# Patient Record
Sex: Female | Born: 1975 | Race: White | Hispanic: No | Marital: Married | Smoking: Former smoker
Health system: Southern US, Community
[De-identification: ages and names within clinical notes are randomized; demographics above are authoritative.]

## PROBLEM LIST (undated history)

## (undated) DIAGNOSIS — E559 Vitamin D deficiency, unspecified: Secondary | ICD-10-CM

## (undated) DIAGNOSIS — M1990 Unspecified osteoarthritis, unspecified site: Secondary | ICD-10-CM

## (undated) DIAGNOSIS — E785 Hyperlipidemia, unspecified: Secondary | ICD-10-CM

## (undated) DIAGNOSIS — G4730 Sleep apnea, unspecified: Secondary | ICD-10-CM

## (undated) DIAGNOSIS — R112 Nausea with vomiting, unspecified: Secondary | ICD-10-CM

## (undated) DIAGNOSIS — Z91018 Allergy to other foods: Secondary | ICD-10-CM

## (undated) DIAGNOSIS — M25569 Pain in unspecified knee: Secondary | ICD-10-CM

## (undated) DIAGNOSIS — T4145XA Adverse effect of unspecified anesthetic, initial encounter: Secondary | ICD-10-CM

## (undated) DIAGNOSIS — F419 Anxiety disorder, unspecified: Secondary | ICD-10-CM

## (undated) DIAGNOSIS — E119 Type 2 diabetes mellitus without complications: Secondary | ICD-10-CM

## (undated) DIAGNOSIS — N3281 Overactive bladder: Secondary | ICD-10-CM

## (undated) DIAGNOSIS — Z9889 Other specified postprocedural states: Secondary | ICD-10-CM

## (undated) DIAGNOSIS — I10 Essential (primary) hypertension: Secondary | ICD-10-CM

## (undated) DIAGNOSIS — F32A Depression, unspecified: Secondary | ICD-10-CM

## (undated) DIAGNOSIS — T8859XA Other complications of anesthesia, initial encounter: Secondary | ICD-10-CM

## (undated) DIAGNOSIS — F909 Attention-deficit hyperactivity disorder, unspecified type: Secondary | ICD-10-CM

## (undated) DIAGNOSIS — M2550 Pain in unspecified joint: Secondary | ICD-10-CM

## (undated) DIAGNOSIS — M549 Dorsalgia, unspecified: Secondary | ICD-10-CM

## (undated) DIAGNOSIS — F329 Major depressive disorder, single episode, unspecified: Secondary | ICD-10-CM

## (undated) DIAGNOSIS — K219 Gastro-esophageal reflux disease without esophagitis: Secondary | ICD-10-CM

## (undated) DIAGNOSIS — R002 Palpitations: Secondary | ICD-10-CM

## (undated) HISTORY — DX: Dorsalgia, unspecified: M54.9

## (undated) HISTORY — DX: Hyperlipidemia, unspecified: E78.5

## (undated) HISTORY — DX: Essential (primary) hypertension: I10

## (undated) HISTORY — DX: Type 2 diabetes mellitus without complications: E11.9

## (undated) HISTORY — DX: Pain in unspecified joint: M25.50

## (undated) HISTORY — PX: COLONOSCOPY: SHX174

## (undated) HISTORY — DX: Palpitations: R00.2

## (undated) HISTORY — DX: Pain in unspecified knee: M25.569

## (undated) HISTORY — PX: CHOLECYSTECTOMY: SHX55

## (undated) HISTORY — DX: Unspecified osteoarthritis, unspecified site: M19.90

## (undated) HISTORY — DX: Gastro-esophageal reflux disease without esophagitis: K21.9

## (undated) HISTORY — DX: Attention-deficit hyperactivity disorder, unspecified type: F90.9

## (undated) HISTORY — DX: Allergy to other foods: Z91.018

## (undated) HISTORY — PX: LAPAROSCOPY: SHX197

## (undated) HISTORY — DX: Sleep apnea, unspecified: G47.30

## (undated) HISTORY — DX: Vitamin D deficiency, unspecified: E55.9

## (undated) HISTORY — PX: OTHER SURGICAL HISTORY: SHX169

---

## 1997-12-31 ENCOUNTER — Other Ambulatory Visit: Admission: RE | Admit: 1997-12-31 | Discharge: 1997-12-31 | Payer: Self-pay | Admitting: Internal Medicine

## 1998-12-14 ENCOUNTER — Other Ambulatory Visit: Admission: RE | Admit: 1998-12-14 | Discharge: 1998-12-14 | Payer: Self-pay | Admitting: Internal Medicine

## 1999-04-29 ENCOUNTER — Other Ambulatory Visit: Admission: RE | Admit: 1999-04-29 | Discharge: 1999-04-29 | Payer: Self-pay | Admitting: Obstetrics & Gynecology

## 1999-04-29 ENCOUNTER — Encounter (INDEPENDENT_AMBULATORY_CARE_PROVIDER_SITE_OTHER): Payer: Self-pay

## 1999-06-17 ENCOUNTER — Ambulatory Visit (HOSPITAL_COMMUNITY): Admission: RE | Admit: 1999-06-17 | Discharge: 1999-06-17 | Payer: Self-pay | Admitting: Obstetrics & Gynecology

## 1999-06-17 ENCOUNTER — Encounter (INDEPENDENT_AMBULATORY_CARE_PROVIDER_SITE_OTHER): Payer: Self-pay

## 1999-12-16 ENCOUNTER — Other Ambulatory Visit: Admission: RE | Admit: 1999-12-16 | Discharge: 1999-12-16 | Payer: Self-pay | Admitting: Obstetrics & Gynecology

## 2000-12-20 ENCOUNTER — Other Ambulatory Visit: Admission: RE | Admit: 2000-12-20 | Discharge: 2000-12-20 | Payer: Self-pay | Admitting: Obstetrics & Gynecology

## 2003-02-26 ENCOUNTER — Encounter: Payer: Self-pay | Admitting: Obstetrics & Gynecology

## 2003-02-26 ENCOUNTER — Ambulatory Visit (HOSPITAL_COMMUNITY): Admission: RE | Admit: 2003-02-26 | Discharge: 2003-02-26 | Payer: Self-pay | Admitting: Obstetrics & Gynecology

## 2003-04-14 ENCOUNTER — Other Ambulatory Visit: Admission: RE | Admit: 2003-04-14 | Discharge: 2003-04-14 | Payer: Self-pay | Admitting: Obstetrics and Gynecology

## 2004-12-29 ENCOUNTER — Ambulatory Visit: Payer: Self-pay | Admitting: Family Medicine

## 2005-03-31 ENCOUNTER — Ambulatory Visit: Payer: Self-pay | Admitting: Family Medicine

## 2005-05-03 ENCOUNTER — Ambulatory Visit: Payer: Self-pay | Admitting: Family Medicine

## 2005-07-07 ENCOUNTER — Ambulatory Visit: Payer: Self-pay | Admitting: Family Medicine

## 2005-10-11 ENCOUNTER — Ambulatory Visit: Payer: Self-pay | Admitting: Family Medicine

## 2005-10-17 ENCOUNTER — Ambulatory Visit: Payer: Self-pay | Admitting: Family Medicine

## 2006-02-14 ENCOUNTER — Ambulatory Visit: Payer: Self-pay | Admitting: Family Medicine

## 2006-03-22 ENCOUNTER — Ambulatory Visit: Payer: Self-pay | Admitting: Family Medicine

## 2006-07-19 ENCOUNTER — Ambulatory Visit (HOSPITAL_COMMUNITY): Admission: RE | Admit: 2006-07-19 | Discharge: 2006-07-19 | Payer: Self-pay | Admitting: Family Medicine

## 2006-08-23 ENCOUNTER — Ambulatory Visit: Payer: Self-pay | Admitting: Family Medicine

## 2006-11-29 ENCOUNTER — Ambulatory Visit: Payer: Self-pay | Admitting: Family Medicine

## 2007-01-31 ENCOUNTER — Ambulatory Visit (HOSPITAL_COMMUNITY): Admission: RE | Admit: 2007-01-31 | Discharge: 2007-01-31 | Payer: Self-pay | Admitting: Family Medicine

## 2007-08-08 ENCOUNTER — Ambulatory Visit (HOSPITAL_COMMUNITY): Admission: RE | Admit: 2007-08-08 | Discharge: 2007-08-08 | Payer: Self-pay | Admitting: Family Medicine

## 2007-10-03 ENCOUNTER — Ambulatory Visit (HOSPITAL_COMMUNITY): Admission: RE | Admit: 2007-10-03 | Discharge: 2007-10-03 | Payer: Self-pay | Admitting: *Deleted

## 2008-01-23 ENCOUNTER — Ambulatory Visit (HOSPITAL_COMMUNITY): Admission: RE | Admit: 2008-01-23 | Discharge: 2008-01-23 | Payer: Self-pay | Admitting: Podiatry

## 2009-01-08 ENCOUNTER — Encounter: Admission: RE | Admit: 2009-01-08 | Discharge: 2009-01-08 | Payer: Self-pay | Admitting: Orthopaedic Surgery

## 2009-01-27 ENCOUNTER — Encounter: Admission: RE | Admit: 2009-01-27 | Discharge: 2009-04-27 | Payer: Self-pay | Admitting: Orthopaedic Surgery

## 2009-02-14 ENCOUNTER — Emergency Department (HOSPITAL_BASED_OUTPATIENT_CLINIC_OR_DEPARTMENT_OTHER): Admission: EM | Admit: 2009-02-14 | Discharge: 2009-02-14 | Payer: Self-pay | Admitting: Emergency Medicine

## 2009-02-16 ENCOUNTER — Emergency Department (HOSPITAL_BASED_OUTPATIENT_CLINIC_OR_DEPARTMENT_OTHER): Admission: EM | Admit: 2009-02-16 | Discharge: 2009-02-16 | Payer: Self-pay | Admitting: Emergency Medicine

## 2010-08-22 ENCOUNTER — Encounter: Payer: Self-pay | Admitting: Family Medicine

## 2010-11-07 LAB — GLUCOSE, CAPILLARY: Glucose-Capillary: 133 mg/dL — ABNORMAL HIGH (ref 70–99)

## 2010-12-14 NOTE — Op Note (Signed)
NAMEMarland Rogers  CHARIKA, MIKELSON                ACCOUNT NO.:  [id number]   MEDICAL RECORD NO.:  [id number]          PATIENT TYPE:  AMB   LOCATION:  SDC                           FACILITY:  [facility]   PHYSICIAN:  Vaughn B. Earlene Plater, M.D.  DATE OF BIRTH:  1975/12/03   DATE OF PROCEDURE:  10/03/2007  DATE OF DISCHARGE:                               OPERATIVE REPORT   PREOPERATIVE DIAGNOSIS:  Pelvic pain, dysmenorrhea.   POSTOPERATIVE DIAGNOSIS:  Pelvic pain, dysmenorrhea.   PROCEDURE:  Diagnostic laparoscopy.   SURGEON:  Marina Gravel, M.D.   ANESTHESIA:  General.   FINDINGS:  Normal-appearing pelvis and upper abdomen except one  questionable old implant in the posterior cul-de-sac.   SPECIMENS:  None.   BLOOD LOSS:  Minimal.   COMPLICATIONS:  None.   INDICATIONS:  Patient with above history requesting surgical diagnosis  and potential treatment if endometriosis is found.  The patient advised  of the risks of surgery include infection, bleeding, damage to  surrounding organs.   PROCEDURE:  The patient is the operating room and general anesthesia  obtained.  She is prepped and draped in standard fashion.  Foley placed  in the bladder.  Hulka tenaculum attached to the anterior lip of the  cervix.   10 mm incision placed in umbilicus and carried sharply to the fascia.  The fascia was divided sharply and elevated.  Posterior sheath and  peritoneum were elevated and entered sharply.  Pursestring suture of 0-0  Vicryl placed around the fascial defect.  Hasson cannula inserted and  secured.  Pneumoperitoneum obtained with CO2 gas.  Trendelenburg  position obtained.  5 mm trocar placed in the left lower quadrant under  direct laparoscopic visualization.  Bowel was mobilized superiorly.  Abdomen and pelvis inspected with above findings noted.  The tubes  appeared normal as did the fimbria.  The ovaries were normal.  Anterior  and posterior cul-de-sac were normal other than one questionable old  scarred implant of endometriosis in the posterior cul-de-sac.  No  evidence of active disease appendix appeared normal.  Upper abdomen  appeared normal.  Given no active lesions, no other pathology  identified, the procedure was terminated.  The inferior port was removed  and site was hemostatic.  Scope was removed.  Gas released.  Hasson  cannula removed.  Umbilical incision elevated with [employer] retractors.  Pursestring suture snugged down.  This obliterated the fascial defect  and no intra-abdominal contents herniated through prior to closure.  Skin was closed with 4-0 Vicryl and Dermabond.   The Hulka tenaculum removed and site was hemostatic.   The patient tolerated procedure well.  No complications.  She was taken  to recovery room, awake, alert in stable condition.      Gerri Spore B. Earlene Plater, M.D.  Electronically Signed     WBD/MEDQ  D:  10/03/2007  T:  10/03/2007  Job:  [id number]

## 2011-04-25 LAB — BASIC METABOLIC PANEL
BUN: 10
Chloride: 105
GFR calc non Af Amer: >60
Potassium: 3.7
Sodium: 139

## 2011-04-25 LAB — CBC
HCT: 39.8
Hemoglobin: 13.7
RBC: 4.56

## 2011-06-10 ENCOUNTER — Ambulatory Visit: Payer: BC Managed Care – PPO | Attending: Family Medicine | Admitting: Sleep Medicine

## 2011-06-10 DIAGNOSIS — G4733 Obstructive sleep apnea (adult) (pediatric): Secondary | ICD-10-CM | POA: Insufficient documentation

## 2011-06-10 DIAGNOSIS — Z6841 Body Mass Index (BMI) 40.0 and over, adult: Secondary | ICD-10-CM | POA: Insufficient documentation

## 2011-06-10 DIAGNOSIS — G4730 Sleep apnea, unspecified: Secondary | ICD-10-CM

## 2011-06-16 NOTE — Procedures (Signed)
Carla Rogers, Carla Rogers                ACCOUNT NO.:  [id number]  MEDICAL RECORD NO.:  [id number]          PATIENT TYPE:  OUT  LOCATION:  SLEEP LAB                     FACILITY:  [facility]  PHYSICIAN:  Kassidy Frankson A. Gerilyn Pilgrim, M.D. DATE OF BIRTH:  [birth year]  DATE OF STUDY:  06/10/2011                           NOCTURNAL POLYSOMNOGRAM  REFERRING PHYSICIAN:  Delaney Meigs, M.D.  INDICATION:  A 35 year old who presents with hypersomnia, fatigue, and snoring.  The study is being done to evaluate for obstructive sleep apnea syndrome.   EPWORTH SLEEPINESS SCORE:  11.  BMI:  49.  ARCHITECTURAL SUMMARY:  This is a split night recording with the initial portion being a diagnostic study and the second portion a titration recording.  The total recording time is 413 minutes.  Sleep efficiency 74%.  Sleep latency 16 minutes.  REM latency 235 minutes.  MEDICATIONS:  Metformin, lisinopril, Niaspan, Lexapro, multivitamin, calcium, vitamin C, vitamin B12, fish oil.  SLEEP ARCHITECTURE:  RESPIRATORY DATA:  Baseline oxygen saturation is 93, lowest saturation 80 during non-REM sleep.  Diagnostic AHI is 81.  The patient was placed on positive pressure and titrated between pressures of 5 and 9.  Optimal pressure is 9 with resolution of obstructive events and good tolerance.  LIMB MOVEMENT SUMMARY:  PLM index 0.  ELECTROCARDIOGRAM SUMMARY:  Average heart rate is 79 with no significant dysrhythmias observed.  IMPRESSION:  Severe obstructive sleep apnea syndrome which responds well to a CPAP of 9.  Thanks for this referral.    Cass Vandermeulen A. Gerilyn Pilgrim, M.D.    KAD/MEDQ  D:  06/16/2011 08:33:40  T:  06/16/2011 09:30:18  Job:  [id number]

## 2011-08-31 ENCOUNTER — Other Ambulatory Visit: Payer: Self-pay | Admitting: Orthopaedic Surgery

## 2011-08-31 DIAGNOSIS — M25552 Pain in left hip: Secondary | ICD-10-CM

## 2011-09-05 ENCOUNTER — Other Ambulatory Visit: Payer: Self-pay | Admitting: Orthopaedic Surgery

## 2011-09-05 DIAGNOSIS — M25552 Pain in left hip: Secondary | ICD-10-CM

## 2011-09-06 ENCOUNTER — Other Ambulatory Visit: Payer: BC Managed Care – PPO

## 2011-09-12 ENCOUNTER — Other Ambulatory Visit: Payer: Self-pay | Admitting: Orthopaedic Surgery

## 2011-09-12 ENCOUNTER — Ambulatory Visit
Admission: RE | Admit: 2011-09-12 | Discharge: 2011-09-12 | Disposition: A | Discharge status: Home / Self Care | Payer: BC Managed Care – PPO | Source: Ambulatory Visit | Attending: Orthopaedic Surgery | Admitting: Orthopaedic Surgery

## 2011-09-12 DIAGNOSIS — M25552 Pain in left hip: Secondary | ICD-10-CM

## 2013-12-25 ENCOUNTER — Other Ambulatory Visit (HOSPITAL_COMMUNITY): Payer: Self-pay | Admitting: Obstetrics & Gynecology

## 2013-12-25 DIAGNOSIS — Z1231 Encounter for screening mammogram for malignant neoplasm of breast: Secondary | ICD-10-CM

## 2014-01-06 ENCOUNTER — Ambulatory Visit (HOSPITAL_COMMUNITY)
Admission: RE | Admit: 2014-01-06 | Discharge: 2014-01-06 | Disposition: A | Discharge status: Home / Self Care | Payer: BC Managed Care – PPO | Source: Ambulatory Visit | Attending: Obstetrics & Gynecology | Admitting: Obstetrics & Gynecology

## 2014-01-06 DIAGNOSIS — Z1231 Encounter for screening mammogram for malignant neoplasm of breast: Secondary | ICD-10-CM | POA: Insufficient documentation

## 2015-01-29 ENCOUNTER — Other Ambulatory Visit: Payer: Self-pay | Admitting: Surgery

## 2015-02-10 ENCOUNTER — Ambulatory Visit: Payer: Self-pay | Admitting: Dietician

## 2015-02-17 ENCOUNTER — Encounter: Payer: BLUE CROSS/BLUE SHIELD | Attending: Surgery | Admitting: Dietician

## 2015-02-17 DIAGNOSIS — Z01818 Encounter for other preprocedural examination: Secondary | ICD-10-CM | POA: Diagnosis not present

## 2015-02-17 DIAGNOSIS — Z713 Dietary counseling and surveillance: Secondary | ICD-10-CM | POA: Insufficient documentation

## 2015-02-17 DIAGNOSIS — Z6841 Body Mass Index (BMI) 40.0 and over, adult: Secondary | ICD-10-CM | POA: Insufficient documentation

## 2015-02-17 NOTE — Progress Notes (Signed)
  Pre-Op Assessment Visit:  Pre-Operative Sleeve Gastrectomy Surgery  Medical Nutrition Therapy:  Appt start time: 1125   End time:  1225.  Patient was seen on 02/17/2015 for Pre-Operative Nutrition Assessment. Assessment and [facility] on 02/18/2015.   Preferred Learning Style:   No preference indicated   Learning Readiness:  Ready  Handouts given during visit include:  Pre-Op Goals Bariatric Surgery Protein Shakes   During the appointment today the following Pre-Op Goals were reviewed with the patient: Maintain or lose weight as instructed by your surgeon Make healthy food choices Begin to limit portion sizes Limited concentrated sugars and fried foods Keep fat/sugar in the single digits per serving on   food labels Practice CHEWING your food  (aim for 30 chews per bite or until applesauce consistency) Practice not drinking 15 minutes before, during, and 30 minutes after each meal/snack Avoid all carbonated beverages  Avoid/limit caffeinated beverages  Avoid all sugar-sweetened beverages Consume 3 meals per day; eat every 3-5 hours Make a list of non-food related activities Aim for 64-100 ounces of FLUID daily  Aim for at least 60-80 grams of PROTEIN daily Look for a liquid protein source that contain ?15 g protein and ?5 g carbohydrate  (ex: shakes, drinks, shots)  Patient-Centered Goals: -Overall health -More energy -More confidence -Shop for regular-sized clothes -Less back pain -Bending over comfortably -Healthy for kids -Running  Demonstrated degree of understanding via:  Teach Back  Teaching Method Utilized:  Visual Auditory Hands on  Barriers to learning/adherence to lifestyle change: none  Patient to call the [facility] to enroll in Pre-Op and Post-Op Nutrition Education when surgery date is scheduled.

## 2015-02-18 ENCOUNTER — Encounter: Payer: Self-pay | Admitting: Dietician

## 2015-02-23 ENCOUNTER — Ambulatory Visit (HOSPITAL_COMMUNITY)
Admission: RE | Admit: 2015-02-23 | Discharge: 2015-02-23 | Disposition: A | Discharge status: Home / Self Care | Payer: BLUE CROSS/BLUE SHIELD | Source: Ambulatory Visit | Attending: Surgery | Admitting: Surgery

## 2015-02-23 DIAGNOSIS — Z6841 Body Mass Index (BMI) 40.0 and over, adult: Secondary | ICD-10-CM | POA: Diagnosis not present

## 2015-02-23 DIAGNOSIS — K219 Gastro-esophageal reflux disease without esophagitis: Secondary | ICD-10-CM | POA: Diagnosis not present

## 2015-02-23 DIAGNOSIS — E119 Type 2 diabetes mellitus without complications: Secondary | ICD-10-CM | POA: Insufficient documentation

## 2015-02-23 DIAGNOSIS — I10 Essential (primary) hypertension: Secondary | ICD-10-CM | POA: Diagnosis not present

## 2015-02-23 DIAGNOSIS — G4730 Sleep apnea, unspecified: Secondary | ICD-10-CM | POA: Insufficient documentation

## 2015-03-02 ENCOUNTER — Encounter: Payer: BLUE CROSS/BLUE SHIELD | Attending: Surgery

## 2015-03-02 ENCOUNTER — Ambulatory Visit: Payer: Self-pay | Admitting: Dietician

## 2015-03-02 DIAGNOSIS — Z01818 Encounter for other preprocedural examination: Secondary | ICD-10-CM | POA: Diagnosis present

## 2015-03-02 DIAGNOSIS — Z713 Dietary counseling and surveillance: Secondary | ICD-10-CM | POA: Diagnosis not present

## 2015-03-02 DIAGNOSIS — Z6841 Body Mass Index (BMI) 40.0 and over, adult: Secondary | ICD-10-CM | POA: Diagnosis not present

## 2015-03-03 NOTE — Progress Notes (Signed)
  Pre-Operative Nutrition Class:  Appt start time: 830   End time:  930.  Patient was seen on 03/02/2015 for Pre-Operative Bariatric Surgery Education at the [facility].   Surgery date:  Surgery type: Sleeve gastrectomy Start weight at [facility]: 252.5 on 02/18/15 Weight today: 253.5 lbs  TANITA  BODY COMP RESULTS  03/02/15   BMI (kg/m^2) 47.9   Fat Mass (lbs) 129.5   Fat Free Mass (lbs) 124   Total Body Water (lbs) 91   Samples given per MNT protocol. Patient educated on appropriate usage: Premier protein shake (chocolate - qty 1) Lot #: [id number] Exp: 10/2015  Unjury protein powder (chicken soup - qty 1) Lot #: [id number] Exp: 01/2016  Celebrate Calcium citrate chew (caramel - qty 1) Lot #: [id number] Exp: 09/2016  The following the learning objectives were met by the patient during this course:  Identify Pre-Op Dietary Goals and will begin 2 weeks pre-operatively  Identify appropriate sources of fluids and proteins   State protein recommendations and appropriate sources pre and post-operatively  Identify Post-Operative Dietary Goals and will follow for 2 weeks post-operatively  Identify appropriate multivitamin and calcium sources  Describe the need for physical activity post-operatively and will follow MD recommendations  State when to call healthcare provider regarding medication questions or post-operative complications  Handouts given during class include:  Pre-Op Bariatric Surgery Diet Handout  Protein Shake Handout  Post-Op Bariatric Surgery Nutrition Handout  BELT Program Information Flyer  Support Group Information Flyer  [facility] Bariatric Supplements Price List  Follow-Up Plan: Patient will follow-up at [facility] 2 weeks post operatively for diet advancement per MD.

## 2015-03-16 NOTE — Patient Instructions (Signed)
MAILANI DEGROOTE  03/16/2015   Your procedure is scheduled on: 03/24/15    Report to [facility] Main  Entrance take [place]  elevators to 3rd floor to  Short Stay Center at   1130 AM.  Call this number if you have problems the morning of surgery [phone]   Remember: ONLY 1 PERSON MAY GO WITH YOU TO SHORT STAY TO GET  READY MORNING OF YOUR SURGERY.  Do not eat food or drink liquids :After Midnight.     Take these medicines the morning of surgery with A SIP OF WATER:   Zyban, Lexapro, Lorazepam ( Ativan), Prilosec, Vesicare                               You may not have any metal on your body including hair pins and              piercings  Do not wear jewelry, make-up, lotions, powders or perfumes, deodorant             Do not wear nail polish.  Do not shave  48 hours prior to surgery.                 Do not bring valuables to the hospital. [facility] IS NOT             RESPONSIBLE   FOR VALUABLES.  Contacts, dentures or bridgework may not be worn into surgery.  Leave suitcase in the car. After surgery it may be brought to your room.       Special Instructions: coughing and deep breathing exercises, leg exercises               Please read over the following fact sheets you were given: _____________________________________________________________________             [facility] - Preparing for Surgery Before surgery, you can play an important role.  Because skin is not sterile, your skin needs to be as free of germs as possible.  You can reduce the number of germs on your skin by washing with CHG (chlorahexidine gluconate) soap before surgery.  CHG is an antiseptic cleaner which kills germs and bonds with the skin to continue killing germs even after washing. Please DO NOT use if you have an allergy to CHG or antibacterial soaps.  If your skin becomes reddened/irritated stop using the CHG and inform your nurse when you arrive at Short Stay. Do not shave  (including legs and underarms) for at least 48 hours prior to the first CHG shower.  You may shave your face/neck. Please follow these instructions carefully:  1.  Shower with CHG Soap the night before surgery and the  morning of Surgery.  2.  If you choose to wash your hair, wash your hair first as usual with your  normal  shampoo.  3.  After you shampoo, rinse your hair and body thoroughly to remove the  shampoo.                           4.  Use CHG as you would any other liquid soap.  You can apply chg directly  to the skin and wash  Gently with a scrungie or clean washcloth.  5.  Apply the CHG Soap to your body ONLY FROM THE NECK DOWN.   Do not use on face/ open                           Wound or open sores. Avoid contact with eyes, ears mouth and genitals (private parts).                       Wash face,  Genitals (private parts) with your normal soap.             6.  Wash thoroughly, paying special attention to the area where your surgery  will be performed.  7.  Thoroughly rinse your body with warm water from the neck down.  8.  DO NOT shower/wash with your normal soap after using and rinsing off  the CHG Soap.                9.  Pat yourself dry with a clean towel.            10.  Wear clean pajamas.            11.  Place clean sheets on your bed the night of your first shower and do not  sleep with pets. Day of Surgery : Do not apply any lotions/deodorants the morning of surgery.  Please wear clean clothes to the hospital/surgery center.  FAILURE TO FOLLOW THESE INSTRUCTIONS MAY RESULT IN THE CANCELLATION OF YOUR SURGERY PATIENT SIGNATURE_________________________________  NURSE SIGNATURE__________________________________  ________________________________________________________________________    CLEAR LIQUID DIET   Foods Allowed                                                                     Foods Excluded  Coffee and tea, regular and decaf                              liquids that you cannot  Plain Jell-O in any flavor                                             see through such as: Fruit ices (not with fruit pulp)                                     milk, soups, orange juice  Iced Popsicles                                    All solid food Carbonated beverages, regular and diet                                    Cranberry, grape and apple juices Sports drinks like Gatorade Lightly seasoned clear broth or consume(fat free) Sugar, honey syrup  Sample Menu Breakfast                                Lunch                                     Supper Cranberry juice                    Beef broth                            Chicken broth Jell-O                                     Grape juice                           Apple juice Coffee or tea                        Jell-O                                      Popsicle                                                Coffee or tea                        Coffee or tea  _____________________________________________________________________

## 2015-03-16 NOTE — Progress Notes (Signed)
Surgery on 03/24/15.  proep on 03/18/15.  Need orders in EPIC.  Thank You.

## 2015-03-17 ENCOUNTER — Other Ambulatory Visit (HOSPITAL_COMMUNITY): Payer: BLUE CROSS/BLUE SHIELD

## 2015-03-17 ENCOUNTER — Other Ambulatory Visit: Payer: Self-pay | Admitting: Surgery

## 2015-03-18 ENCOUNTER — Encounter (HOSPITAL_COMMUNITY): Payer: Self-pay

## 2015-03-18 ENCOUNTER — Encounter (HOSPITAL_COMMUNITY)
Admission: RE | Admit: 2015-03-18 | Discharge: 2015-03-18 | Disposition: A | Discharge status: Home / Self Care | Payer: BLUE CROSS/BLUE SHIELD | Source: Ambulatory Visit | Attending: Surgery | Admitting: Surgery

## 2015-03-18 DIAGNOSIS — Z01818 Encounter for other preprocedural examination: Secondary | ICD-10-CM | POA: Insufficient documentation

## 2015-03-18 HISTORY — DX: Other complications of anesthesia, initial encounter: T88.59XA

## 2015-03-18 HISTORY — DX: Overactive bladder: N32.81

## 2015-03-18 HISTORY — DX: Other specified postprocedural states: R11.2

## 2015-03-18 HISTORY — DX: Anxiety disorder, unspecified: F41.9

## 2015-03-18 HISTORY — DX: Nausea with vomiting, unspecified: Z98.890

## 2015-03-18 HISTORY — DX: Depression, unspecified: F32.A

## 2015-03-18 HISTORY — DX: Major depressive disorder, single episode, unspecified: F32.9

## 2015-03-18 HISTORY — DX: Adverse effect of unspecified anesthetic, initial encounter: T41.45XA

## 2015-03-18 LAB — CBC WITH DIFFERENTIAL/PLATELET
Basophils Absolute: 0 10*3/uL (ref 0.0–0.1)
Basophils Relative: 0 % (ref 0–1)
Eosinophils Absolute: 0.2 10*3/uL (ref 0.0–0.7)
Eosinophils Relative: 2 % (ref 0–5)
HEMATOCRIT: 39 % (ref 36.0–46.0)
Hemoglobin: 12.5 g/dL (ref 12.0–15.0)
LYMPHS ABS: 2.1 10*3/uL (ref 0.7–4.0)
LYMPHS PCT: 30 % (ref 12–46)
MCH: 27.7 pg (ref 26.0–34.0)
MCHC: 32.1 g/dL (ref 30.0–36.0)
MCV: 86.5 fL (ref 78.0–100.0)
MONO ABS: 0.6 10*3/uL (ref 0.1–1.0)
MONOS PCT: 8 % (ref 3–12)
NEUTROS ABS: 4.2 10*3/uL (ref 1.7–7.7)
Neutrophils Relative %: 60 % (ref 43–77)
Platelets: 210 10*3/uL (ref 150–400)
RBC: 4.51 MIL/uL (ref 3.87–5.11)
RDW: 14.6 % (ref 11.5–15.5)
WBC: 7.1 10*3/uL (ref 4.0–10.5)

## 2015-03-18 LAB — COMPREHENSIVE METABOLIC PANEL
ALT: 18 U/L (ref 14–54)
ANION GAP: 7 (ref 5–15)
AST: 19 U/L (ref 15–41)
Albumin: 4 g/dL (ref 3.5–5.0)
Alkaline Phosphatase: 46 U/L (ref 38–126)
BILIRUBIN TOTAL: 0.5 mg/dL (ref 0.3–1.2)
BUN: 15 mg/dL (ref 6–20)
CALCIUM: 9 mg/dL (ref 8.9–10.3)
CO2: 26 mmol/L (ref 22–32)
Chloride: 103 mmol/L (ref 101–111)
Creatinine, Ser: 0.61 mg/dL (ref 0.44–1.00)
GFR calc Af Amer: >60 mL/min (ref >60)
Glucose, Bld: 134 mg/dL — ABNORMAL HIGH (ref 65–99)
POTASSIUM: 3.6 mmol/L (ref 3.5–5.1)
Sodium: 136 mmol/L (ref 135–145)
TOTAL PROTEIN: 7 g/dL (ref 6.5–8.1)

## 2015-03-18 LAB — HCG, SERUM, QUALITATIVE: PREG SERUM: NEGATIVE

## 2015-03-18 NOTE — Progress Notes (Signed)
EKG- 02/23/15 in [facility]

## 2015-03-23 NOTE — H&P (Signed)
Carla Rogers 03/17/2015 12:16 PM Location: [facility] Patient #: [id number] DOB: 01-Jun-1976 Married / Language: English / Race: White Female  History of Present Illness   Patient words: preop bari.   The patient is a 39 year old female who presents for a bariatric surgery evaluation.   Her PCP is Dr. Jearld Lesch.  She is here by herself.   They are putting on new flooring at home and her husband is at home. She has started her diet. She has only lost 3 more pounds, but she lost about 50 pounds to this point with all that she has gone through. She understands the surgery and plans.  For psych she saw Cheryle Horsfall 11/11/2014 [facility] - 02/23/2015 - Negative  History of weight problems: She is under some stress - because she was set up have surgery through [facility], but they are not [place] - so she is set up over her. She has lost about 40 pounds - 297 to 253 in getting ready for surgery. She was looking to have surgery by [facility] and I have records from Dr. Kirtland Bouchard. [facility]. She has finished her workup for them. She has tried multiple diets. She has found that she is a carboholic. She used [place] and high protein diet to lose from 297 to 253. She listened to our infromation session on line. She has a sister in law who has a lap band and has lost 50 pounds. She has several family members who have had gastric bypass.  Per the 1991 NIH Consensus Statement, the patient is a candidate for bariatric surgery. The patient attended our initial information session and reviewed the types of bariatric surgery.  The patient is interested in the sleeve gastrectomy. I discussed with the patient the indications and risks of bariatric surgery. The potential risks of surgery include, but are not limited to, bleeding, infection, leak from the bowel, DVT and PE, open surgery, long term nutrition consequences, and death. The patient understands the importance of  compliance and long term follow-up with our group after surgery.  Past Medical History: 1. OSA On CPAP - set up in [place]. But the physician who set her up has left, so she is not sure whom will follow her. 2. DM x 12 years On oral hypoglycemics Hgb A1C - 6.1 - 09/30/2014 3. HTN 4. GERD Omeprazole 5. Recent colonoscopy April 2016 - 2 polyps, but next colonoscopy in 10 years. 6. Psych - [facility] 7. Nutritionist - [facility] 8. I had incorrectly said that she had had a cholecystecotmy - she has not  Social History: Married (her husband has seizures - has had "lazer" ablation and is on multiple meds) She has 2 children: 55 yo son and 25 yo daughter (both are adopted) She works has foster parent. 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 She is taking on line courses through [employer] for [employer], [address]. She hopes to use this for her foster care   Addendum Note(Cebastian Neis H. Ezzard Standing MD; 03/17/2015 12:58 PM) Discussed Zofran and Lexapro interaction with the patient. She had Zofran before, but cannot remember if she was on Lexapro at the time. She is on Lexapro and Wellbutrin for depression and anxiety. DN 03/17/2015    Allergies (Ammie Eversole, LPN; 0/45/4098 11:91 PM) Sulf-10 *OPHTHALMIC AGENTS* Codeine Phosphate *ANALGESICS - OPIOID* Macrobid *Urinary Anti-infectives  Medication History (Ammie Eversole, LPN; 4/78/2956 21:30 PM) VESIcare (   Tablet, Oral) Active. Omeprazole (  Capsule DR, Oral) Active. MetFORMIN HCl ER (  Tablet ER 24HR, Oral) Active. Crestor (  Tablet, Oral) Active. BuPROPion HCl ER (XL) (  Tablet ER 24HR, Oral) Active. Lisinopril (  Tablet, Oral) Active. Escitalopram Oxalate (  Tablet, Oral) Active. Montelukast Sodium (  Tablet, Oral) Active. LORazepam (  Tablet, Oral) Active. Mirena (20MCG/24HR IUD,  Intrauterine) Active. Medications Reconciled  Review of Systems Onalee Hua H. Ezzard Standing MD; 03/17/2015 12:23 PM) General Not Present- Appetite Loss, Chills, Fatigue, Fever, Night Sweats, Weight Gain and Weight Loss. Skin Not Present- Change in Wart/Mole, Dryness, Hives, Jaundice, New Lesions, Non-Healing Wounds, Rash and Ulcer. HEENT Present- Seasonal Allergies and Wears glasses/contact lenses. Not Present- Earache, Hearing Loss, Hoarseness, Nose Bleed, Oral Ulcers, Ringing in the Ears, Sinus Pain, Sore Throat, Visual Disturbances and Yellow Eyes. Respiratory Present- Snoring. Not Present- Bloody sputum, Chronic Cough, Difficulty Breathing and Wheezing. Breast Not Present- Breast Mass, Breast Pain, Nipple Discharge and Skin Changes. Cardiovascular Not Present- Chest Pain, Difficulty Breathing Lying Down, Leg Cramps, Palpitations, Rapid Heart Rate, Shortness of Breath and Swelling of Extremities. Gastrointestinal Not Present- Abdominal Pain, Bloating, Bloody Stool, Change in Bowel Habits, Chronic diarrhea, Constipation, Difficulty Swallowing, Excessive gas, Gets full quickly at meals, Hemorrhoids, Indigestion, Nausea, Rectal Pain and Vomiting. Female Genitourinary Present- Frequency and Urgency. Not Present- Nocturia, Painful Urination and Pelvic Pain. Musculoskeletal Present- Back Pain and Muscle Pain. Not Present- Joint Pain, Joint Stiffness, Muscle Weakness and Swelling of Extremities. Neurological Not Present- Decreased Memory, Fainting, Headaches, Numbness, Seizures, Tingling, Tremor, Trouble walking and Weakness. Psychiatric Present- Anxiety and Depression. Not Present- Bipolar, Change in Sleep Pattern, Fearful and Frequent crying. Endocrine Present- Heat Intolerance. Not Present- Cold Intolerance, Excessive Hunger, Hair Changes, Hot flashes and New Diabetes. Hematology Not Present- Easy Bruising, Excessive bleeding, Gland problems, HIV and Persistent Infections.   Vitals (Ammie Eversole LPN;  9/60/4540 12:19 PM) 03/17/2015 12:19 PM Weight: 250.8 lb Height: 61in Body Surface Area: 2.21 m Body Mass Index: 47.39 kg/m Temp.: 32F(Oral)  Pulse: 82 (Regular)  BP: 132/72 (Sitting, Left Arm, Standard)  Physical Exam  General: Obese WF alert and generally healthy appearing. Short black hair. HEENT: Normal. Pupils equal.  Neck: Supple. No mass. No thyroid mass.  Lymph Nodes: No supraclavicular or cervical nodes.  Lungs: Clear to auscultation and symmetric breath sounds. Heart: RRR. No murmur or rub.  Abdomen: Soft. No mass. No tenderness. No hernia. Normal bowel sounds. She is about 1/2 pear and 1/2 apple. She has laparoscopic incision from prior fertility work up.  Extremities: Good strength and ROM in upper and lower extremities.  Neurologic: Grossly intact to motor and sensory function. Psychiatric: Has normal mood and affect. Behavior is normal.  Assessment & Plan  1.  MORBID OBESITY WITH BMI OF 45.0-49.9, ADULT (278.01  E66.01)  Impression: For Lap sleeve - 03/24/2015   Post op meds given   OxyCODONE HCl /5ML, 5-10 Milliliter every six hours, as needed, 200 Milliliter, 03/17/2015, No Refill.   Zofran ODT , 1 (one) Tablet Disperse every eight hours, as needed, #20, 03/17/2015, No Refill.  2. OSA  On CPAP - set up in [place]. But the physician who set her up has left, so she is not sure whom will follow her. 3. DM x 12 years  On oral hypoglycemics  Hgb A1C - 6.1 - 09/30/2014 4. HTN 5. GERD Omeprazole  Ovidio Kin, MD, [facility] Pager: [phone] Office phone:  [phone]

## 2015-03-24 ENCOUNTER — Inpatient Hospital Stay (HOSPITAL_COMMUNITY): Payer: BLUE CROSS/BLUE SHIELD | Admitting: Certified Registered Nurse Anesthetist

## 2015-03-24 ENCOUNTER — Encounter (HOSPITAL_COMMUNITY)
Admission: AD | Disposition: A | Discharge status: Home / Self Care | Payer: Self-pay | Source: Ambulatory Visit | Attending: Surgery

## 2015-03-24 ENCOUNTER — Inpatient Hospital Stay (HOSPITAL_COMMUNITY)
Admission: AD | Admit: 2015-03-24 | Discharge: 2015-03-26 | DRG: 621 | Disposition: A | Discharge status: Home / Self Care | Payer: BLUE CROSS/BLUE SHIELD | Source: Ambulatory Visit | Attending: Surgery | Admitting: Surgery

## 2015-03-24 ENCOUNTER — Encounter (HOSPITAL_COMMUNITY): Payer: Self-pay | Admitting: Certified Registered Nurse Anesthetist

## 2015-03-24 DIAGNOSIS — Z01812 Encounter for preprocedural laboratory examination: Secondary | ICD-10-CM | POA: Diagnosis not present

## 2015-03-24 DIAGNOSIS — G4733 Obstructive sleep apnea (adult) (pediatric): Secondary | ICD-10-CM | POA: Diagnosis present

## 2015-03-24 DIAGNOSIS — F329 Major depressive disorder, single episode, unspecified: Secondary | ICD-10-CM | POA: Diagnosis present

## 2015-03-24 DIAGNOSIS — E119 Type 2 diabetes mellitus without complications: Secondary | ICD-10-CM | POA: Diagnosis present

## 2015-03-24 DIAGNOSIS — Z6841 Body Mass Index (BMI) 40.0 and over, adult: Secondary | ICD-10-CM

## 2015-03-24 DIAGNOSIS — I10 Essential (primary) hypertension: Secondary | ICD-10-CM | POA: Diagnosis present

## 2015-03-24 DIAGNOSIS — Z87891 Personal history of nicotine dependence: Secondary | ICD-10-CM | POA: Diagnosis not present

## 2015-03-24 DIAGNOSIS — Z79899 Other long term (current) drug therapy: Secondary | ICD-10-CM

## 2015-03-24 DIAGNOSIS — F419 Anxiety disorder, unspecified: Secondary | ICD-10-CM | POA: Diagnosis present

## 2015-03-24 DIAGNOSIS — K219 Gastro-esophageal reflux disease without esophagitis: Secondary | ICD-10-CM | POA: Diagnosis present

## 2015-03-24 DIAGNOSIS — Z9884 Bariatric surgery status: Secondary | ICD-10-CM

## 2015-03-24 HISTORY — PX: LAPAROSCOPIC GASTRIC SLEEVE RESECTION: SHX5895

## 2015-03-24 HISTORY — PX: UPPER GI ENDOSCOPY: SHX6162

## 2015-03-24 LAB — GLUCOSE, CAPILLARY
GLUCOSE-CAPILLARY: 127 mg/dL — AB (ref 65–99)
GLUCOSE-CAPILLARY: 145 mg/dL — AB (ref 65–99)
Glucose-Capillary: 103 mg/dL — ABNORMAL HIGH (ref 65–99)
Glucose-Capillary: 144 mg/dL — ABNORMAL HIGH (ref 65–99)

## 2015-03-24 LAB — PREGNANCY, URINE: Preg Test, Ur: NEGATIVE

## 2015-03-24 LAB — HEMOGLOBIN AND HEMATOCRIT, BLOOD
HCT: 38.4 % (ref 36.0–46.0)
Hemoglobin: 12.5 g/dL (ref 12.0–15.0)

## 2015-03-24 SURGERY — GASTRECTOMY, SLEEVE, LAPAROSCOPIC
Anesthesia: General

## 2015-03-24 MED ORDER — CEFOTETAN DISODIUM-DEXTROSE 2-2.08 GM-% IV SOLR
2.0000 g | INTRAVENOUS | Status: AC
  Administered 2015-03-24: 2 g via INTRAVENOUS

## 2015-03-24 MED ORDER — ONDANSETRON HCL 4 MG/2ML IJ SOLN
INTRAMUSCULAR | Status: AC
  Filled 2015-03-24: qty 2

## 2015-03-24 MED ORDER — LIDOCAINE HCL (CARDIAC) 20 MG/ML IV SOLN
INTRAVENOUS | Status: DC | PRN
  Administered 2015-03-24: 100 mg via INTRAVENOUS

## 2015-03-24 MED ORDER — PROMETHAZINE HCL 25 MG/ML IJ SOLN: 6.2500 mg | INTRAMUSCULAR | Status: DC | PRN

## 2015-03-24 MED ORDER — FENTANYL CITRATE (PF) 250 MCG/5ML IJ SOLN
INTRAMUSCULAR | Status: AC
  Filled 2015-03-24: qty 25

## 2015-03-24 MED ORDER — ROCURONIUM BROMIDE 100 MG/10ML IV SOLN
INTRAVENOUS | Status: DC | PRN
  Administered 2015-03-24: 10 mg via INTRAVENOUS
  Administered 2015-03-24: 50 mg via INTRAVENOUS
  Administered 2015-03-24: 10 mg via INTRAVENOUS

## 2015-03-24 MED ORDER — DEXAMETHASONE SODIUM PHOSPHATE 10 MG/ML IJ SOLN
INTRAMUSCULAR | Status: DC | PRN
  Administered 2015-03-24: 8 mg via INTRAVENOUS

## 2015-03-24 MED ORDER — HYDROMORPHONE HCL 2 MG/ML IJ SOLN
INTRAMUSCULAR | Status: AC
  Filled 2015-03-24: qty 1

## 2015-03-24 MED ORDER — SUGAMMADEX SODIUM 500 MG/5ML IV SOLN
INTRAVENOUS | Status: DC | PRN
  Administered 2015-03-24: 250 mg via INTRAVENOUS

## 2015-03-24 MED ORDER — UNJURY CHOCOLATE CLASSIC POWDER
2.0000 [oz_av] | Freq: Four times a day (QID) | ORAL | Status: DC
  Administered 2015-03-26: 2 [oz_av] via ORAL

## 2015-03-24 MED ORDER — TISSEEL VH 10 ML EX KIT
PACK | CUTANEOUS | Status: DC | PRN
  Administered 2015-03-24: 2
  Administered 2015-03-24: 1

## 2015-03-24 MED ORDER — LACTATED RINGERS IV SOLN
INTRAVENOUS | Status: DC
  Administered 2015-03-24: 16:00:00 via INTRAVENOUS
  Administered 2015-03-24: 1000 mL via INTRAVENOUS
  Administered 2015-03-24 (×2): via INTRAVENOUS

## 2015-03-24 MED ORDER — 0.9 % SODIUM CHLORIDE (POUR BTL) OPTIME
TOPICAL | Status: DC | PRN
  Administered 2015-03-24: 1000 mL

## 2015-03-24 MED ORDER — ACETAMINOPHEN 160 MG/5ML PO SOLN: 650.0000 mg | ORAL | Status: DC | PRN

## 2015-03-24 MED ORDER — CETYLPYRIDINIUM CHLORIDE 0.05 % MT LIQD
7.0000 mL | Freq: Two times a day (BID) | OROMUCOSAL | Status: DC
  Administered 2015-03-25 – 2015-03-26 (×2): 7 mL via OROMUCOSAL

## 2015-03-24 MED ORDER — PROPOFOL 10 MG/ML IV BOLUS
INTRAVENOUS | Status: DC | PRN
  Administered 2015-03-24: 200 mg via INTRAVENOUS
  Administered 2015-03-24: 60 mg via INTRAVENOUS

## 2015-03-24 MED ORDER — CHLORHEXIDINE GLUCONATE 4 % EX LIQD: 60.0000 mL | Freq: Once | CUTANEOUS | Status: DC

## 2015-03-24 MED ORDER — LABETALOL HCL 5 MG/ML IV SOLN
INTRAVENOUS | Status: DC | PRN
  Administered 2015-03-24 (×3): 2.5 mg via INTRAVENOUS

## 2015-03-24 MED ORDER — UNJURY CHICKEN SOUP POWDER: 2.0000 [oz_av] | Freq: Four times a day (QID) | ORAL | Status: DC

## 2015-03-24 MED ORDER — POTASSIUM CHLORIDE IN NACL 20-0.45 MEQ/L-% IV SOLN
INTRAVENOUS | Status: DC
  Administered 2015-03-24 – 2015-03-25 (×3): via INTRAVENOUS
  Administered 2015-03-25: 150 mL/h via INTRAVENOUS
  Administered 2015-03-25: 09:00:00 via INTRAVENOUS
  Administered 2015-03-26: 150 mL/h via INTRAVENOUS
  Filled 2015-03-24 (×7): qty 1000

## 2015-03-24 MED ORDER — CHLORHEXIDINE GLUCONATE 0.12 % MT SOLN
15.0000 mL | Freq: Two times a day (BID) | OROMUCOSAL | Status: DC
  Administered 2015-03-24 – 2015-03-26 (×4): 15 mL via OROMUCOSAL
  Filled 2015-03-24 (×5): qty 15

## 2015-03-24 MED ORDER — INSULIN ASPART 100 UNIT/ML ~~LOC~~ SOLN
0.0000 [IU] | SUBCUTANEOUS | Status: DC
  Administered 2015-03-24 (×2): 3 [IU] via SUBCUTANEOUS

## 2015-03-24 MED ORDER — GLYCOPYRROLATE 0.2 MG/ML IJ SOLN
INTRAMUSCULAR | Status: AC
  Filled 2015-03-24: qty 2

## 2015-03-24 MED ORDER — HEPARIN SODIUM (PORCINE) 5000 UNIT/ML IJ SOLN
5000.0000 [IU] | INTRAMUSCULAR | Status: AC
  Administered 2015-03-24: 5000 [IU] via SUBCUTANEOUS
  Filled 2015-03-24: qty 1

## 2015-03-24 MED ORDER — UNJURY VANILLA POWDER: 2.0000 [oz_av] | Freq: Four times a day (QID) | ORAL | Status: DC

## 2015-03-24 MED ORDER — ROCURONIUM BROMIDE 100 MG/10ML IV SOLN
INTRAVENOUS | Status: AC
  Filled 2015-03-24: qty 1

## 2015-03-24 MED ORDER — OXYCODONE HCL 5 MG/5ML PO SOLN
5.0000 mg | ORAL | Status: DC | PRN
  Administered 2015-03-25 (×2): 10 mg via ORAL
  Administered 2015-03-25: 5 mg via ORAL
  Administered 2015-03-26 (×2): 10 mg via ORAL
  Filled 2015-03-24: qty 10
  Filled 2015-03-24: qty 5
  Filled 2015-03-24: qty 10
  Filled 2015-03-24: qty 50
  Filled 2015-03-24: qty 10

## 2015-03-24 MED ORDER — BUPIVACAINE HCL (PF) 0.25 % IJ SOLN
INTRAMUSCULAR | Status: AC
  Filled 2015-03-24: qty 30

## 2015-03-24 MED ORDER — LACTATED RINGERS IR SOLN
Status: DC | PRN
  Administered 2015-03-24: 3000 mL

## 2015-03-24 MED ORDER — FENTANYL CITRATE (PF) 100 MCG/2ML IJ SOLN
25.0000 ug | INTRAMUSCULAR | Status: DC | PRN
  Administered 2015-03-24: 25 ug via INTRAVENOUS

## 2015-03-24 MED ORDER — TISSEEL VH 10 ML EX KIT
PACK | CUTANEOUS | Status: AC
  Filled 2015-03-24: qty 2

## 2015-03-24 MED ORDER — MIDAZOLAM HCL 2 MG/2ML IJ SOLN
INTRAMUSCULAR | Status: AC
  Filled 2015-03-24: qty 4

## 2015-03-24 MED ORDER — NEOSTIGMINE METHYLSULFATE 10 MG/10ML IV SOLN
INTRAVENOUS | Status: AC
  Filled 2015-03-24: qty 1

## 2015-03-24 MED ORDER — CHLORHEXIDINE GLUCONATE 4 % EX LIQD
60.0000 mL | Freq: Once | CUTANEOUS | Status: DC
Start: 2015-03-25 — End: 2015-03-24

## 2015-03-24 MED ORDER — HYDROMORPHONE HCL 1 MG/ML IJ SOLN
INTRAMUSCULAR | Status: DC | PRN
  Administered 2015-03-24: 1 mg via INTRAVENOUS

## 2015-03-24 MED ORDER — ARTIFICIAL TEARS OP OINT
TOPICAL_OINTMENT | OPHTHALMIC | Status: AC
Start: 2015-03-24 — End: 2015-03-24
  Filled 2015-03-24: qty 3.5

## 2015-03-24 MED ORDER — FENTANYL CITRATE (PF) 100 MCG/2ML IJ SOLN
INTRAMUSCULAR | Status: AC
  Filled 2015-03-24: qty 2

## 2015-03-24 MED ORDER — SUCCINYLCHOLINE CHLORIDE 20 MG/ML IJ SOLN
INTRAMUSCULAR | Status: DC | PRN
  Administered 2015-03-24: 100 mg via INTRAVENOUS

## 2015-03-24 MED ORDER — FENTANYL CITRATE (PF) 100 MCG/2ML IJ SOLN
INTRAMUSCULAR | Status: DC | PRN
  Administered 2015-03-24 (×5): 50 ug via INTRAVENOUS
  Administered 2015-03-24: 150 ug via INTRAVENOUS
  Administered 2015-03-24: 50 ug via INTRAVENOUS

## 2015-03-24 MED ORDER — LACTATED RINGERS IV SOLN: INTRAVENOUS | Status: DC

## 2015-03-24 MED ORDER — HEPARIN SODIUM (PORCINE) 5000 UNIT/ML IJ SOLN
5000.0000 [IU] | Freq: Three times a day (TID) | INTRAMUSCULAR | Status: DC
Start: 2015-03-24 — End: 2015-03-26
  Administered 2015-03-24 – 2015-03-26 (×5): 5000 [IU] via SUBCUTANEOUS
  Filled 2015-03-24 (×10): qty 1

## 2015-03-24 MED ORDER — MIDAZOLAM HCL 5 MG/5ML IJ SOLN
INTRAMUSCULAR | Status: DC | PRN
  Administered 2015-03-24: 2 mg via INTRAVENOUS

## 2015-03-24 MED ORDER — CEFOTETAN DISODIUM-DEXTROSE 2-2.08 GM-% IV SOLR
INTRAVENOUS | Status: AC
  Filled 2015-03-24: qty 50

## 2015-03-24 MED ORDER — PROPOFOL 10 MG/ML IV BOLUS
INTRAVENOUS | Status: AC
  Filled 2015-03-24: qty 20

## 2015-03-24 MED ORDER — ONDANSETRON HCL 4 MG/2ML IJ SOLN: 4.0000 mg | INTRAMUSCULAR | Status: DC | PRN

## 2015-03-24 MED ORDER — BUPIVACAINE HCL 0.25 % IJ SOLN
INTRAMUSCULAR | Status: DC | PRN
Start: 2015-03-24 — End: 2015-03-24
  Administered 2015-03-24 (×2): 30 mL

## 2015-03-24 MED ORDER — ACETAMINOPHEN 160 MG/5ML PO SOLN: 325.0000 mg | ORAL | Status: DC | PRN

## 2015-03-24 MED ORDER — ONDANSETRON HCL 4 MG/2ML IJ SOLN
INTRAMUSCULAR | Status: DC | PRN
  Administered 2015-03-24: 4 mg via INTRAVENOUS

## 2015-03-24 MED ORDER — MORPHINE SULFATE (PF) 2 MG/ML IV SOLN
2.0000 mg | INTRAVENOUS | Status: DC | PRN
  Administered 2015-03-24 – 2015-03-25 (×7): 4 mg via INTRAVENOUS
  Filled 2015-03-24 (×7): qty 2

## 2015-03-24 MED ORDER — DEXAMETHASONE SODIUM PHOSPHATE 10 MG/ML IJ SOLN
INTRAMUSCULAR | Status: AC
  Filled 2015-03-24: qty 1

## 2015-03-24 MED ORDER — MEPERIDINE HCL 50 MG/ML IJ SOLN: 6.2500 mg | INTRAMUSCULAR | Status: DC | PRN

## 2015-03-24 SURGICAL SUPPLY — 64 items
APL SRG 32X5 SNPLK LF DISP (MISCELLANEOUS) ×2
APPLICATOR COTTON TIP 6IN STRL (MISCELLANEOUS) IMPLANT
APPLIER CLIP ROT 10 11.4 M/L (STAPLE)
APPLIER CLIP ROT 13.4 12 LRG (CLIP) ×4
APR CLP LRG 13.4X12 ROT 20 MLT (CLIP) ×2
APR CLP MED LRG 11.4X10 (STAPLE)
BLADE SURG 15 STRL LF DISP TIS (BLADE) ×2 IMPLANT
BLADE SURG 15 STRL SS (BLADE) ×4
CABLE HIGH FREQUENCY MONO STRZ (ELECTRODE) ×2 IMPLANT
CHLORAPREP W/TINT 26ML (MISCELLANEOUS) ×6 IMPLANT
CLIP APPLIE ROT 10 11.4 M/L (STAPLE) IMPLANT
CLIP APPLIE ROT 13.4 12 LRG (CLIP) IMPLANT
COVER SURGICAL LIGHT HANDLE (MISCELLANEOUS) ×2 IMPLANT
DEVICE SUTURE ENDOST 10MM (ENDOMECHANICALS) IMPLANT
DEVICE TROCAR PUNCTURE CLOSURE (ENDOMECHANICALS) IMPLANT
DISSECTOR BLUNT TIP ENDO 5MM (MISCELLANEOUS) ×4 IMPLANT
DRAPE CAMERA CLOSED 9X96 (DRAPES) ×4 IMPLANT
DRAPE UTILITY XL STRL (DRAPES) ×8 IMPLANT
ELECT REM PT RETURN 9FT ADLT (ELECTROSURGICAL) ×4
ELECTRODE REM PT RTRN 9FT ADLT (ELECTROSURGICAL) ×2 IMPLANT
GAUZE SPONGE 4X4 12PLY STRL (GAUZE/BANDAGES/DRESSINGS) IMPLANT
GLOVE SURG SIGNA 7.5 PF LTX (GLOVE) ×4 IMPLANT
GOWN STRL REUS W/TWL XL LVL3 (GOWN DISPOSABLE) ×16 IMPLANT
HOVERMATT SINGLE USE (MISCELLANEOUS) ×4 IMPLANT
KIT BASIN OR (CUSTOM PROCEDURE TRAY) ×4 IMPLANT
LIQUID BAND (GAUZE/BANDAGES/DRESSINGS) ×4 IMPLANT
NDL SPNL 22GX3.5 QUINCKE BK (NEEDLE) ×2 IMPLANT
NEEDLE SPNL 22GX3.5 QUINCKE BK (NEEDLE) ×4 IMPLANT
PACK UNIVERSAL I (CUSTOM PROCEDURE TRAY) ×4 IMPLANT
PEN SKIN MARKING BROAD (MISCELLANEOUS) ×4 IMPLANT
RELOAD STAPLE 60 3.6 BLU REG (STAPLE) IMPLANT
RELOAD STAPLE 60 3.8 GOLD REG (STAPLE) IMPLANT
RELOAD STAPLER BLUE 60MM (STAPLE) ×6 IMPLANT
RELOAD STAPLER GOLD 60MM (STAPLE) ×2 IMPLANT
RELOAD STAPLER GREEN 60MM (STAPLE) ×4 IMPLANT
SCISSORS LAP 5X35 DISP (ENDOMECHANICALS) ×2 IMPLANT
SEALANT SURGICAL APPL DUAL CAN (MISCELLANEOUS) ×4 IMPLANT
SET IRRIG TUBING LAPAROSCOPIC (IRRIGATION / IRRIGATOR) ×4 IMPLANT
SHEARS CURVED HARMONIC AC 45CM (MISCELLANEOUS) ×4 IMPLANT
SLEEVE ADV FIXATION 5X100MM (TROCAR) ×4 IMPLANT
SLEEVE GASTRECTOMY 36FR VISIGI (MISCELLANEOUS) ×4 IMPLANT
SOLUTION ANTI FOG 6CC (MISCELLANEOUS) ×4 IMPLANT
SPONGE LAP 18X18 X RAY DECT (DISPOSABLE) ×4 IMPLANT
STAPLER ECHELON LONG 60 440 (INSTRUMENTS) ×2 IMPLANT
STAPLER RELOAD BLUE 60MM (STAPLE) ×12
STAPLER RELOAD GOLD 60MM (STAPLE) ×4
STAPLER RELOAD GREEN 60MM (STAPLE) ×8
SUT ETHILON 2 0 PS N (SUTURE) IMPLANT
SUT MNCRL AB 4-0 PS2 18 (SUTURE) ×4 IMPLANT
SYR 10ML ECCENTRIC (SYRINGE) ×4 IMPLANT
SYR 20CC LL (SYRINGE) ×4 IMPLANT
TOWEL OR 17X26 10 PK STRL BLUE (TOWEL DISPOSABLE) ×4 IMPLANT
TOWEL OR NON WOVEN STRL DISP B (DISPOSABLE) ×4 IMPLANT
TRAY FOLEY W/METER SILVER 14FR (SET/KITS/TRAYS/PACK) ×2 IMPLANT
TRAY FOLEY W/METER SILVER 16FR (SET/KITS/TRAYS/PACK) ×1 IMPLANT
TROCAR ADV FIXATION 12X100MM (TROCAR) ×4 IMPLANT
TROCAR ADV FIXATION 5X100MM (TROCAR) ×4 IMPLANT
TROCAR BLADELESS 15MM (ENDOMECHANICALS) ×4 IMPLANT
TROCAR BLADELESS OPT 5 100 (ENDOMECHANICALS) ×4 IMPLANT
TROCAR XCEL 12X100 BLDLESS (ENDOMECHANICALS) IMPLANT
TUBING CONNECTING 10 (TUBING) ×3 IMPLANT
TUBING CONNECTING 10' (TUBING) ×1
TUBING ENDO SMARTCAP PENTAX (MISCELLANEOUS) ×4 IMPLANT
TUBING FILTER THERMOFLATOR (ELECTROSURGICAL) ×4 IMPLANT

## 2015-03-24 NOTE — Op Note (Signed)
PATIENT:   Carla Rogers DOB:   27-Dec-1975 MRN:   [id number]  DATE OF PROCEDURE: 03/24/2015                   FACILITY:  [facility]  OPERATIVE REPORT  PREOPERATIVE DIAGNOSIS:  Morbid obesity.  POSTOPERATIVE DIAGNOSIS:  Morbid obesity (weight 250, BMI of 47.4).  PROCEDURE:  Laparoscopic Sleeve gastrectomy (intraoperative upper endoscopy by Dr. Andrey Campanile)  [photos in chart]  SURGEON:  Sandria Bales. Ezzard Standing, MD  FIRST ASSISTANTFredonia Highland, MD  ANESTHESIA:  General endotracheal.  Anesthesiologist: Phillips Grout, MD; Cecile Hearing, MD CRNA: Illene Silver, CRNA; Theodosia Quay, CRNA  General  ESTIMATED BLOOD LOSS:  Minimal.  LOCAL ANESTHESIA:  30 cc of 1/4% Marcaine  COMPLICATIONS:  None.  INDICATION FOR SURGERY:  Carla Rogers is a 39 y.o. white  female who sees Josue Hector, MD as her primary care doctor.  She has completed our preoperative bariatric program and now comes for a laparoscopic Sleeve gastrectomy.  The indications, potential complications of surgery were explained to the patient.  Potential complications of the surgery include, but are not limited to, bleeding, infection, DVT, open surgery, and long-term nutritional consequences.  OPERATIVE NOTE:  The patient taken to room #1 at [facility] where Carla Rogers underwent a general endotracheal anesthetic, supervised by Anesthesiologist: Phillips Grout, MD; Cecile Hearing, MD CRNA: Illene Silver, CRNA; Theodosia Quay, CRNA.  The patient was given 2 g of cefoxitin at the beginning of the procedure.  A time-out was held and surgical checklist run.  I accessed her abdominal cavity through the left upper quadrant with a 12 mm Optiview. I did an abdominal exploration.   Her omentum and bowel were unremarkable. The right and left lobes of the liver unremarkable. Gallbladder was normal. Her stomach was unremarkable.   I placed a total of 6 trocars. I placed a 5 mm left lateral trocar, a 5 mm left paramedian trocar (for the scope),  a 12 mm right paramedian torcar, a 5 mm right subcostal trocar that I converted to a 15 mm to extract the stomach and 5 mm subxiphoid trocar for the liver retractor.  I started out taking down the greater curvature attachments of the stomach. I measured approximately 6 cm proximal from the pylorus and mobilized the greater curvature of the stomach with the Harmonic Scalpel. I took this dissection cranially around the greater curvature of her stomach to the angle of His and the left crus.   After I had mobilized the greater curvature of the stomach, I then passed the 36 French ViSiGi bougie which was used to suck the stomach up against the lesser curvature and placed into the antrum. During the staple firing,  I tried to give the ViSiGi a cuff at least about 1 cm. I tried to avoid narrowing the incisura. I used a total of 7 staple firings.  From antrum to the angle of His I used 2 green, 1 gold and 3 blue Eschelon 60 mm Ethicon staplers. I did not use staple line reinforcement.   At each firing of the EndoGIA stapler, I inspected the stomach, anterior wall of the stomach, and underneath to make sure there was no compromise or impingement on to the ViSiGi bougie.   The staple line seemed linear without any corkscrewing of the stomach. Hemostasis was good. I did not use any reinforcement. She did have at least 5 areas of bleeding which I used clips to clip on the  new greater curvature of the stomach.  The staple line was a oozier than normal.  I extracted the stomach before placing the Tissell  Because I thought we had a good staple line, I then had the ViSiGi was converted to insufflate the pouch. A new stomach pouch was placed under water. There was no bubbling or leak noted.   At this point, Dr. Andrey Campanile broke scrub and passed an upper endoscope down through the esophagus into the stomach pouch. The stomach was tubular. There was no narrowing of the stomach pouch or angulation. We were easily able to pass  the endoscope into the antrum and again put air pressure on the staple line. I irrigated the upper abdomen with saline. There was no bubbling or evidence of air leak. The mucosa looked viable. Dr. Andrey Campanile decompressed the stomach with the endoscopy.   I converted to right subcostal trocar to a 15 trocar and extracted the stomach remnant through this intact and sent this to Pathology. I then placed 20 cc of Tisseel along the new greater curvature staple line and covered the entire staple line with the Tisseel.  I aspirated out the saline that I had irrigated because I thought the staple line looked viable and complete. There was no evidence of leak. I did not leave a drain in place.   Then, I closed the trocar sites. The 15 mm trocar site did not need closure. The other port sites seemed smaller not requiring sutures. I closed the skin at each site with a 4-0 Monocryl, painted each wound with LiquiBand.   The patient was transported to recovery room in good condition. Sponge and needle count were correct at the end of the case.    Ovidio Kin, MD, [facility] Pager: [phone] Office phone:  [phone]

## 2015-03-24 NOTE — Transfer of Care (Signed)
Immediate Anesthesia Transfer of Care Note  Patient: Carla Rogers  Procedure(s) Performed: Procedure(s): LAPAROSCOPIC GASTRIC SLEEVE RESECTION UPPER ENDOSCOPY (N/A) UPPER GI ENDOSCOPY  Patient Location: PACU  Anesthesia Type:General  Level of Consciousness: awake, alert , oriented and patient cooperative  Airway & Oxygen Therapy: Patient Spontanous Breathing and Patient connected to face mask oxygen  Post-op Assessment: Report given to RN, Post -op Vital signs reviewed and stable and Patient moving all extremities X 4  Post vital signs: stable  Last Vitals:  Filed Vitals:   03/24/15 1611  BP: 166/91  Pulse: 102  Temp:   Resp: 22    Complications: No apparent anesthesia complications

## 2015-03-24 NOTE — Anesthesia Preprocedure Evaluation (Signed)
Anesthesia Evaluation  Patient identified by MRN, date of birth, ID band Patient awake    Reviewed: Allergy & Precautions, NPO status , Patient's Chart, lab work & pertinent test results  History of Anesthesia Complications (+) PONV and PROLONGED EMERGENCE  Airway Mallampati: II  TM Distance: >3 FB Neck ROM: Full    Dental no notable dental hx.    Pulmonary sleep apnea and Continuous Positive Airway Pressure Ventilation , former smoker,  breath sounds clear to auscultation  Pulmonary exam normal       Cardiovascular hypertension, Pt. on medications Normal cardiovascular examRhythm:Regular Rate:Normal     Neuro/Psych negative neurological ROS  negative psych ROS   GI/Hepatic negative GI ROS, Neg liver ROS,   Endo/Other  diabetes, Type 2, Oral Hypoglycemic AgentsMorbid obesity  Renal/GU negative Renal ROS  negative genitourinary   Musculoskeletal negative musculoskeletal ROS (+)   Abdominal   Peds negative pediatric ROS (+)  Hematology negative hematology ROS (+)   Anesthesia Other Findings   Reproductive/Obstetrics negative OB ROS                             Anesthesia Physical Anesthesia Plan  ASA: III  Anesthesia Plan: General   Post-op Pain Management:    Induction: Intravenous  Airway Management Planned: Oral ETT  Additional Equipment:   Intra-op Plan:   Post-operative Plan: Extubation in OR  Informed Consent: I have reviewed the patients History and Physical, chart, labs and discussed the procedure including the risks, benefits and alternatives for the proposed anesthesia with the patient or authorized representative who has indicated his/her understanding and acceptance.   Dental advisory given  Plan Discussed with: CRNA  Anesthesia Plan Comments:         Anesthesia Quick Evaluation

## 2015-03-24 NOTE — Interval H&P Note (Signed)
History and Physical Interval Note:  03/24/2015 1:19 PM  Carla Rogers  has presented today for surgery, with the diagnosis of Morbid Obesity  The various methods of treatment have been discussed with the patient and family.  Her husband, Thayer Ohm, and her brother, Thurmond Butts, is here with her.  After consideration of risks, benefits and other options for treatment, the patient has consented to  Procedure(s): LAPAROSCOPIC GASTRIC SLEEVE RESECTION (N/A) as a surgical intervention .  The patient's history has been reviewed, patient examined, no change in status, stable for surgery.  I have reviewed the patient's chart and labs.  Questions were answered to the patient's satisfaction.     Kelsay Haggard H

## 2015-03-24 NOTE — Anesthesia Postprocedure Evaluation (Signed)
  Anesthesia Post-op Note  Patient: Carla Rogers  Procedure(s) Performed: Procedure(s) (LRB): LAPAROSCOPIC GASTRIC SLEEVE RESECTION UPPER ENDOSCOPY (N/A) UPPER GI ENDOSCOPY  Patient Location: PACU  Anesthesia Type: General  Level of Consciousness: awake and alert   Airway and Oxygen Therapy: Patient Spontanous Breathing  Post-op Pain: mild  Post-op Assessment: Post-op Vital signs reviewed, Patient's Cardiovascular Status Stable, Respiratory Function Stable, Patent Airway and No signs of Nausea or vomiting  Last Vitals:  Filed Vitals:   03/24/15 2000  BP: 137/75  Pulse: 85  Temp: 37.3 C  Resp: 20    Post-op Vital Signs: stable   Complications: No apparent anesthesia complications

## 2015-03-24 NOTE — Op Note (Signed)
Carla Rogers [id number] Nov 10, 1975 03/24/2015  Preoperative diagnosis: morbid obesity  Postoperative diagnosis: Same   Procedure: upper endoscopy  Surgeon: Mary Sella. Inocencio Roy M.D., FACS   Anesthesia: Gen.   Indications for procedure: 39 year old female undergoing Laparoscopic Gastric Sleeve Resection and an EGD was requested to evaluate the new gastric sleeve.   Description of procedure: After we have completed the sleeve resection, I scrubbed out and obtained the Olympus endoscope. I gently placed endoscope in the patient's oropharynx and gently glided it down the esophagus without any difficulty under direct visualization. Once I was in the gastric sleeve, I insufflated the stomach with air. I was able to cannulate and advanced the scope through the gastric sleeve. I was able to cannulate the duodenum with ease. Dr. Ezzard Standing had placed saline in the upper abdomen. Upon further insufflation of the gastric sleeve there was no evidence of bubbles. GE junction located at 42 cm.  Upon further inspection of the gastric sleeve, the mucosa appeared normal. There is no evidence of any mucosal abnormality. The sleeve was widely patent at the angularis. There was no evidence of bleeding. The gastric sleeve was decompressed. The scope was withdrawn. The patient tolerated this portion of the procedure well. Please see Dr Allene Pyo operative note for details regarding the laparoscopic gastric sleeve resection.   Mary Sella. Andrey Campanile, MD, FACS  General, Bariatric, & Minimally Invasive Surgery  [facility], [place]

## 2015-03-24 NOTE — Anesthesia Procedure Notes (Signed)
Procedure Name: Intubation Date/Time: 03/24/2015 1:57 PM Performed by: Seira Cody, Nuala Alpha Pre-anesthesia Checklist: Patient identified, Emergency Drugs available, Suction available, Patient being monitored and Timeout performed Patient Re-evaluated:Patient Re-evaluated prior to inductionOxygen Delivery Method: Circle system utilized Preoxygenation: Pre-oxygenation with 100% oxygen Intubation Type: IV induction Ventilation: Mask ventilation without difficulty Laryngoscope Size: Mac and 4 Grade View: Grade II Tube type: Oral Tube size: 7.5 mm Number of attempts: 1 Airway Equipment and Method: Stylet Placement Confirmation: ETT inserted through vocal cords under direct vision,  positive ETCO2,  CO2 detector and breath sounds checked- equal and bilateral Secured at: 21 cm Tube secured with: Tape Dental Injury: Teeth and Oropharynx as per pre-operative assessment

## 2015-03-25 ENCOUNTER — Encounter (HOSPITAL_COMMUNITY): Payer: Self-pay | Admitting: Surgery

## 2015-03-25 ENCOUNTER — Inpatient Hospital Stay (HOSPITAL_COMMUNITY): Payer: BLUE CROSS/BLUE SHIELD

## 2015-03-25 LAB — CBC WITH DIFFERENTIAL/PLATELET
Basophils Absolute: 0 10*3/uL (ref 0.0–0.1)
Basophils Relative: 0 % (ref 0–1)
EOS ABS: 0 10*3/uL (ref 0.0–0.7)
Eosinophils Relative: 0 % (ref 0–5)
HEMATOCRIT: 38.4 % (ref 36.0–46.0)
HEMOGLOBIN: 12.3 g/dL (ref 12.0–15.0)
LYMPHS ABS: 1.6 10*3/uL (ref 0.7–4.0)
LYMPHS PCT: 14 % (ref 12–46)
MCH: 28 pg (ref 26.0–34.0)
MCHC: 32 g/dL (ref 30.0–36.0)
MCV: 87.5 fL (ref 78.0–100.0)
Monocytes Absolute: 0.8 10*3/uL (ref 0.1–1.0)
Monocytes Relative: 7 % (ref 3–12)
NEUTROS ABS: 8.9 10*3/uL — AB (ref 1.7–7.7)
NEUTROS PCT: 79 % — AB (ref 43–77)
Platelets: 205 10*3/uL (ref 150–400)
RBC: 4.39 MIL/uL (ref 3.87–5.11)
RDW: 14.9 % (ref 11.5–15.5)
WBC: 11.3 10*3/uL — AB (ref 4.0–10.5)

## 2015-03-25 LAB — GLUCOSE, CAPILLARY
GLUCOSE-CAPILLARY: 127 mg/dL — AB (ref 65–99)
Glucose-Capillary: 102 mg/dL — ABNORMAL HIGH (ref 65–99)
Glucose-Capillary: 109 mg/dL — ABNORMAL HIGH (ref 65–99)
Glucose-Capillary: 89 mg/dL (ref 65–99)
Glucose-Capillary: 97 mg/dL (ref 65–99)

## 2015-03-25 LAB — HEMOGLOBIN AND HEMATOCRIT, BLOOD
HEMATOCRIT: 35.9 % — AB (ref 36.0–46.0)
HEMOGLOBIN: 11.3 g/dL — AB (ref 12.0–15.0)

## 2015-03-25 MED ORDER — IOHEXOL 300 MG/ML  SOLN: 50.0000 mL | Freq: Once | INTRAMUSCULAR | Status: DC | PRN

## 2015-03-25 NOTE — Progress Notes (Signed)
Patient alert and oriented, Post op day 1.  Provided support and encouragement.  Encouraged pulmonary toilet, ambulation and small sips of liquids when swallow returned satisfactorily.  All questions answered.  Will continue to monitor.

## 2015-03-25 NOTE — Care Management Note (Signed)
Case Management Note  Patient Details  Name: Carla Rogers MRN: [id number] Date of Birth: 10-Jul-1976  Subjective/Objective:            Admitted s/p gastric sleeve       Action/Plan: Discharge planning  Expected Discharge Date:                  Expected Discharge Plan:  Home/Self Care  In-House Referral:  NA  Discharge planning Services  CM Consult  Post Acute Care Choice:  NA Choice offered to:  NA  DME Arranged:  N/A DME Agency:  NA  HH Arranged:  NA HH Agency:  NA  Status of Service:  Completed, signed off  Medicare Important Message Given:    Date Medicare IM Given:    Medicare IM give by:    Date Additional Medicare IM Given:    Additional Medicare Important Message give by:     If discussed at Long Length of Stay Meetings, dates discussed:    Additional Comments:  Alexis Goodell, RN 03/25/2015, 10:40 AM

## 2015-03-25 NOTE — Plan of Care (Signed)
Problem: Food- and Nutrition-Related Knowledge Deficit (NB-1.1) Goal: Nutrition education Formal process to instruct or train a patient/client in a skill or to impart knowledge to help patients/clients voluntarily manage or modify food choices and eating behavior to maintain or improve health. Outcome: Completed/Met Date Met:  03/25/15 Nutrition Education Note  Received consult for diet education per DROP protocol.   Discussed 2 week post op diet with pt. Emphasized that liquids must be non carbonated, non caffeinated, and sugar free. Fluid goals discussed. Pt to follow up with outpatient bariatric RD for further diet progression after 2 weeks. Multivitamins and minerals also reviewed. Teach back method used, pt expressed understanding, expect good compliance.   Diet: First 2 Weeks  You will see the nutritionist about two (2) weeks after your surgery. The nutritionist will increase the types of foods you can eat if you are handling liquids well:  If you have severe vomiting or nausea and cannot handle clear liquids lasting longer than 1 day, call your surgeon  Protein Shake  Drink at least 2 ounces of shake 5-6 times per day  Each serving of protein shakes (usually 8 - 12 ounces) should have a minimum of:  15 grams of protein  And no more than 5 grams of carbohydrate  Goal for protein each day:  Men = 80 grams per day  Women = 60 grams per day  Protein powder may be added to fluids such as non-fat milk or Lactaid milk or Soy milk (limit to 35 grams added protein powder per serving)   Hydration  Slowly increase the amount of water and other clear liquids as tolerated (See Acceptable Fluids)  Slowly increase the amount of protein shake as tolerated  Sip fluids slowly and throughout the day  May use sugar substitutes in small amounts (no more than 6 - 8 packets per day; i.e. Splenda)   Fluid Goal  The first goal is to drink at least 8 ounces of protein shake/drink per day (or as directed  by the nutritionist); some examples of protein shakes are Syntrax Nectar, Adkins Advantage, EAS Edge HP, and Unjury. See handout from pre-op Bariatric Education Class:  Slowly increase the amount of protein shake you drink as tolerated  You may find it easier to slowly sip shakes throughout the day  It is important to get your proteins in first  Your fluid goal is to drink 64 - 100 ounces of fluid daily  It may take a few weeks to build up to this  32 oz (or more) should be clear liquids  And  32 oz (or more) should be full liquids (see below for examples)  Liquids should not contain sugar, caffeine, or carbonation   Clear Liquids:  Water or Sugar-free flavored water (i.e. Fruit H2O, Propel)  Decaffeinated coffee or tea (sugar-free)  Crystal Lite, Wyler's Lite, Minute Maid Lite  Sugar-free Jell-O  Bouillon or broth  Sugar-free Popsicle: *Less than 20 calories each; Limit 1 per day   Full Liquids:  Protein Shakes/Drinks + 2 choices per day of other full liquids  Full liquids must be:  No More Than 12 grams of Carbs per serving  No More Than 3 grams of Fat per serving  Strained low-fat cream soup  Non-Fat milk  Fat-free Lactaid Milk  Sugar-free yogurt (Dannon Lite & Fit, Greek yogurt)     Brayam Boeke, MS, RD, LDN Pager: [phone] After Hours Pager: [phone]        

## 2015-03-25 NOTE — Progress Notes (Signed)
General Surgery Note  LOS: 1 day  POD -  1 Day Post-Op  Assessment/Plan: 1.  LAPAROSCOPIC GASTRIC SLEEVE RESECTION,  UPPER ENDOSCOPY - 03/24/2015 - D. Elijha Dedman  For UGI today  2. OSA On CPAP - set up in [place]. But the physician who set her up has left, so she is not sure whom will follow her. 3. DM x 12 years  Glucose - 127 - 03/25/2015 On oral hypoglycemics Hgb A1C - 6.1 - 09/30/2014 4. HTN 5. GERD Omeprazole 6.  DVT prophylaxis - SQ Heparin   Active Problems:   Morbid obesity  Subjective:  Sore in LUQ.  No nausea.  Walked halls several times last PM.  In the room by herself. Objective:   Filed Vitals:   03/25/15 0540  BP: 129/68  Pulse: 79  Temp: 98.8 F (37.1 C)  Resp: 20     Intake/Output from previous day:  08/23 0701 - 08/24 0700 In: 4422.5 [I.V.:4422.5] Out: 3200 [Urine:3100; Blood:100]  Intake/Output this shift:      Physical Exam:   General: Obese WF who is alert and oriented.    HEENT: Normal. Pupils equal. .   Lungs: Clear.   IS - 1,000 cc   Abdomen: Soft   Wound: Okay   Lab Results:    Recent Labs  03/24/15 2021 03/25/15 0526  WBC  --  11.3*  HGB 12.5 12.3  HCT 38.4 38.4  PLT  --  205    BMET  No results for input(s): NA, K, CL, CO2, GLUCOSE, BUN, CREATININE, CALCIUM in the last 72 hours.  PT/INR  No results for input(s): LABPROT, INR in the last 72 hours.  ABG  No results for input(s): PHART, HCO3 in the last 72 hours.  Invalid input(s): PCO2, PO2   Studies/Results:  No results found.   Anti-infectives:   Anti-infectives    Start     Dose/Rate Route Frequency Ordered Stop   03/24/15 1127  cefoTEtan in Dextrose 5% (CEFOTAN) IVPB 2 g     2 g Intravenous On call to O.R. 03/24/15 1127 03/24/15 1400      Ovidio Kin, MD, FACS Pager: [phone] [facility] Office: [phone] 03/25/2015

## 2015-03-26 LAB — CBC WITH DIFFERENTIAL/PLATELET
Basophils Absolute: 0 10*3/uL (ref 0.0–0.1)
Basophils Relative: 0 % (ref 0–1)
EOS ABS: 0.1 10*3/uL (ref 0.0–0.7)
Eosinophils Relative: 1 % (ref 0–5)
HCT: 35.6 % — ABNORMAL LOW (ref 36.0–46.0)
HEMOGLOBIN: 11.4 g/dL — AB (ref 12.0–15.0)
LYMPHS ABS: 2.6 10*3/uL (ref 0.7–4.0)
Lymphocytes Relative: 30 % (ref 12–46)
MCH: 28.4 pg (ref 26.0–34.0)
MCHC: 32 g/dL (ref 30.0–36.0)
MCV: 88.6 fL (ref 78.0–100.0)
MONO ABS: 0.7 10*3/uL (ref 0.1–1.0)
MONOS PCT: 8 % (ref 3–12)
NEUTROS PCT: 61 % (ref 43–77)
Neutro Abs: 5.4 10*3/uL (ref 1.7–7.7)
Platelets: 186 10*3/uL (ref 150–400)
RBC: 4.02 MIL/uL (ref 3.87–5.11)
RDW: 15.1 % (ref 11.5–15.5)
WBC: 8.8 10*3/uL (ref 4.0–10.5)

## 2015-03-26 LAB — GLUCOSE, CAPILLARY
GLUCOSE-CAPILLARY: 104 mg/dL — AB (ref 65–99)
GLUCOSE-CAPILLARY: 95 mg/dL (ref 65–99)
Glucose-Capillary: 103 mg/dL — ABNORMAL HIGH (ref 65–99)
Glucose-Capillary: 104 mg/dL — ABNORMAL HIGH (ref 65–99)

## 2015-03-26 NOTE — Progress Notes (Signed)
Discharge instructions discussed. Patient able to answer questions about when to call MD and how often to walk and do IS while awake. Aware of Follow up appointment and has script for pain med. Am assessment unchanged except IV discontinued.

## 2015-03-26 NOTE — Discharge Summary (Signed)
Physician Discharge Summary  Patient ID:  Carla Rogers  MRN: [id number]  DOB/AGE: [birth year] 39 y.o.  Admit date: 03/24/2015 Discharge date: 03/26/2015  Discharge Diagnoses:  1.  Morbid obesity  weight 250, BMI of 47.4 2. OSA On CPAP - set up in [place]. But the physician who set her up has left, so she is not sure whom will follow her. 3. DM x 12 years Glucose - 127 - 03/25/2015 On oral hypoglycemics Hgb A1C - 6.1 - 09/30/2014 4. HTN 5. GERD Omeprazole   Active Problems:   Morbid obesity  Operation: Procedure(s): LAPAROSCOPIC GASTRIC SLEEVE RESECTION, UPPER ENDOSCOPY  on 03/24/2015 - D. Ezzard Standing  Discharged Condition: good  Hospital Course: Ollie KEARSTIN LEARN is an 39 y.o. female whose primary care physician is Josue Hector, MD and who was admitted 03/24/2015 with a chief complaint of morbid obesity. She completed our bariatric pre op evaluation and now comes for bariatric surgery.  She was brought to the operating room on 03/24/2015 and underwent  LAPAROSCOPIC GASTRIC SLEEVE RESECTION, UPPER ENDOSCOPY.  Her swallow the first post op day looked good.  She has tolerated water. She will advance to protein drinks and go home later today.  The discharge instructions were reviewed with the patient.  Consults: None  Significant Diagnostic Studies: Results for orders placed or performed during the hospital encounter of 03/24/15  Pregnancy, urine STAT morning of surgery  Result Value Ref Range   Preg Test, Ur NEGATIVE NEGATIVE  Glucose, capillary  Result Value Ref Range   Glucose-Capillary 103 (H) 65 - 99 mg/dL   Comment 1 Notify RN   Glucose, capillary  Result Value Ref Range   Glucose-Capillary 127 (H) 65 - 99 mg/dL   Comment 1 Notify RN    Comment 2 Document in Chart   Hemoglobin and hematocrit, blood  Result Value Ref Range   Hemoglobin 12.5 12.0 - 15.0 g/dL   HCT 40.9 81.1 - 91.4 %  CBC WITH DIFFERENTIAL   Result Value Ref Range   WBC 11.3 (H) 4.0 - 10.5 K/uL   RBC 4.39 3.87 - 5.11 MIL/uL   Hemoglobin 12.3 12.0 - 15.0 g/dL   HCT 78.2 95.6 - 21.3 %   MCV 87.5 78.0 - 100.0 fL   MCH 28.0 26.0 - 34.0 pg   MCHC 32.0 30.0 - 36.0 g/dL   RDW 08.6 57.8 - 46.9 %   Platelets 205 150 - 400 K/uL   Neutrophils Relative % 79 (H) 43 - 77 %   Neutro Abs 8.9 (H) 1.7 - 7.7 K/uL   Lymphocytes Relative 14 12 - 46 %   Lymphs Abs 1.6 0.7 - 4.0 K/uL   Monocytes Relative 7 3 - 12 %   Monocytes Absolute 0.8 0.1 - 1.0 K/uL   Eosinophils Relative 0 0 - 5 %   Eosinophils Absolute 0.0 0.0 - 0.7 K/uL   Basophils Relative 0 0 - 1 %   Basophils Absolute 0.0 0.0 - 0.1 K/uL  Glucose, capillary  Result Value Ref Range   Glucose-Capillary 145 (H) 65 - 99 mg/dL  Glucose, capillary  Result Value Ref Range   Glucose-Capillary 144 (H) 65 - 99 mg/dL  Hemoglobin and hematocrit, blood  Result Value Ref Range   Hemoglobin 11.3 (L) 12.0 - 15.0 g/dL   HCT 62.9 (L) 52.8 - 41.3 %  Glucose, capillary  Result Value Ref Range   Glucose-Capillary 127 (H) 65 - 99 mg/dL  Glucose, capillary  Result Value Ref Range  Glucose-Capillary 102 (H) 65 - 99 mg/dL  Glucose, capillary  Result Value Ref Range   Glucose-Capillary 109 (H) 65 - 99 mg/dL  Glucose, capillary  Result Value Ref Range   Glucose-Capillary 89 65 - 99 mg/dL  CBC with Differential  Result Value Ref Range   WBC 8.8 4.0 - 10.5 K/uL   RBC 4.02 3.87 - 5.11 MIL/uL   Hemoglobin 11.4 (L) 12.0 - 15.0 g/dL   HCT 16.1 (L) 09.6 - 04.5 %   MCV 88.6 78.0 - 100.0 fL   MCH 28.4 26.0 - 34.0 pg   MCHC 32.0 30.0 - 36.0 g/dL   RDW 40.9 81.1 - 91.4 %   Platelets 186 150 - 400 K/uL   Neutrophils Relative % 61 43 - 77 %   Neutro Abs 5.4 1.7 - 7.7 K/uL   Lymphocytes Relative 30 12 - 46 %   Lymphs Abs 2.6 0.7 - 4.0 K/uL   Monocytes Relative 8 3 - 12 %   Monocytes Absolute 0.7 0.1 - 1.0 K/uL   Eosinophils Relative 1 0 - 5 %   Eosinophils Absolute 0.1 0.0 - 0.7 K/uL    Basophils Relative 0 0 - 1 %   Basophils Absolute 0.0 0.0 - 0.1 K/uL  Glucose, capillary  Result Value Ref Range   Glucose-Capillary 97 65 - 99 mg/dL  Glucose, capillary  Result Value Ref Range   Glucose-Capillary 104 (H) 65 - 99 mg/dL  Glucose, capillary  Result Value Ref Range   Glucose-Capillary 103 (H) 65 - 99 mg/dL    Dg Ugi W/water Sol Cm  03/25/2015   CLINICAL DATA:  Postop gastric sleeve yesterday.  EXAM: WATER SOLUBLE UPPER GI SERIES  TECHNIQUE: Single-column upper GI series was performed using water soluble contrast.  CONTRAST:  50 cc Omnipaque.  COMPARISON:  02/23/2015.  FLUOROSCOPY TIME:  Radiation Exposure Index (as provided by the fluoroscopic device):  If the device does not provide the exposure index:  Fluoroscopy Time (in minutes and seconds):  0 minutes 23 seconds.  Number of Acquired Images:  3  FINDINGS: Scout view of the abdomen shows postoperative changes in the left upper quadrant of gastric sleeve procedure. Bowel gas pattern is unremarkable.  Patient drank 50 cc Omnipaque. Contrast flowed readily through the residual stomach into small bowel. No leak.  IMPRESSION: Normal postoperative day 1 appearance of a gastric sleeve procedure.   Electronically Signed   By: Leanna Battles M.D.   On: 03/25/2015 11:01    Discharge Exam:  Filed Vitals:   03/26/15 0547  BP: 121/56  Pulse: 79  Temp: 97.9 F (36.6 C)  Resp: 18    General: Obese WF who is alert and generally healthy appearing.  Lungs: Clear to auscultation and symmetric breath sounds. Heart:  RRR. No murmur or rub. Abdomen: Soft. No mass. Normal bowel sounds. Incisions look good  Discharge Medications:     Medication List    ASK your doctor about these medications        buPROPion 150 MG 12 hr tablet  Commonly known as:  ZYBAN  Take 300 mg by mouth at bedtime.     calcium gluconate 500 MG tablet  Take 1 tablet by mouth 3 (three) times daily.     escitalopram 20 MG tablet  Commonly known as:   LEXAPRO  Take 20 mg by mouth every evening.     fluticasone 50 MCG/ACT nasal spray  Commonly known as:  FLONASE  Place 1 spray into both nostrils  daily as needed for allergies or rhinitis.     ibuprofen 200 MG tablet  Commonly known as:  ADVIL,MOTRIN  Take 400 mg by mouth every 6 (six) hours as needed for headache.     INVOKANA 100 MG Tabs tablet  Generic drug:  canagliflozin  Take 100 mg by mouth daily.     levonorgestrel 20 MCG/24HR IUD  Commonly known as:  MIRENA  1 each by Intrauterine route once.     lisinopril 10 MG tablet  Commonly known as:  PRINIVIL,ZESTRIL  Take 10 mg by mouth every morning.     LORazepam 1 MG tablet  Commonly known as:  ATIVAN  Take 1 mg by mouth daily as needed for anxiety or sleep.     metFORMIN 500 MG tablet  Commonly known as:  GLUCOPHAGE  Take 1,000 mg by mouth 2 (two) times daily with a meal.     montelukast 10 MG tablet  Commonly known as:  SINGULAIR  Take 10 mg by mouth every morning.     omeprazole 20 MG capsule  Commonly known as:  PRILOSEC  Take 20 mg by mouth every morning.     rosuvastatin 20 MG tablet  Commonly known as:  CRESTOR  Take 20 mg by mouth every evening.     solifenacin 5 MG tablet  Commonly known as:  VESICARE  Take 5 mg by mouth every morning.     THERA Tabs  Take 1 tablet by mouth every morning. Chewable     UNABLE TO FIND  C-PAP     vitamin B-12 100 MCG tablet  Commonly known as:  CYANOCOBALAMIN  Take 100 mcg by mouth every morning.        Disposition: Final discharge disposition not confirmed   Activity:  Driving - May drive in 4 or 5 days, if doing wwell   Lifting - Take it easy for one week, then no limits       The goal is walking (exercise) one hour a day 5 days a week.  Wound Care:   May shower  Diet:  Gastric sleeve diet  Follow up appointment:  Call Dr. Allene Pyo office [facility]) at [phone] for an appointment in 2 weeks.  Medications and dosages:  Resume your  home medications.          Check your blood sugars regularly.  You have a prescription for:  oxycodone  Signed: Ovidio Kin, M.D., [facility] Office:  [phone]  03/26/2015, 7:44 AM

## 2015-03-26 NOTE — Discharge Instructions (Signed)
[facility] - DISCHARGE INSTRUCTIONS TO PATIENT  Activity:  Driving - May drive in 4 or 5 days, if doing wwell   Lifting - Take it easy for one week, then no limits       The goal is walking (exercise) one hour a day 5 days a week.  Wound Care:   May shower  Diet:  Gastric sleeve diet  Follow up appointment:  Call Dr. Allene Pyo office [facility]) at [phone] for an appointment in 2 weeks.  Medications and dosages:  Resume your home medications.          Check your blood sugars regularly.  You have a prescription for:  oxycodone  Call Dr. Ezzard Standing or his office  [phone]) if you have:  Temperature greater than 100.4,  Persistent nausea and vomiting,  Severe uncontrolled pain,  Redness, tenderness, or signs of infection (pain, swelling, redness, odor or green/yellow discharge around the site),  Difficulty breathing, headache or visual disturbances,  Any other questions or concerns you may have after discharge.  In an emergency, call 911 or go to an Emergency Department at a nearby hospital.       GASTRIC BYPASS/SLEEVE  Home Care Instructions   These instructions are to help you care for yourself when you go home.  Call: If you have any problems.  Call [phone] and ask for the surgeon on call  If you need immediate assistance come to the ER at [facility]. Tell the ER staff you are a new post-op gastric bypass or gastric sleeve patient  Signs and symptoms to report:  Severe  vomiting or nausea o If you cannot handle clear liquids for longer than 1 day, call your surgeon  Abdominal pain which does not get better after taking your pain medication  Fever greater than 100.4  F and chills  Heart rate over 100 beats a minute  Trouble breathing  Chest pain  Redness,  swelling, drainage, or foul odor at incision (surgical) sites  If your incisions open or pull apart  Swelling or pain in calf (lower leg)  Diarrhea (Loose bowel  movements that happen often), frequent watery, uncontrolled bowel movements  Constipation, (no bowel movements for 3 days) if this happens: o Take Milk of Magnesia, 2 tablespoons by mouth, 3 times a day for 2 days if needed o Stop taking Milk of Magnesia once you have had a bowel movement o Call your doctor if constipation continues Or o Take Miralax  (instead of Milk of Magnesia) following the label instructions o Stop taking Miralax once you have had a bowel movement o Call your doctor if constipation continues  Anything you think is abnormal for you   Normal side effects after surgery:  Unable to sleep at night or unable to concentrate  Irritability  Being tearful (crying) or depressed  These are common complaints, possibly related to your anesthesia, stress of surgery, and change in lifestyle, that usually go away a few weeks after surgery. If these feelings continue, call your medical doctor.  Wound Care: You may have surgical glue, steri-strips, or staples over your incisions after surgery  Surgical glue: Looks like clear film over your incisions and will wear off a little at a time  Steri-strips: Adhesive strips of tape over your incisions. You may notice a yellowish color on skin under the steri-strips. This is used to make the steri-strips stick better. Do not pull the steri-strips off - let them fall off  Staples: Staples may  be removed before you leave the hospital o If you go home with staples, call [facility] for an appointment with your surgeons nurse to have staples removed 10 days after surgery, [phone]  Showering: You may shower two (2) days after your surgery unless your surgeon tells you differently o Wash gently around incisions with warm soapy water, rinse well, and gently pat dry o If you have a drain (tube from your incision), you may need someone to hold this while you shower o No tub baths until staples are removed and incisions are  healed   Medications:  Medications should be liquid or crushed if larger than the size of a dime  Extended release pills (medication that releases a little bit at a time through the  day) should not be crushed  Depending on the size and number of medications you take, you may need to space (take a few throughout the day)/change the time you take your medications so that you do not over-fill your pouch (smaller stomach)  Make sure you follow-up with you primary care physician to make medication changes needed during rapid weight loss and life -style changes  If you have diabetes, follow up with your doctor that orders your diabetes medication(s) within one week after surgery and check your blood sugar regularly   Do not drive while taking narcotics (pain medications)   Do not take acetaminophen (Tylenol) and Roxicet or Lortab Elixir at the same time since these pain medications contain acetaminophen   Diet:  First 2 Weeks You will see the nutritionist about two (2) weeks after your surgery. The nutritionist will increase the types of foods you can eat if you are handling liquids well:  If you have severe vomiting or nausea and cannot handle clear liquids lasting longer than 1 day call your surgeon Protein Shake  Drink at least 2 ounces of shake 5-6 times per day  Each serving of protein shakes (usually 8-12 ounces) should have a minimum of: o 15 grams of protein o And no more than 5 grams of carbohydrate  Goal for protein each day: o Men = 80 grams per day o Women = 60 grams per day     Protein powder may be added to fluids such as non-fat milk or Lactaid milk or Soy milk (limit to 35 grams added protein powder per serving)  Hydration  Slowly increase the amount of water and other clear liquids as tolerated (See Acceptable Fluids)  Slowly increase the amount of protein shake as tolerated  Sip fluids slowly and throughout the day  May use sugar substitutes in small amounts  (no more than 6-8 packets per day; i.e. Splenda)  Fluid Goal  The first goal is to drink at least 8 ounces of protein shake/drink per day (or as directed by the nutritionist); some examples of protein shakes are [employer], [employer], EAS Edge HP, and Unjury. - See handout from pre-op Bariatric Education Class: o Slowly increase the amount of protein shake you drink as tolerated o You may find it easier to slowly sip shakes throughout the day o It is important to get your proteins in first  Your fluid goal is to drink 64-100 ounces of fluid daily o It may take a few weeks to build up to this   32 oz. (or more) should be clear liquids And  32 oz. (or more) should be full liquids (see below for examples)  Liquids should not contain sugar, caffeine, or  carbonation  Clear Liquids:  Water of Sugar-free flavored water (i.e. Fruit HO, Propel)  Decaffeinated coffee or tea (sugar-free)  Crystal lite, Wylers Lite, Minute Maid Lite  Sugar-free Jell-O  Bouillon or broth  Sugar-free Popsicle:    - Less than 20 calories each; Limit 1 per day  Full Liquids:                   Protein Shakes/Drinks + 2 choices per day of other full liquids  Full liquids must be: o No More Than 12 grams of Carbs per serving o No More Than 3 grams of Fat per serving  Strained low-fat cream soup  Non-Fat milk  Fat-free Lactaid Milk  Sugar-free yogurt (Dannon Lite & Fit, Greek yogurt)    Vitamins and Minerals  Start 1 day after surgery unless otherwise directed by your surgeon  2 Chewable Multivitamin / Multimineral Supplement with iron (i.e. Centrum for Adults)  Vitamin B-12, 350-500 micrograms sub-lingual (place tablet under the tongue) each day  Chewable Calcium Citrate with Vitamin D-3 (Example: 3 Chewable Calcium  Plus 600 with Vitamin D-3) o Take 500 mg three (3) times a day for a total of 1500 mg each day o Do not take all 3 doses of calcium at one time as it may cause  constipation, and you can only absorb 500 mg at a time o Do not mix multivitamins containing iron with calcium supplements;  take 2 hours apart o Do not substitute Tums (calcium carbonate) for your calcium  Menstruating women and those at risk for anemia ( a blood disease that causes weakness) may need extra iron o Talk to your doctor to see if you need more iron  If you need extra iron: Total daily Iron recommendation (including Vitamins) is 50 to 100 mg Iron/day  Do not stop taking or change any vitamins or minerals until you talk to your nutritionist or surgeon  Your nutritionist and/or surgeon must approve all vitamin and mineral supplements   Activity and Exercise: It is important to continue walking at home. Limit your physical activity as instructed by your doctor. During this time, use these guidelines:  Do not lift anything greater than ten  (10) pounds for at least two (2) weeks  Do not go back to work or drive until Designer, industrial/product says you can  You may have sex when you feel comfortable o It is VERY important for female patients to use a reliable birth control method; fertility often increase after surgery o Do not get pregnant for at least 18 months  Start exercising as soon as your doctor tells you that you can o Make sure your doctor approves any physical activity  Start with a simple walking program  Walk 5-15 minutes each day, 7 days per week  Slowly increase until you are walking 30-45 minutes per day  Consider joining our BELT program. [phone] or email [email or web address]   Special Instructions Things to remember:  Free counseling is available for you and your family through collaboration between [facility] and [place]. Please call [phone] and leave a message  Use your CPAP when sleeping if this applies to you  Consider buying a medical alert bracelet that says you had lap-band surgery     You will likely have your first fill (fluid added to your band) 6  - 8 weeks after surgery  [facility] has a free Bariatric Surgery Support Group that meets monthly, the 3rd Thursday, 6pm. [facility] Classrooms. You can see classes online at [email or web address]  It is very important to keep all follow up appointments with your surgeon, nutritionist, primary care physician, and behavioral health practitioner o After the first year, please follow up with your bariatric surgeon and nutritionist at least once a year in order to maintain best weight loss results                    [facility]:  [phone]               [facility]: [phone]               Bariatric Nurse Coordinator: [phone]  Gastric Bypass/Sleeve Home Care Instructions  Rev. 09/2012                                                         Reviewed and Endorsed                                                    by [facility] Patient Education Committee, Jan, 2014

## 2015-03-26 NOTE — Progress Notes (Signed)
Patient alert and oriented, pain is controlled. Patient is tolerating fluids, advanced to protein shake today, patient is tolerating well.  Reviewed Gastric sleeve discharge instructions with patient and patient is able to articulate understanding.  Provided information on BELT program, Support Group and [facility] outpatient pharmacy. All questions answered, will continue to monitor.  

## 2015-03-30 ENCOUNTER — Telehealth (HOSPITAL_COMMUNITY): Payer: Self-pay

## 2015-03-30 NOTE — Telephone Encounter (Signed)
Made discharge phone call to patient per DROP protocol. Asking the following questions.    1. Do you have someone to care for you now that you are home?  yes 2. Are you having pain now that is not relieved by your pain medication? yes  3. Are you able to drink the recommended daily amount of fluids (48 ounces minimum/day) and protein (60-80 grams/day) as prescribed by the dietitian or nutritional counselor?  yes 4. Are you taking the vitamins and minerals as prescribed?  yes 5. Do you have the "on call" number to contact your surgeon if you have a problem or question?  yes 6. Are your incisions free of redness, swelling or drainage? (If steri strips, address that these can fall off, shower as tolerated) yes 7. Have your bowels moved since your surgery?  If not, are you passing gas?  No, yes, recommended milk of magnesia patient had tried miralax yesterday 8. Are you up and walking 3-4 times per day?  yes

## 2015-04-07 ENCOUNTER — Encounter: Payer: BLUE CROSS/BLUE SHIELD | Attending: Surgery

## 2015-04-07 DIAGNOSIS — Z6841 Body Mass Index (BMI) 40.0 and over, adult: Secondary | ICD-10-CM | POA: Diagnosis not present

## 2015-04-07 DIAGNOSIS — Z01818 Encounter for other preprocedural examination: Secondary | ICD-10-CM | POA: Diagnosis present

## 2015-04-07 DIAGNOSIS — Z713 Dietary counseling and surveillance: Secondary | ICD-10-CM | POA: Diagnosis not present

## 2015-04-07 NOTE — Addendum Note (Signed)
Addended by: Delmer Islam on: 04/07/2015 05:33 PM   Modules accepted: Medications

## 2015-04-07 NOTE — Progress Notes (Signed)
Bariatric Class:  Appt start time: 1530 end time:  1630.  2 Week Post-Operative Nutrition Class  Patient was seen on 04/07/2015 for Post-[facility]ative [facility].   Surgery date: 03/24/15 Surgery type: Sleeve gastrectomy Start weight at [facility]: 252.5 on 02/18/15 Weight today: 239.5 lbs  Weight change: 14 lbs  TANITA  BODY COMP RESULTS  03/02/15 04/07/15   BMI (kg/m^2) 47.9 45.3   Fat Mass (lbs) 129.5 110.0   Fat Free Mass (lbs) 124 129.5   Total Body Water (lbs) 91 95.0    The following the learning objectives were met by the patient during this course:  Identifies Phase 3A (Soft, High Proteins) Dietary Goals and will begin from 2 weeks post-operatively to 2 months post-operatively  Identifies appropriate sources of fluids and proteins   States protein recommendations and appropriate sources post-operatively  Identifies the need for appropriate texture modifications, mastication, and bite sizes when consuming solids  Identifies appropriate multivitamin and calcium sources post-operatively  Describes the need for physical activity post-operatively and will follow MD recommendations  States when to call healthcare provider regarding medication questions or post-operative complications  Handouts given during class include:  Phase 3A: Soft, High Protein Diet Handout  Follow-Up Plan: Patient will follow-up at [facility] in 6 weeks for 2 month post-op nutrition visit for diet advancement per MD.    

## 2015-05-11 ENCOUNTER — Ambulatory Visit (HOSPITAL_COMMUNITY): Admission: RE | Admit: 2015-05-11 | Payer: BLUE CROSS/BLUE SHIELD | Source: Ambulatory Visit | Admitting: Surgery

## 2015-05-11 ENCOUNTER — Encounter (HOSPITAL_COMMUNITY): Admission: RE | Payer: Self-pay | Source: Ambulatory Visit

## 2015-05-11 SURGERY — REVISION, GASTRIC BAND, LAPAROSCOPIC
Anesthesia: General

## 2015-05-20 ENCOUNTER — Encounter: Payer: Self-pay | Admitting: Dietician

## 2015-05-20 ENCOUNTER — Encounter: Payer: BLUE CROSS/BLUE SHIELD | Attending: Surgery | Admitting: Dietician

## 2015-05-20 DIAGNOSIS — Z6841 Body Mass Index (BMI) 40.0 and over, adult: Secondary | ICD-10-CM | POA: Insufficient documentation

## 2015-05-20 DIAGNOSIS — Z01818 Encounter for other preprocedural examination: Secondary | ICD-10-CM | POA: Diagnosis not present

## 2015-05-20 DIAGNOSIS — Z713 Dietary counseling and surveillance: Secondary | ICD-10-CM | POA: Insufficient documentation

## 2015-05-20 NOTE — Patient Instructions (Addendum)
Goals:  Follow Phase 3B: High Protein + Non-Starchy Vegetables  Eat 3-6 small meals/snacks, every 3-5 hrs  Increase lean protein foods to meet 60g goal  Increase fluid intake to 64oz +  Avoid drinking 15 minutes before, during and 30 minutes after eating  Aim for >30 min of physical activity daily  Make sure your vitamin says "complete" and take a double dose  Try Citracal Petites    TANITA  BODY COMP RESULTS  03/02/15 04/07/15 05/20/15   BMI (kg/m^2) 47.9 45.3 42.5   Fat Mass (lbs) 129.5 110.0 102.5   Fat Free Mass (lbs) 124 129.5 122.5   Total Body Water (lbs) 91 95.0 89.5

## 2015-05-20 NOTE — Progress Notes (Signed)
  Follow-up visit:  8 Weeks Post-Operative Sleeve gastrectomy Surgery  Medical Nutrition Therapy:  Appt start time: 1025 end time:  1100.  Primary concerns today: Post-operative Bariatric Surgery Nutrition Management. Carla Rogers returns today having lost 14 pounds since post op class. Feels like weight loss has been slow and she feels depressed about it. Has not been sick at all and has felt hungry. Tolerating all recommended foods. Has not had any vomiting and feels like she could "push her boundaries."    Surgery date: 03/24/15 Surgery type: Sleeve gastrectomy [facility] on 02/18/15 Weight today: 225 lbs Weight change: 14.5 lbs Total weight lost: 27.5 lbs  TANITA  BODY COMP RESULTS  03/02/15 04/07/15 05/20/15   BMI (kg/m^2) 47.9 45.3 42.5   Fat Mass (lbs) 129.5 110.0 102.5   Fat Free Mass (lbs) 124 129.5 122.5   Total Body Water (lbs) 91 95.0 89.5    Preferred Learning Style:   No preference indicated   Learning Readiness:   Ready  24-hr recall: B (AM): boiled or poached egg, sometimes with cottage cheese (7-10g)  Snk (AM): protein shake (20-30g)  L (PM): [employer]'s chili or cheese and lunchmeat Snk (PM): beef jerky or cheese stick   D (PM): 3 oz meat or BBQ (21g) Snk (PM): yogurt  Fluid intake: water, protein shake (80 oz) Estimated total protein intake: 80+ grams per day  Medications: reduced diabetes and diabetes medications Supplementation: taking  CBG monitoring: most mornings Average CBG per patient: 80-90s Last patient reported A1c: 6.4%  Using straws: yes Drinking while eating: sometimes in the mornings Hair loss: none Carbonated beverages: no N/V/D/C: constipation, taking Miralax, stool softeners, and fiber supplement Dumping syndrome: none  Recent physical activity:  Walking, inconsistent  Progress Towards Goal(s):  In progress.  Handouts given during visit include:  Phase 3B lean protein + non starchy vegetables   Nutritional Diagnosis:   [place]-3.3 Overweight/obesity related to past poor dietary habits and physical inactivity as evidenced by patient w/ recent sleeve gastrectomy surgery following dietary guidelines for continued weight loss.     Intervention:  Nutrition counseling provided.  Teaching Method Utilized:  Visual Auditory Hands on  Barriers to learning/adherence to lifestyle change: none  Demonstrated degree of understanding via:  Teach Back   Monitoring/Evaluation:  Dietary intake, exercise, and body weight. Follow up in 6 weeks for 3.5 month post-op visit.

## 2015-07-02 ENCOUNTER — Encounter: Payer: BLUE CROSS/BLUE SHIELD | Attending: Surgery | Admitting: Dietician

## 2015-07-02 ENCOUNTER — Encounter: Payer: Self-pay | Admitting: Dietician

## 2015-07-02 DIAGNOSIS — Z6841 Body Mass Index (BMI) 40.0 and over, adult: Secondary | ICD-10-CM | POA: Diagnosis not present

## 2015-07-02 DIAGNOSIS — Z01818 Encounter for other preprocedural examination: Secondary | ICD-10-CM | POA: Insufficient documentation

## 2015-07-02 DIAGNOSIS — Z713 Dietary counseling and surveillance: Secondary | ICD-10-CM | POA: Diagnosis not present

## 2015-07-02 NOTE — Patient Instructions (Addendum)
Goals:  Follow Phase 3B: High Protein + Non-Starchy Vegetables  Eat 3-6 small meals/snacks, every 3-5 hrs  Increase lean protein foods to meet 60g goal  Increase fluid intake to 64oz +  Avoid drinking 15 minutes before, during and 30 minutes after eating  Try Citracal Petites   Follow up with counselor  Think about attending bariatric support group   Start logging foods again  Get back into exercise routine  Consider an additional vitamin D supplement (2,000 IU per day)   TANITA  BODY COMP RESULTS  03/02/15 04/07/15 05/20/15 07/02/15   BMI (kg/m^2) 47.9 45.3 42.5 40.6   Fat Mass (lbs) 129.5 110.0 102.5 93   Fat Free Mass (lbs) 124 129.5 122.5 122   Total Body Water (lbs) 91 95.0 89.5 89.5

## 2015-07-02 NOTE — Progress Notes (Signed)
  Follow-up visit:  3.5 months Post-Operative Sleeve gastrectomy Surgery  Medical Nutrition Therapy:  Appt start time: 1045 end time:  1115  Primary concerns today: Post-operative Bariatric Surgery Nutrition Management. Carla Rogers returns today having lost another 10 pounds. She has noticed that her weight loss has slowed and states "not doing like I need to do." Feels like she can eat a lot more than she should be able to and feeling hungry a lot. Had an episode of vomiting and diarrhea with too much caramel popcorn. Feels like she is meeting protein needs.    Surgery date: 03/24/15 Surgery type: Sleeve gastrectomy [facility]: 252.5 on 02/18/15 Weight today: 215 lbs Weight change: 10 lbs Total weight lost: 37.5 lbs  TANITA  BODY COMP RESULTS  03/02/15 04/07/15 05/20/15 07/02/15   BMI (kg/m^2) 47.9 45.3 42.5 40.6   Fat Mass (lbs) 129.5 110.0 102.5 93   Fat Free Mass (lbs) 124 129.5 122.5 122   Total Body Water (lbs) 91 95.0 89.5 89.5    Preferred Learning Style:   No preference indicated   Learning Readiness:   Ready  24-hr recall: B (AM): Protein shake (30g) Snk (AM):   L (PM): Wendy's chili or 4 oz meat and vegetable (14-28g) Snk (PM): beef jerky or cheese stick   D (PM): 3 oz meat or BBQ (21g) Snk (PM): yogurt  Fluid intake: water, protein shake (80 oz) Estimated total protein intake: 80+ grams per day  Medications: reduced diabetes and diabetes medications Supplementation: taking  CBG monitoring: most mornings Average CBG per patient: 95-100 Last patient reported A1c: 6.4%  Using straws: yes Drinking while eating: sometimes in the mornings Hair loss: none Carbonated beverages: no N/V/D/C: constipation is better; stool softeners and fiber supplement Dumping syndrome: none  Recent physical activity:  None  Progress Towards Goal(s):  In progress.   Nutritional Diagnosis:  [place]-3.3 Overweight/obesity related to past poor dietary habits and physical inactivity  as evidenced by patient w/ recent sleeve gastrectomy surgery following dietary guidelines for continued weight loss.   Intervention:  Nutrition counseling provided.  Teaching Method Utilized:  Visual Auditory Hands on  Barriers to learning/adherence to lifestyle change: none  Demonstrated degree of understanding via:  Teach Back   Monitoring/Evaluation:  Dietary intake, exercise, and body weight. Follow up in 6 weeks for 3.5 month post-op visit.

## 2015-08-24 ENCOUNTER — Ambulatory Visit: Payer: BLUE CROSS/BLUE SHIELD | Admitting: Dietician

## 2015-10-16 ENCOUNTER — Encounter: Payer: Managed Care, Other (non HMO) | Attending: Surgery | Admitting: Dietician

## 2015-10-16 ENCOUNTER — Encounter: Payer: Self-pay | Admitting: Dietician

## 2015-10-16 DIAGNOSIS — Z713 Dietary counseling and surveillance: Secondary | ICD-10-CM | POA: Insufficient documentation

## 2015-10-16 NOTE — Progress Notes (Signed)
  Follow-up visit:  7 months Post-Operative Sleeve gastrectomy Surgery  Medical Nutrition Therapy:  Appt start time: 930 end time:  1005  Primary concerns today: Post-operative Bariatric Surgery Nutrition Management. Carla Rogers returns today having lost another 17 pounds in the last 3.5 months. Reports that she misses sandwiches and sweets and admits to eating foods that are not recommended. Has not been drinking protein shakes but states that she is meeting protein needs. Eating more than she feels like she should and feeling hungry frequently. Has had some regurgitation from eating too much.    Surgery date: 03/24/15 Surgery type: Sleeve gastrectomy [facility] on [facility]18/15 (highest weight 297 lbs per patient)  Weight today: 198 lbs Weight change: 17 lbs Total weight lost: 99 lbs  TANITA  BODY COMP RESULTS  03/02/15 04/07/15 05/20/15 07/02/15 10/16/15   BMI (kg/m^2) 47.9 45.3 42.5 40.6 37.4   Fat Mass (lbs) 129.5 110.0 102.5 93 80   Fat Free Mass (lbs) 124 129.5 122.5 122 118   Total Body Water (lbs) 91 95.0 89.5 89.5 86.5    Preferred Learning Style:   No preference indicated   Learning Readiness:   Ready  24-hr recall: B (AM): 2 eggs and sometimes meat (14g) Snk (AM):   L (PM): Wendy's chili or or lunch meat or salad with meat (14-21g) Snk (PM): beef jerky or cheese stick   D (PM): 3 oz meat or BBQ (21g) Snk (PM): cookies  Fluid intake: water, protein shake (80 oz) Estimated total protein intake: 80+ grams per day  Medications: reduced diabetes and diabetes medications Supplementation: taking  CBG monitoring: most mornings Average CBG per patient: 95-100 Last patient reported A1c: 6.4%  Using straws: yes Drinking while eating: sometimes in the mornings Hair loss: none Carbonated beverages: no N/V/D/C: constipation is better; stool softeners and fiber supplement Dumping syndrome: none  Recent physical activity:  None  Progress Towards Goal(s):  In  progress.   Nutritional Diagnosis:  [place]-3.3 Overweight/obesity related to past poor dietary habits and physical inactivity as evidenced by patient w/ recent sleeve gastrectomy surgery following dietary guidelines for continued weight loss.   Intervention:  Nutrition counseling provided.  Teaching Method Utilized:  Visual Auditory Hands on  Barriers to learning/adherence to lifestyle change: none  Demonstrated degree of understanding via:  Teach Back   Monitoring/Evaluation:  Dietary intake, exercise, and body weight. Follow up in 3 months for 10 month post-op visit.

## 2015-10-16 NOTE — Patient Instructions (Addendum)
Goals:  Follow Phase 3B: High Protein + Non-Starchy Vegetables  Eat 3-6 small meals/snacks, every 3-5 hrs  Increase lean protein foods to meet 60g goal  Increase fluid intake to 64oz +  Avoid drinking 15 minutes before, during and 30 minutes after eating   Follow up with counselor and surgeon  Get back into exercise routine  Work on eating slowly and mindfully!  Pre portion foods  Surgery date: 03/24/15 Surgery type: Sleeve gastrectomy [facility]: 252.5 on 02/18/15 (highest weight 297 lbs per patient)  Weight today: 198 lbs Weight change: 17 lbs Total weight lost: 99 lbs  TANITA  BODY COMP RESULTS  03/02/15 04/07/15 05/20/15 07/02/15 10/16/15   BMI (kg/m^2) 47.9 45.3 42.5 40.6 37.4   Fat Mass (lbs) 129.5 110.0 102.5 93 80   Fat Free Mass (lbs) 124 129.5 122.5 122 118   Total Body Water (lbs) 91 95.0 89.5 89.5 86.5

## 2016-01-15 ENCOUNTER — Ambulatory Visit: Payer: BLUE CROSS/BLUE SHIELD | Admitting: Dietician

## 2016-12-21 IMAGING — CR DG UGI W/ GASTROGRAFIN
1 series · 1 of 1 positions shown · IV contrast (omnipaque)
Comparison: 02/23/2015.

CLINICAL DATA: Postop gastric sleeve yesterday.

EXAM:
WATER SOLUBLE UPPER GI SERIES
TECHNIQUE: Single-column upper GI series was performed using water soluble
contrast.
CONTRAST:  50 cc Omnipaque.

[view not recorded]
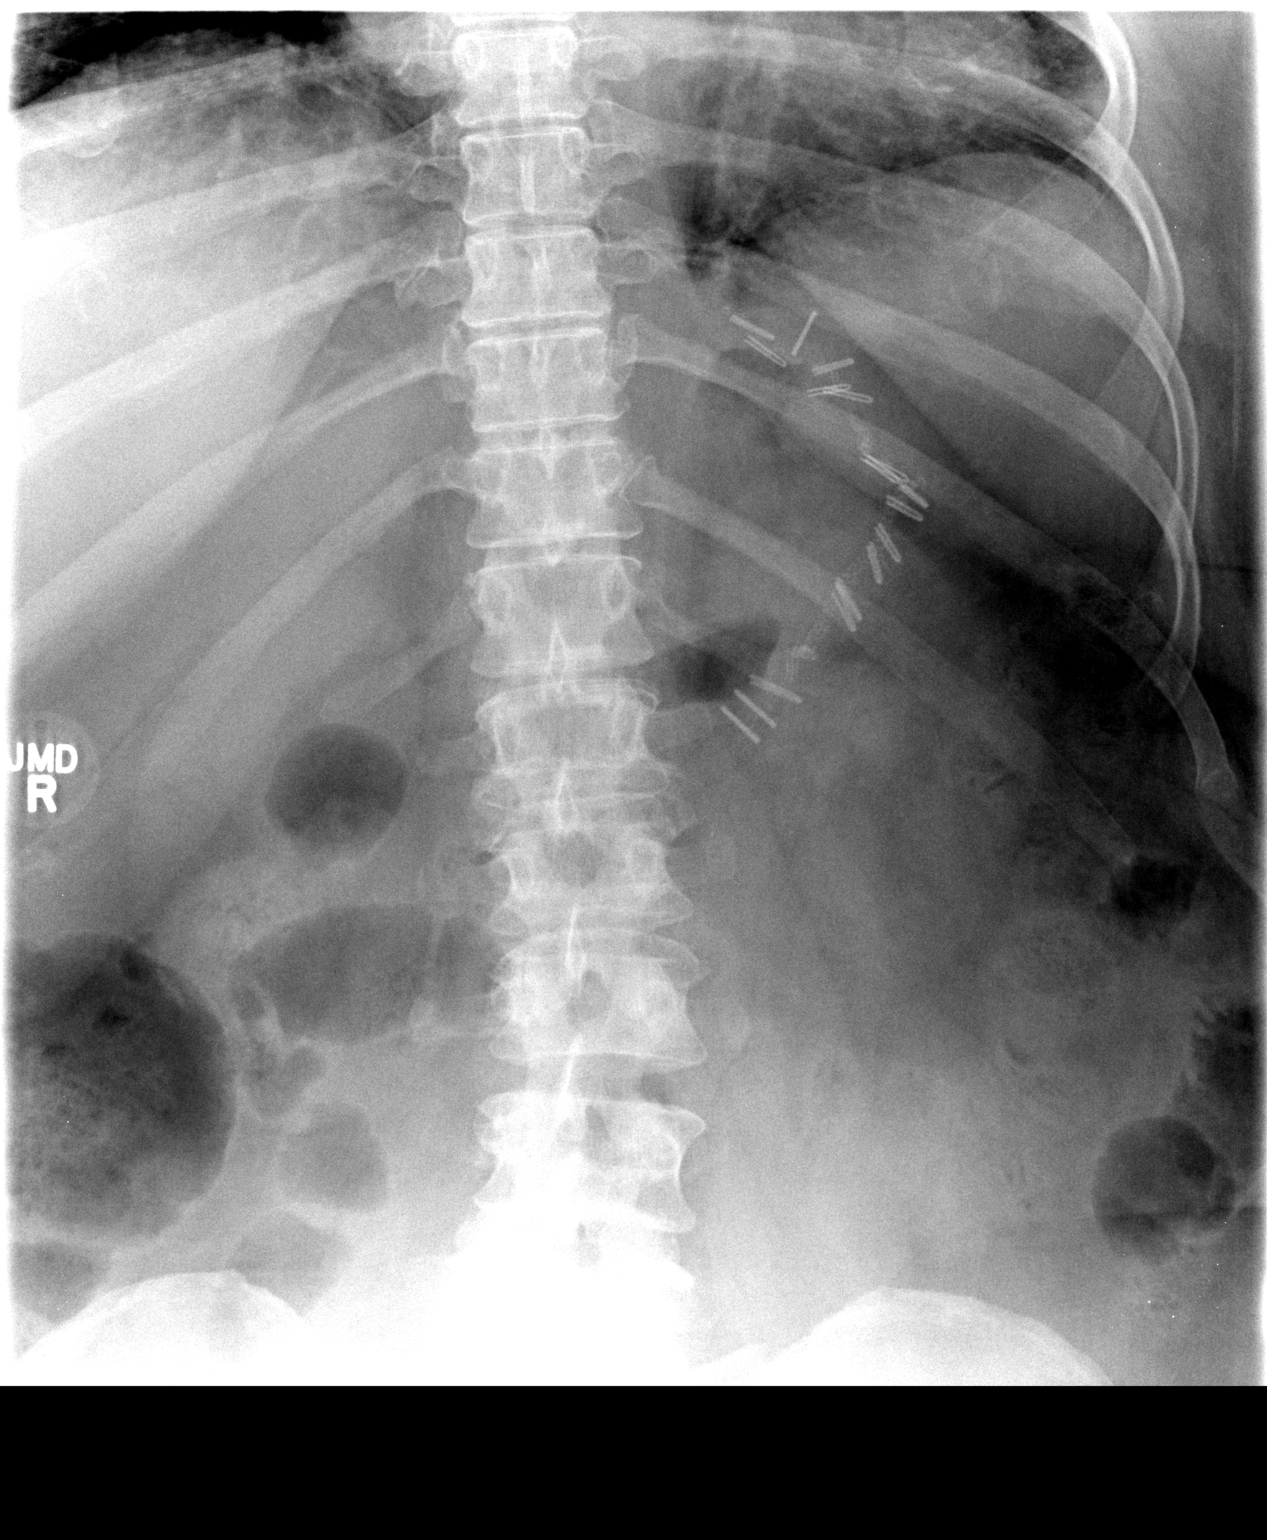

[1 of 1 positions shown; findings below may reference images not displayed]

FLUOROSCOPY TIME:  Radiation Exposure Index (as provided by the
fluoroscopic device):

If the device does not provide the exposure index:

Fluoroscopy Time (in minutes and seconds):  0 minutes 23 seconds.

Number of Acquired Images:  3
FINDINGS: Scout view of the abdomen shows postoperative changes in the left
upper quadrant of gastric sleeve procedure. Bowel gas pattern is
unremarkable.

Patient drank 50 cc Omnipaque. Contrast flowed readily through the
residual stomach into small bowel. No leak.
IMPRESSION: Normal postoperative day 1 appearance of a gastric sleeve procedure.

## 2017-03-16 ENCOUNTER — Ambulatory Visit: Payer: Managed Care, Other (non HMO) | Attending: Orthopedic Surgery | Admitting: Physical Therapy

## 2017-03-16 ENCOUNTER — Encounter: Payer: Self-pay | Admitting: Physical Therapy

## 2017-03-16 DIAGNOSIS — M6281 Muscle weakness (generalized): Secondary | ICD-10-CM | POA: Insufficient documentation

## 2017-03-16 DIAGNOSIS — M25562 Pain in left knee: Secondary | ICD-10-CM | POA: Diagnosis not present

## 2017-03-16 DIAGNOSIS — R262 Difficulty in walking, not elsewhere classified: Secondary | ICD-10-CM | POA: Diagnosis present

## 2017-03-16 NOTE — Patient Instructions (Signed)
PIRIFORMIS STRETCH MODIFIED 3  While lying on your back and leg crossed on top of your opposite knee, hold your knee with your opposite hand and bring your knee up and over across your midline towards your opposite shoulder for a stretch felt in the buttock.     Hamstring Set: push heel into mat/bed and pull back slightly, allowing your hamstring muscle to tighten, Hold 5 seconds Repeat 15 times Twice a day   Hamstring Curls: Pull your heel toward your bottom Hold 2 seconds Repeat 15 times Twice a day

## 2017-03-16 NOTE — Therapy (Signed)
[facility] [address] [place], [place], [address] Phone: [phone]   Fax:  [phone]  Physical Therapy Evaluation  Patient Details  Name: LUCAS EXLINE MRN: [id number] Date of Birth: [birth year] Referring Provider: Dr. Jamelle Haring Daws  Encounter Date: 03/16/2017      PT End of Session - 03/16/17 1249    Visit Number 1   Number of Visits 16   PT Start Time 1115   PT Stop Time 1210   PT Time Calculation (min) 55 min   Activity Tolerance Patient tolerated treatment well   Behavior During Therapy [facility] for tasks assessed/performed      Past Medical History:  Diagnosis Date  . Anxiety   . Complication of anesthesia    slow to wake up   . Depression   . Diabetes mellitus without complication (HCC)   . GERD (gastroesophageal reflux disease)   . Hypertension   . Overactive bladder   . PONV (postoperative nausea and vomiting)   . Sleep apnea    cpap     Past Surgical History:  Procedure Laterality Date  . CHOLECYSTECTOMY    . COLONOSCOPY    . exploratory vaginal surgery     . LAPAROSCOPIC GASTRIC SLEEVE RESECTION N/A 03/24/2015   Procedure: LAPAROSCOPIC GASTRIC SLEEVE RESECTION UPPER ENDOSCOPY;  Surgeon: Ovidio Kin, MD;  Location: [facility];  Service: General;  Laterality: N/A;  . LAPAROSCOPY    . pilonidal cyst removal     . UPPER GI ENDOSCOPY  03/24/2015   Procedure: UPPER GI ENDOSCOPY;  Surgeon: Ovidio Kin, MD;  Location: [facility]S;  Service: General;;    There were no vitals filed for this visit.       Subjective Assessment - 03/16/17 1407    Subjective Pt arriving to therapy reporting trauma to her feet and knees from her daughter running into her when she was sliding down a slide. Pt's feet were hit by her daughter forcing her feet/toes into extension/df. Pt reporting 10/10 pain when the incident occurred. Approximately 2 weeks later pt noted increased posterior knee pain where she was having more difficutly with amb. Pt  beleives she may have hyperextended her knee when her daughter ran into her.    Pertinent History genu recurvatum , DM   Limitations Walking;House hold activities   How long can you sit comfortably? unlimited   How long can you stand comfortably? 5 minutes   How long can you walk comfortably? 5 minutes   Patient Stated Goals I want to be able to keep up with my 33month old and 2 other children.    Currently in Pain? Yes   Pain Score 4    Pain Location Knee   Pain Orientation Left   Pain Descriptors / Indicators Aching;Sore   Pain Type Acute pain   Pain Onset More than a month ago   Aggravating Factors  bending the knee, walking   Pain Relieving Factors resting            [facility] PT Assessment - 03/16/17 0001      Assessment   Medical Diagnosis left hamstring injury   Referring Provider Dr. Jamelle Haring Daws   Onset Date/Surgical Date 01/28/17   Hand Dominance Right   Next MD Visit 04/04/17   Prior Therapy yes, shoulder, several years ago     Precautions   Precautions None   Required Braces or Orthoses Other Brace/Splint   Other Brace/Splint knee support     Balance Screen  Has the patient fallen in the past 6 months No   Is the patient reluctant to leave their home because of a fear of falling?  No     Home Nurse, mental health Private residence   Living Arrangements Spouse/significant other;Children   Available Help at Discharge Family   Type of Home House   Home Access Stairs to enter   Entrance Stairs-Number of Steps 1   Entrance Stairs-Rails Right   Home Layout One level   Home Equipment None     Prior Function   Level of Independence Independent   Vocation Unemployed   Leisure play with my kids, family time     Cognition   Overall Cognitive Status Within Functional Limits for tasks assessed     ROM / Strength   AROM / PROM / Strength AROM;PROM;Strength     AROM   Overall AROM  Deficits   AROM Assessment Site Knee   Right/Left Knee Right;Left    Right Knee Extension -3   Right Knee Flexion 120   Left Knee Extension -3   Left Knee Flexion 98     PROM   PROM Assessment Site Knee   Right/Left Knee Right;Left   Right Knee Extension -5   Right Knee Flexion 125   Left Knee Extension -5   Left Knee Flexion 100     Strength   Overall Strength Deficits   Strength Assessment Site Knee   Right/Left Knee Right;Left   Right Knee Flexion 5/5   Right Knee Extension 5/5   Left Knee Flexion 3/5  limited by pain   Left Knee Extension 3/5  limited by pain     Flexibility   Soft Tissue Assessment /Muscle Length yes   Hamstrings R: 97 degrees, L: 88 degrees     Palpation   Palpation comment Tenderness noted over  left posterior hamstring tendons and left pififormis     Ambulation/Gait   Ambulation/Gait Yes   Ambulation Distance (Feet) 40 Feet   Assistive device None   Gait Pattern Step-through pattern;Decreased dorsiflexion - left;Antalgic;Wide base of support;Poor foot clearance - left   Ambulation Surface Level            Objective measurements completed on examination: See above findings.          [facility] Adult PT Treatment/Exercise - 03/16/17 0001      Exercises   Exercises Knee/Hip     Knee/Hip Exercises: Stretches   Active Hamstring Stretch Both;2 reps;30 seconds   Piriformis Stretch Left;2 reps;20 seconds     Knee/Hip Exercises: Supine   Other Supine Knee/Hip Exercises  hamstring sets x 5 holding 5 seconds each     Knee/Hip Exercises: Prone   Hamstring Curl 5 reps                PT Education - 03/16/17 1248    Education provided Yes   Education Details HEP   Person(s) Educated Patient   Methods Explanation;Demonstration;Verbal cues;Handout   Comprehension Verbalized understanding;Returned demonstration          PT Short Term Goals - 03/16/17 1425      PT SHORT TERM GOAL #1   Title pt will be independent with her initial HEP.    Time 3   Period Weeks   Status New   Target Date  04/06/17           PT Long Term Goals - 03/16/17 1422      PT LONG TERM GOAL #  1   Title Pt will improve her left LE knee flexion and extension strength to grossly 4+/5 in order to improve gait and functional mobility.    Time 8   Period Weeks   Status New   Target Date 05/11/17     PT LONG TERM GOAL #2   Title Pt will improve her gait to step through gait pattern with equalized step length and heel to toe gait pattern for > 1000 feet on community level surfaces.    Time 8   Period Weeks   Status New   Target Date 05/11/17     PT LONG TERM GOAL #3   Title Pt will report no pain with ADL's.    Time 8   Period Weeks   Status New   Target Date 05/11/17                Plan - 03/16/17 1250    Clinical Impression Statement Pt is a 41 year old mother of 3 who presents to therapy after experiencing a hamstring injury following trauma where her daughter ran into her when coming down a slide. Pt believes her knees may have hyperextended. 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  Pt presenting with left knee weakness and pain of 5-6/10. Pt also with antalgic gait pattern. ROM -3-110. with pain reported with flexion. Skilled PT needed to address pt's impairments with the below interventions.     History and Personal Factors relevant to plan of care: joint laxity   Clinical Presentation Stable   Clinical Decision Making Low   Rehab Potential Good   PT Frequency 2x / week   PT Duration 8 weeks   PT Treatment/Interventions ADLs/Self Care Home Management;Electrical Stimulation;Iontophoresis 4mg /ml Dexamethasone;Ultrasound;Passive range of motion;Neuromuscular re-education;Balance training;Therapeutic exercise;Therapeutic activities;Functional mobility training;Gait training;Stair training;Patient/family education;Manual techniques;Dry needling;Taping   PT Next Visit Plan LE ROM and strenthening, E-stim, STW to hamstring tendons, posterior knee capsule   PT  Home Exercise Plan hamstring sets, hamstring stretch, knee to opposite shoulder, prone hamstring curls   Consulted and Agree with Plan of Care Patient      Patient will benefit from skilled therapeutic intervention in order to improve the following deficits and impairments:  Abnormal gait, Pain, Impaired flexibility  Visit Diagnosis: Acute pain of left knee  Difficulty in walking, not elsewhere classified  Muscle weakness (generalized)     Problem List Patient Active Problem List   Diagnosis Date Noted  . Morbid obesity (HCC) 03/24/2015    Sharmon LeydenJennifer R Alainna Stawicki, MPT 03/16/2017, 2:36 PM  [facility] [address] [place], [place], [address] Phone: [phone]   Fax:  [phone]  Name: Sheppard PlumberDeidra K Hilmes MRN: [id number] Date of Birth: 06/08/1976

## 2017-03-20 ENCOUNTER — Ambulatory Visit: Payer: Managed Care, Other (non HMO) | Admitting: Physical Therapy

## 2017-03-20 DIAGNOSIS — M25562 Pain in left knee: Secondary | ICD-10-CM

## 2017-03-20 DIAGNOSIS — M6281 Muscle weakness (generalized): Secondary | ICD-10-CM

## 2017-03-20 DIAGNOSIS — R262 Difficulty in walking, not elsewhere classified: Secondary | ICD-10-CM

## 2017-03-20 NOTE — Therapy (Signed)
[facility] [address] [place], [place], [address] Phone: [phone]   Fax:  [phone]  Physical Therapy Treatment  Patient Details  Name: Carla Rogers MRN: [id number] Date of Birth: May 19, 1976 Referring Provider: Dr. Jamelle Haring Daws  Encounter Date: 03/20/2017      PT End of Session - 03/20/17 1513    Visit Number 2   Number of Visits 16   Date for PT Re-Evaluation 05/11/17   PT Start Time 1200   PT Stop Time 1252   PT Time Calculation (min) 52 min   Activity Tolerance Patient tolerated treatment well   Behavior During Therapy [facility] for tasks assessed/performed      Past Medical History:  Diagnosis Date  . Anxiety   . Complication of anesthesia    slow to wake up   . Depression   . Diabetes mellitus without complication (HCC)   . GERD (gastroesophageal reflux disease)   . Hypertension   . Overactive bladder   . PONV (postoperative nausea and vomiting)   . Sleep apnea    cpap     Past Surgical History:  Procedure Laterality Date  . CHOLECYSTECTOMY    . COLONOSCOPY    . exploratory vaginal surgery     . LAPAROSCOPIC GASTRIC SLEEVE RESECTION N/A 03/24/2015   Procedure: LAPAROSCOPIC GASTRIC SLEEVE RESECTION UPPER ENDOSCOPY;  Surgeon: Ovidio Kin, MD;  Location: [facility];  Service: General;  Laterality: N/A;  . LAPAROSCOPY    . pilonidal cyst removal     . UPPER GI ENDOSCOPY  03/24/2015   Procedure: UPPER GI ENDOSCOPY;  Surgeon: Ovidio Kin, MD;  Location: [facility];  Service: General;;    There were no vitals filed for this visit.      Subjective Assessment - 03/20/17 1213    Subjective I did the exercises but then i was up all night with pain.     Pertinent History genu recurvatum , DM   Limitations Walking;House hold activities   How long can you sit comfortably? unlimited   How long can you stand comfortably? 5 minutes   How long can you walk comfortably? 5 minutes   Pain Score 4    Pain Location Knee   Pain  Orientation Left   Pain Descriptors / Indicators Aching;Sore   Pain Type Acute pain   Pain Onset More than a month ago                         [facility] Adult PT Treatment/Exercise - 03/20/17 0001      Modalities   Modalities Electrical Stimulation;Ultrasound     Electrical Stimulation   Electrical Stimulation Location Left distal hamstrings/superior calf region.   Electrical Stimulation Action IFC   Electrical Stimulation Parameters 80-150 Hz x 20 minutes.   Electrical Stimulation Goals Pain     Ultrasound   Ultrasound Location Left distal hams/superior calf   Ultrasound Parameters In prone:  Combo e'stim/U/S at 1.50 W/CM2 x 12 minutes.   Ultrasound Goals Pain     Manual Therapy   Manual Therapy Soft tissue mobilization   Soft tissue mobilization In prone:  STW/M to left superior calf region/popliteal fossa region which was her main c/o pain today x 12 minutes.                  PT Short Term Goals - 03/16/17 1425      PT SHORT TERM GOAL #1   Title pt will be independent  with her initial HEP.    Time 3   Period Weeks   Status New   Target Date 04/06/17           PT Long Term Goals - 03/16/17 1422      PT LONG TERM GOAL #1   Title Pt will improve her left LE knee flexion and extension strength to grossly 4+/5 in order to improve gait and functional mobility.    Time 8   Period Weeks   Status New   Target Date 05/11/17     PT LONG TERM GOAL #2   Title Pt will improve her gait to step through gait pattern with equalized step length and heel to toe gait pattern for > 1000 feet on community level surfaces.    Time 8   Period Weeks   Status New   Target Date 05/11/17     PT LONG TERM GOAL #3   Title Pt will report no pain with ADL's.    Time 8   Period Weeks   Status New   Target Date 05/11/17               Plan - 03/20/17 1514    Clinical Impression Statement Patient did well with treatment today with less pain reports  after.  She was palpable sore in her left superior calf region today.      Patient will benefit from skilled therapeutic intervention in order to improve the following deficits and impairments:  Abnormal gait, Pain, Impaired flexibility  Visit Diagnosis: Acute pain of left knee  Difficulty in walking, not elsewhere classified  Muscle weakness (generalized)     Problem List Patient Active Problem List   Diagnosis Date Noted  . Morbid obesity (HCC) 03/24/2015    Cerise Lieber, [place] MPT 03/20/2017, 3:15 PM  [facility] [address] [place], [place], [address] Phone: [phone]   Fax:  [phone]  Name: VIEVA ALVA MRN: [id number] Date of Birth: 18-Nov-1975

## 2017-03-23 ENCOUNTER — Ambulatory Visit: Payer: Managed Care, Other (non HMO) | Admitting: Physical Therapy

## 2017-03-23 DIAGNOSIS — M6281 Muscle weakness (generalized): Secondary | ICD-10-CM

## 2017-03-23 DIAGNOSIS — M25562 Pain in left knee: Secondary | ICD-10-CM | POA: Diagnosis not present

## 2017-03-23 DIAGNOSIS — R262 Difficulty in walking, not elsewhere classified: Secondary | ICD-10-CM

## 2017-03-23 NOTE — Therapy (Signed)
[facility] [address] [place], [place], [address] Phone: [phone]   Fax:  [phone]  Physical Therapy Treatment  Patient Details  Name: ALEXIAS MARGERUM MRN: [id number] Date of Birth: 1976-05-10 Referring Provider: Dr. Jamelle Haring Daws  Encounter Date: 03/23/2017      PT End of Session - 03/23/17 1327    Visit Number 3   Number of Visits 16   Date for PT Re-Evaluation 05/11/17   PT Start Time 1116   PT Stop Time 1206   PT Time Calculation (min) 50 min      Past Medical History:  Diagnosis Date  . Anxiety   . Complication of anesthesia    slow to wake up   . Depression   . Diabetes mellitus without complication (HCC)   . GERD (gastroesophageal reflux disease)   . Hypertension   . Overactive bladder   . PONV (postoperative nausea and vomiting)   . Sleep apnea    cpap     Past Surgical History:  Procedure Laterality Date  . CHOLECYSTECTOMY    . COLONOSCOPY    . exploratory vaginal surgery     . LAPAROSCOPIC GASTRIC SLEEVE RESECTION N/A 03/24/2015   Procedure: LAPAROSCOPIC GASTRIC SLEEVE RESECTION UPPER ENDOSCOPY;  Surgeon: Ovidio Kin, MD;  Location: [facility];  Service: General;  Laterality: N/A;  . LAPAROSCOPY    . pilonidal cyst removal     . UPPER GI ENDOSCOPY  03/24/2015   Procedure: UPPER GI ENDOSCOPY;  Surgeon: Ovidio Kin, MD;  Location: [facility];  Service: General;;    There were no vitals filed for this visit.      Subjective Assessment - 03/23/17 1126    Subjective I was a little achy after that last treatment but did fine.  My knee sometimes does not want to bend when I sit and my knee doesn't fully extend like my other one when I stand.   Pertinent History genu recurvatum , DM   Patient Stated Goals I want to be able to keep up with my 68month old and 2 other children.    Pain Score 4    Pain Location Knee   Pain Orientation Left   Pain Descriptors / Indicators Aching;Sore   Pain Type Acute pain   Pain  Onset More than a month ago                         [facility] Adult PT Treatment/Exercise - 03/23/17 0001      Modalities   Modalities Electrical Stimulation;Moist Heat     Electrical Stimulation   Electrical Stimulation Location left ditsal hams/superior calf.   Electrical Stimulation Action IFC   Electrical Stimulation Parameters 80-150 Hz x 20 minutes.   Electrical Stimulation Goals Tone;Pain     Ultrasound   Ultrasound Location Left distal ham/superior calf.   Ultrasound Parameters In prone:  Combo e'stim/U/S at 1.50 W/CM2 x 12 minutes.   Ultrasound Goals Pain     Manual Therapy   Soft tissue mobilization In prone:  STW/M x 12 minutes.                  PT Short Term Goals - 03/16/17 1425      PT SHORT TERM GOAL #1   Title pt will be independent with her initial HEP.    Time 3   Period Weeks   Status New   Target Date 04/06/17  PT Long Term Goals - 03/16/17 1422      PT LONG TERM GOAL #1   Title Pt will improve her left LE knee flexion and extension strength to grossly 4+/5 in order to improve gait and functional mobility.    Time 8   Period Weeks   Status New   Target Date 05/11/17     PT LONG TERM GOAL #2   Title Pt will improve her gait to step through gait pattern with equalized step length and heel to toe gait pattern for > 1000 feet on community level surfaces.    Time 8   Period Weeks   Status New   Target Date 05/11/17     PT LONG TERM GOAL #3   Title Pt will report no pain with ADL's.    Time 8   Period Weeks   Status New   Target Date 05/11/17               Plan - 03/23/17 1330    Clinical Impression Statement The patient reports "locking" of knee with times of not being able to bend and fully straighten her left knee.        Patient will benefit from skilled therapeutic intervention in order to improve the following deficits and impairments:  Abnormal gait, Pain, Impaired flexibility  Visit  Diagnosis: Acute pain of left knee  Difficulty in walking, not elsewhere classified  Muscle weakness (generalized)     Problem List Patient Active Problem List   Diagnosis Date Noted  . Morbid obesity (HCC) 03/24/2015    Terrin Meddaugh, [place] MPT 03/23/2017, 1:31 PM  [facility] [address] [place], [place], [address] Phone: [phone]   Fax:  [phone]  Name: RIKITA TIPPIE MRN: [id number] Date of Birth: 1976/06/18

## 2017-03-27 ENCOUNTER — Encounter: Payer: Managed Care, Other (non HMO) | Admitting: Physical Therapy

## 2017-03-28 ENCOUNTER — Ambulatory Visit: Payer: Managed Care, Other (non HMO) | Admitting: Physical Therapy

## 2017-03-28 DIAGNOSIS — R262 Difficulty in walking, not elsewhere classified: Secondary | ICD-10-CM

## 2017-03-28 DIAGNOSIS — M25562 Pain in left knee: Secondary | ICD-10-CM | POA: Diagnosis not present

## 2017-03-28 DIAGNOSIS — M6281 Muscle weakness (generalized): Secondary | ICD-10-CM

## 2017-03-28 NOTE — Therapy (Signed)
[facility] [address] [place], [place], [address] Phone: [phone]   Fax:  [phone]  Physical Therapy Treatment  Patient Details  Name: SUPRIYA KLAMMER MRN: [id number] Date of Birth: 02-24-1976 Referring Provider: Dr. Jamelle Haring Daws  Encounter Date: 03/28/2017      PT End of Session - 03/28/17 1057    Visit Number 4   Number of Visits 16   Date for PT Re-Evaluation 05/11/17   PT Start Time 0908   PT Stop Time 1002   PT Time Calculation (min) 54 min   Activity Tolerance Patient tolerated treatment well   Behavior During Therapy [facility] for tasks assessed/performed      Past Medical History:  Diagnosis Date  . Anxiety   . Complication of anesthesia    slow to wake up   . Depression   . Diabetes mellitus without complication (HCC)   . GERD (gastroesophageal reflux disease)   . Hypertension   . Overactive bladder   . PONV (postoperative nausea and vomiting)   . Sleep apnea    cpap     Past Surgical History:  Procedure Laterality Date  . CHOLECYSTECTOMY    . COLONOSCOPY    . exploratory vaginal surgery     . LAPAROSCOPIC GASTRIC SLEEVE RESECTION N/A 03/24/2015   Procedure: LAPAROSCOPIC GASTRIC SLEEVE RESECTION UPPER ENDOSCOPY;  Surgeon: Ovidio Kin, MD;  [facility];  Service: General;  Laterality: N/A;  . LAPAROSCOPY    . pilonidal cyst removal     . UPPER GI ENDOSCOPY  03/24/2015   Procedure: UPPER GI ENDOSCOPY;  Surgeon: Ovidio Kin, MD;  [facility];  Service: General;;    There were no vitals filed for this visit.      Subjective Assessment - 03/28/17 1108    Subjective Last Saturday was the best I have felt since my injury.   Pain Score 3    Pain Location Knee   Pain Orientation Left   Pain Descriptors / Indicators Aching;Sore   Pain Type Acute pain   Pain Onset More than a month ago                         [facility] Adult PT Treatment/Exercise - 03/28/17 0001      Exercises   Exercises Knee/Hip     Knee/Hip Exercises: Aerobic   Stationary Bike Level 1 x 15 minutes.     Modalities   Modalities Electrical Stimulation;Moist Heat     Moist Heat Therapy   Number Minutes Moist Heat 20 Minutes  Left posterior knee.   Moist Heat Location --  Left posterior knee.     Programme researcher, broadcasting/film/video Location left distal hams/proximal calf region.   Electrical Stimulation Action IFC   Electrical Stimulation Parameters 80-150 Hz x 20 minutes.   Electrical Stimulation Goals Tone;Pain     Manual Therapy   Manual Therapy Soft tissue mobilization                  PT Short Term Goals - 03/16/17 1425      PT SHORT TERM GOAL #1   Title pt will be independent with her initial HEP.    Time 3   Period Weeks   Status New   Target Date 04/06/17           PT Long Term Goals - 03/16/17 1422      PT LONG TERM GOAL #1   Title  Pt will improve her left LE knee flexion and extension strength to grossly 4+/5 in order to improve gait and functional mobility.    Time 8   Period Weeks   Status New   Target Date 05/11/17     PT LONG TERM GOAL #2   Title Pt will improve her gait to step through gait pattern with equalized step length and heel to toe gait pattern for > 1000 feet on community level surfaces.    Time 8   Period Weeks   Status New   Target Date 05/11/17     PT LONG TERM GOAL #3   Title Pt will report no pain with ADL's.    Time 8   Period Weeks   Status New   Target Date 05/11/17               Plan - 03/28/17 1200    Clinical Impression Statement Excellent response to the last couple of treatments though the patient continues to c/o "locking"of her left knee.      Patient will benefit from skilled therapeutic intervention in order to improve the following deficits and impairments:  Abnormal gait, Pain, Impaired flexibility  Visit Diagnosis: Acute pain of left knee  Difficulty in walking, not elsewhere  classified  Muscle weakness (generalized)     Problem List Patient Active Problem List   Diagnosis Date Noted  . Morbid obesity (HCC) 03/24/2015    Velencia Lenart, [place] 03/28/2017, 12:02 PM  [facility] [address] [place], [place], [address] Phone: [phone]   Fax:  [phone]  Name: ISHI DANSER MRN: [id number] Date of Birth: 02-05-1976

## 2017-03-30 ENCOUNTER — Ambulatory Visit: Payer: Managed Care, Other (non HMO) | Admitting: Physical Therapy

## 2017-03-30 ENCOUNTER — Encounter: Payer: Self-pay | Admitting: Physical Therapy

## 2017-03-30 DIAGNOSIS — M25562 Pain in left knee: Secondary | ICD-10-CM | POA: Diagnosis not present

## 2017-03-30 DIAGNOSIS — R262 Difficulty in walking, not elsewhere classified: Secondary | ICD-10-CM

## 2017-03-30 DIAGNOSIS — M6281 Muscle weakness (generalized): Secondary | ICD-10-CM

## 2017-03-30 NOTE — Therapy (Addendum)
[facility] [address] [place], [place], [address] Phone: [phone]   Fax:  [phone]  Physical Therapy Treatment  Patient Details  Name: Carla Rogers MRN: [id number] Date of Birth: 1976/02/08 Referring Provider: Dr. Jamelle Haring Daws  Encounter Date: 03/30/2017      PT End of Session - 03/30/17 0825    Visit Number 5   Number of Visits 16   Date for PT Re-Evaluation 05/11/17   PT Start Time 0818   PT Stop Time 0916   PT Time Calculation (min) 58 min   Activity Tolerance Patient tolerated treatment well   Behavior During Therapy [facility] for tasks assessed/performed      Past Medical History:  Diagnosis Date  . Anxiety   . Complication of anesthesia    slow to wake up   . Depression   . Diabetes mellitus without complication (HCC)   . GERD (gastroesophageal reflux disease)   . Hypertension   . Overactive bladder   . PONV (postoperative nausea and vomiting)   . Sleep apnea    cpap     Past Surgical History:  Procedure Laterality Date  . CHOLECYSTECTOMY    . COLONOSCOPY    . exploratory vaginal surgery     . LAPAROSCOPIC GASTRIC SLEEVE RESECTION N/A 03/24/2015   Procedure: LAPAROSCOPIC GASTRIC SLEEVE RESECTION UPPER ENDOSCOPY;  Surgeon: Ovidio Kin, MD;  [facility];  Service: General;  Laterality: N/A;  . LAPAROSCOPY    . pilonidal cyst removal     . UPPER GI ENDOSCOPY  03/24/2015   Procedure: UPPER GI ENDOSCOPY;  Surgeon: Ovidio Kin, MD;  [facility];  Service: General;;    There were no vitals filed for this visit.      Subjective Assessment - 03/30/17 0818    Subjective Reports that she had a good day but still has trouble with knee flexion during walking, or go to sit.    Pertinent History genu recurvatum , DM   Limitations Walking;House hold activities   How long can you sit comfortably? unlimited   How long can you stand comfortably? 5 minutes   How long can you walk comfortably? 5 minutes    Patient Stated Goals I want to be able to keep up with my 60month old and 2 other children.    Currently in Pain? No/denies            [facility] PT Assessment - 03/30/17 0001      Assessment   Medical Diagnosis left hamstring injury   Onset Date/Surgical Date 01/28/17   Hand Dominance Right   Next MD Visit 04/04/17   Prior Therapy yes, shoulder, several years ago     Precautions   Precautions None   Required Braces or Orthoses Other Brace/Splint   Other Brace/Splint knee support                     [facility] Adult PT Treatment/Exercise - 03/30/17 0001      Knee/Hip Exercises: Aerobic   Stationary Bike L1 x18 min     Modalities   Modalities Electrical Stimulation;Moist Heat;Ultrasound     Moist Heat Therapy   Number Minutes Moist Heat 15 Minutes   Moist Heat Location Knee     Electrical Stimulation   Electrical Stimulation Location L medial HS attachment/ popliteus region   Electrical Stimulation Action IFC   Electrical Stimulation Parameters 1-10 hz x15 min   Electrical Stimulation Goals Tone;Pain     Ultrasound  Ultrasound Location L medial HS attachment/ popliteus region   Ultrasound Parameters 1.5 w/cm2, 100%, 1 mhz x10 min   Ultrasound Goals Pain     Manual Therapy   Manual Therapy Soft tissue mobilization   Soft tissue mobilization STW in prone to L medial HS attachment, popliteus to reduce tone and pain                  PT Short Term Goals - 03/16/17 1425      PT SHORT TERM GOAL #1   Title pt will be independent with her initial HEP.    Time 3   Period Weeks   Status New   Target Date 04/06/17           PT Long Term Goals - 03/16/17 1422      PT LONG TERM GOAL #1   Title Pt will improve her left LE knee flexion and extension strength to grossly 4+/5 in order to improve gait and functional mobility.    Time 8   Period Weeks   Status New   Target Date 05/11/17     PT LONG TERM GOAL #2   Title Pt will improve her gait to  step through gait pattern with equalized step length and heel to toe gait pattern for > 1000 feet on community level surfaces.    Time 8   Period Weeks   Status New   Target Date 05/11/17     PT LONG TERM GOAL #3   Title Pt will report no pain with ADL's.    Time 8   Period Weeks   Status New   Target Date 05/11/17               Plan - 03/30/17 0934    Clinical Impression Statement Patient presented in clinic with continued to L posterior knee stiffness and palpable tightness of the L medial HS and popliteus region. Patient continues to experience difficulty with L knee flexion during ambulation and upon attempting to sit from standing. Inflammation noted along L distomedial HS attachment. No progress noted with goals secondary to lack of strength training completed in PT secondary to STW to reduce tone and ROM deficits. Goal progress also lacking secondary to ambulation deficits and pain with ADLs. Normal modalities response noted following removal of the modalities.   Rehab Potential Good   PT Frequency 2x / week   PT Duration 8 weeks   PT Treatment/Interventions ADLs/Self Care Home Management;Electrical Stimulation;Iontophoresis 4mg /ml Dexamethasone;Ultrasound;Passive range of motion;Neuromuscular re-education;Balance training;Therapeutic exercise;Therapeutic activities;Functional mobility training;Gait training;Stair training;Patient/family education;Manual techniques;Dry needling;Taping   PT Next Visit Plan Continue per MD discretion.   PT Home Exercise Plan hamstring sets, hamstring stretch, knee to opposite shoulder, prone hamstring curls   Consulted and Agree with Plan of Care Patient      Patient will benefit from skilled therapeutic intervention in order to improve the following deficits and impairments:  Abnormal gait, Pain, Impaired flexibility  Visit Diagnosis: Acute pain of left knee  Difficulty in walking, not elsewhere classified  Muscle weakness  (generalized)     Problem List Patient Active Problem List   Diagnosis Date Noted  . Morbid obesity (HCC) 03/24/2015    Florence CannerKelsey Parsons, PTA Narda AmberJennifer Martin, PT 04/17/17 1:42 PM    03/30/17 10:28 AM  [facility] [address] [place], [place], [address] Phone: [phone]   Fax:  [phone]  Name: Carla Rogers MRN: [id number] Date of Birth: 06/07/1976

## 2017-04-05 ENCOUNTER — Encounter: Payer: Self-pay | Admitting: Physical Therapy

## 2017-04-05 ENCOUNTER — Ambulatory Visit: Payer: Managed Care, Other (non HMO) | Attending: Orthopedic Surgery | Admitting: Physical Therapy

## 2017-04-05 DIAGNOSIS — M25562 Pain in left knee: Secondary | ICD-10-CM | POA: Diagnosis present

## 2017-04-05 DIAGNOSIS — R262 Difficulty in walking, not elsewhere classified: Secondary | ICD-10-CM | POA: Diagnosis present

## 2017-04-05 DIAGNOSIS — M6281 Muscle weakness (generalized): Secondary | ICD-10-CM | POA: Insufficient documentation

## 2017-04-05 NOTE — Therapy (Signed)
[facility] [address] [place], [place], [address] Phone: [phone]   Fax:  [phone]  Physical Therapy Treatment  Patient Details  Name: Carla Rogers MRN: [id number] Date of Birth: 17-Mar-1976 Referring Provider: Dr. Jamelle Haring Daws  Encounter Date: 04/05/2017      PT End of Session - 04/05/17 1406    Visit Number 6   Number of Visits 16   PT Start Time 0145   PT Stop Time 0233   PT Time Calculation (min) 48 min   Activity Tolerance Patient tolerated treatment well   Behavior During Therapy [facility] for tasks assessed/performed      Past Medical History:  Diagnosis Date  . Anxiety   . Complication of anesthesia    slow to wake up   . Depression   . Diabetes mellitus without complication (HCC)   . GERD (gastroesophageal reflux disease)   . Hypertension   . Overactive bladder   . PONV (postoperative nausea and vomiting)   . Sleep apnea    cpap     Past Surgical History:  Procedure Laterality Date  . CHOLECYSTECTOMY    . COLONOSCOPY    . exploratory vaginal surgery     . LAPAROSCOPIC GASTRIC SLEEVE RESECTION N/A 03/24/2015   Procedure: LAPAROSCOPIC GASTRIC SLEEVE RESECTION UPPER ENDOSCOPY;  Surgeon: Ovidio Kin, MD;  [facility];  Service: General;  Laterality: N/A;  . LAPAROSCOPY    . pilonidal cyst removal     . UPPER GI ENDOSCOPY  03/24/2015   Procedure: UPPER GI ENDOSCOPY;  Surgeon: Ovidio Kin, MD;  [facility];  Service: General;;    There were no vitals filed for this visit.      Subjective Assessment - 04/05/17 1407    Subjective I'm tired today and would like to skip exercise.   Pain Score 3    Pain Location Knee   Pain Orientation Left   Pain Descriptors / Indicators Aching;Sore   Pain Type Acute pain   Pain Onset More than a month ago                         [facility] Adult PT Treatment/Exercise - 04/05/17 0001      Modalities   Modalities Electrical Stimulation;Moist Heat      Moist Heat Therapy   Number Minutes Moist Heat 15 Minutes   Moist Heat Location --  Left posterior knee.     Electrical Stimulation   Electrical Stimulation Location Left posterior knee.   Electrical Stimulation Action IFC   Electrical Stimulation Parameters 80-150 Hz x 15 minutes.   Electrical Stimulation Goals Tone;Pain     Ultrasound   Ultrasound Location Left distal hams/upper calf musculature.   Ultrasound Parameters 1.50 W/CM2 x 12 minutes.   Ultrasound Goals Pain     Manual Therapy   Manual Therapy Soft tissue mobilization   Soft tissue mobilization In Prone:  STW/M x 12 minutes to patient's left posterior knee.                  PT Short Term Goals - 03/16/17 1425      PT SHORT TERM GOAL #1   Title pt will be independent with her initial HEP.    Time 3   Period Weeks   Status New   Target Date 04/06/17           PT Long Term Goals - 03/16/17 1422      PT LONG  TERM GOAL #1   Title Pt will improve her left LE knee flexion and extension strength to grossly 4+/5 in order to improve gait and functional mobility.    Time 8   Period Weeks   Status New   Target Date 05/11/17     PT LONG TERM GOAL #2   Title Pt will improve her gait to step through gait pattern with equalized step length and heel to toe gait pattern for > 1000 feet on community level surfaces.    Time 8   Period Weeks   Status New   Target Date 05/11/17     PT LONG TERM GOAL #3   Title Pt will report no pain with ADL's.    Time 8   Period Weeks   Status New   Target Date 05/11/17               Plan - 04/05/17 1454    Clinical Impression Statement Patient felt beter after treatment but has continued c/o her left knee locking.  She saw her doctor and is to be scheduled for an MRI.      Patient will benefit from skilled therapeutic intervention in order to improve the following deficits and impairments:     Visit Diagnosis: Acute pain of left knee  Difficulty in  walking, not elsewhere classified  Muscle weakness (generalized)     Problem List Patient Active Problem List   Diagnosis Date Noted  . Morbid obesity (HCC) 03/24/2015    Sheleen Conchas, [place] MPT 04/05/2017, 2:59 PM  [facility] [address] [place], [place], [address] Phone: [phone]   Fax:  [phone]  Name: Sheppard PlumberDeidra K Smither MRN: [id number] Date of Birth: 10/30/1975

## 2017-04-07 ENCOUNTER — Ambulatory Visit: Payer: Managed Care, Other (non HMO) | Admitting: Physical Therapy

## 2017-04-07 DIAGNOSIS — M25562 Pain in left knee: Secondary | ICD-10-CM | POA: Diagnosis not present

## 2017-04-07 DIAGNOSIS — M6281 Muscle weakness (generalized): Secondary | ICD-10-CM

## 2017-04-07 DIAGNOSIS — R262 Difficulty in walking, not elsewhere classified: Secondary | ICD-10-CM

## 2017-04-07 NOTE — Therapy (Signed)
[facility] [address] [place], [place], [address] Phone: [phone]   Fax:  [phone]  Physical Therapy Treatment  Patient Details  Name: Carla Rogers MRN: [id number] Date of Birth: 10-Dec-1975 Referring Provider: Dr. Jamelle Haring Daws  Encounter Date: 04/07/2017      PT End of Session - 04/07/17 0959    Visit Number 7   Number of Visits 16   Date for PT Re-Evaluation 05/11/17   PT Start Time 0900   PT Stop Time 1000   PT Time Calculation (min) 60 min   Activity Tolerance Patient tolerated treatment well   Behavior During Therapy [facility] for tasks assessed/performed      Past Medical History:  Diagnosis Date  . Anxiety   . Complication of anesthesia    slow to wake up   . Depression   . Diabetes mellitus without complication (HCC)   . GERD (gastroesophageal reflux disease)   . Hypertension   . Overactive bladder   . PONV (postoperative nausea and vomiting)   . Sleep apnea    cpap     Past Surgical History:  Procedure Laterality Date  . CHOLECYSTECTOMY    . COLONOSCOPY    . exploratory vaginal surgery     . LAPAROSCOPIC GASTRIC SLEEVE RESECTION N/A 03/24/2015   Procedure: LAPAROSCOPIC GASTRIC SLEEVE RESECTION UPPER ENDOSCOPY;  Surgeon: Ovidio Kin, MD;  [facility];  Service: General;  Laterality: N/A;  . LAPAROSCOPY    . pilonidal cyst removal     . UPPER GI ENDOSCOPY  03/24/2015   Procedure: UPPER GI ENDOSCOPY;  Surgeon: Ovidio Kin, MD;  [facility];  Service: General;;    There were no vitals filed for this visit.      Subjective Assessment - 04/07/17 1000    Subjective I'm much better since last treatment and I'm walking better.   Patient Stated Goals I want to be able to keep up with my 107month old and 2 other children.    Pain Score 2    Pain Location Knee   Pain Orientation Left   Pain Descriptors / Indicators Aching;Dull   Pain Type Acute pain   Pain Onset More than a month ago                          [facility] Adult PT Treatment/Exercise - 04/07/17 0001      Modalities   Modalities Electrical Stimulation;Ultrasound     Moist Heat Therapy   Number Minutes Moist Heat 20 Minutes   Moist Heat Location --  Left posterior knee.     Programme researcher, broadcasting/film/video Location Left post knee.   Electrical Stimulation Action IFC   Electrical Stimulation Parameters 80-150 Hz x 20 minutes.   Electrical Stimulation Goals Tone;Pain     Ultrasound   Ultrasound Location Left distal hams/upper calf.   Ultrasound Parameters 1.50 W/CM2 x 12 minutes.   Ultrasound Goals Pain     Manual Therapy   Manual Therapy Soft tissue mobilization   Soft tissue mobilization STW/M x 12 minutes.                  PT Short Term Goals - 03/16/17 1425      PT SHORT TERM GOAL #1   Title pt will be independent with her initial HEP.    Time 3   Period Weeks   Status New   Target Date 04/06/17  PT Long Term Goals - 03/16/17 1422      PT LONG TERM GOAL #1   Title Pt will improve her left LE knee flexion and extension strength to grossly 4+/5 in order to improve gait and functional mobility.    Time 8   Period Weeks   Status New   Target Date 05/11/17     PT LONG TERM GOAL #2   Title Pt will improve her gait to step through gait pattern with equalized step length and heel to toe gait pattern for > 1000 feet on community level surfaces.    Time 8   Period Weeks   Status New   Target Date 05/11/17     PT LONG TERM GOAL #3   Title Pt will report no pain with ADL's.    Time 8   Period Weeks   Status New   Target Date 05/11/17               Plan - 04/07/17 1004    Clinical Impression Statement Nearly pain-free after tretament today.  Less tone in left posteriorknee musculature today.      Patient will benefit from skilled therapeutic intervention in order to improve the following deficits and impairments:     Visit  Diagnosis: Acute pain of left knee  Difficulty in walking, not elsewhere classified  Muscle weakness (generalized)     Problem List Patient Active Problem List   Diagnosis Date Noted  . Morbid obesity (HCC) 03/24/2015    Doniven Vanpatten, [place] MPT 04/07/2017, 10:06 AM  [facility] [address] [place], [place], [address] Phone: [phone]   Fax:  [phone]  Name: Carla Rogers MRN: [id number] Date of Birth: 12/11/1975

## 2017-04-12 ENCOUNTER — Ambulatory Visit: Payer: Managed Care, Other (non HMO) | Admitting: Physical Therapy

## 2017-04-12 DIAGNOSIS — R262 Difficulty in walking, not elsewhere classified: Secondary | ICD-10-CM

## 2017-04-12 DIAGNOSIS — M6281 Muscle weakness (generalized): Secondary | ICD-10-CM

## 2017-04-12 DIAGNOSIS — M25562 Pain in left knee: Secondary | ICD-10-CM | POA: Diagnosis not present

## 2017-04-12 NOTE — Therapy (Signed)
[facility] [address] [place], [place], [address] Phone: [phone]   Fax:  [phone]  Physical Therapy Treatment  Patient Details  Name: Carla Rogers MRN: [id number] Date of Birth: 1976/01/07 Referring Provider: Dr. Jamelle Haring Daws  Encounter Date: 04/12/2017      PT End of Session - 04/12/17 0952    Visit Number 8   Number of Visits 16   Date for PT Re-Evaluation 05/11/17   PT Start Time 0952   PT Stop Time 1038   PT Time Calculation (min) 46 min      Past Medical History:  Diagnosis Date  . Anxiety   . Complication of anesthesia    slow to wake up   . Depression   . Diabetes mellitus without complication (HCC)   . GERD (gastroesophageal reflux disease)   . Hypertension   . Overactive bladder   . PONV (postoperative nausea and vomiting)   . Sleep apnea    cpap     Past Surgical History:  Procedure Laterality Date  . CHOLECYSTECTOMY    . COLONOSCOPY    . exploratory vaginal surgery     . LAPAROSCOPIC GASTRIC SLEEVE RESECTION N/A 03/24/2015   Procedure: LAPAROSCOPIC GASTRIC SLEEVE RESECTION UPPER ENDOSCOPY;  Surgeon: Ovidio Kin, MD;  Location: [facility];  Service: General;  Laterality: N/A;  . LAPAROSCOPY    . pilonidal cyst removal     . UPPER GI ENDOSCOPY  03/24/2015   Procedure: UPPER GI ENDOSCOPY;  Surgeon: Ovidio Kin, MD;  Location: [facility];  Service: General;;    There were no vitals filed for this visit.      Subjective Assessment - 04/12/17 0950    Subjective Not locking as bad in standing although still intermittant. Reports that she has only experienced locking with sitting only a few times in the last few days. Reports leaning over and feeling like the muscle was tearing.   Pertinent History genu recurvatum , DM   Limitations Walking;House hold activities   How long can you sit comfortably? unlimited   How long can you stand comfortably? 5 minutes   How long can you walk comfortably? 5 minutes    Patient Stated Goals I want to be able to keep up with my 69month old and 2 other children.    Currently in Pain? Yes   Pain Score 3    Pain Location Knee   Pain Orientation Left;Posterior;Proximal   Pain Type Acute pain   Pain Onset More than a month ago            [facility] PT Assessment - 04/12/17 0001      Assessment   Medical Diagnosis left hamstring injury   Onset Date/Surgical Date 01/28/17   Hand Dominance Right   Next MD Visit 04/04/17   Prior Therapy yes, shoulder, several years ago     Precautions   Precautions None   Required Braces or Orthoses Other Brace/Splint   Other Brace/Splint knee support     Observation/Other Assessments-Edema    Edema Circumferential     Circumferential Edema   Circumferential - Right 42 cm   Circumferential - Left  44 cm                     [facility] Adult PT Treatment/Exercise - 04/12/17 0001      Modalities   Modalities Electrical Stimulation;Moist Heat;Ultrasound     Moist Heat Therapy   Number Minutes Moist Heat 15 Minutes  Moist Heat Location Knee     Programme researcher, broadcasting/film/video Location L Programmer, applications Pre-Mod   Electrical Stimulation Parameters 80-150 hz x15 min   Electrical Stimulation Goals Tone;Pain     Ultrasound   Ultrasound Location L medial HS, posteriosuperior gastroc   Ultrasound Parameters 1.5 w/cm2, 100%, 1 mhz x10 min   Ultrasound Goals Pain     Manual Therapy   Manual Therapy Soft tissue mobilization   Soft tissue mobilization STW/TPR to L medial HS, plantaris to reduce tone and pain                  PT Short Term Goals - 03/16/17 1425      PT SHORT TERM GOAL #1   Title pt will be independent with her initial HEP.    Time 3   Period Weeks   Status New   Target Date 04/06/17           PT Long Term Goals - 03/16/17 1422      PT LONG TERM GOAL #1   Title Pt will improve her left LE knee flexion and extension  strength to grossly 4+/5 in order to improve gait and functional mobility.    Time 8   Period Weeks   Status New   Target Date 05/11/17     PT LONG TERM GOAL #2   Title Pt will improve her gait to step through gait pattern with equalized step length and heel to toe gait pattern for > 1000 feet on community level surfaces.    Time 8   Period Weeks   Status New   Target Date 05/11/17     PT LONG TERM GOAL #3   Title Pt will report no pain with ADL's.    Time 8   Period Weeks   Status New   Target Date 05/11/17               Plan - 04/12/17 1111    Clinical Impression Statement Patient continues to present with L knee pain although locking sensations have reduced but still presents. Patient very tender along L plantaris and still very tight along L medial HS and along Plantaris. 2 cm greater edema in L knee than R knee. Normal modalities response noted following removal of the modalities. Patient waiting at this time for MRI to be approved and scheduled.   Rehab Potential Good   PT Frequency 2x / week   PT Duration 8 weeks   PT Treatment/Interventions ADLs/Self Care Home Management;Electrical Stimulation;Iontophoresis /ml Dexamethasone;Ultrasound;Passive range of motion;Neuromuscular re-education;Balance training;Therapeutic exercise;Therapeutic activities;Functional mobility training;Gait training;Stair training;Patient/family education;Manual techniques;Dry needling;Taping   PT Next Visit Plan Continue per MD discretion.   PT Home Exercise Plan hamstring sets, hamstring stretch, knee to opposite shoulder, prone hamstring curls   Consulted and Agree with Plan of Care Patient      Patient will benefit from skilled therapeutic intervention in order to improve the following deficits and impairments:  Abnormal gait, Pain, Impaired flexibility  Visit Diagnosis: Acute pain of left knee  Difficulty in walking, not elsewhere classified  Muscle weakness  (generalized)     Problem List Patient Active Problem List   Diagnosis Date Noted  . Morbid obesity (HCC) 03/24/2015    Evelene Croon, PTA 04/12/2017, 11:16 AM  [facility] [address] [place], [place], [address] Phone: [phone]   Fax:  [phone]  Name: Carla Rogers MRN: [id number]  Date of Birth: 12/20/1975

## 2017-04-14 ENCOUNTER — Encounter: Payer: Managed Care, Other (non HMO) | Admitting: Physical Therapy

## 2017-04-19 ENCOUNTER — Encounter: Payer: Managed Care, Other (non HMO) | Admitting: Physical Therapy

## 2017-05-01 ENCOUNTER — Encounter: Payer: Self-pay | Admitting: Physical Therapy

## 2017-05-01 ENCOUNTER — Ambulatory Visit: Payer: Managed Care, Other (non HMO) | Attending: Orthopedic Surgery | Admitting: Physical Therapy

## 2017-05-01 DIAGNOSIS — R262 Difficulty in walking, not elsewhere classified: Secondary | ICD-10-CM | POA: Diagnosis present

## 2017-05-01 DIAGNOSIS — M6281 Muscle weakness (generalized): Secondary | ICD-10-CM | POA: Diagnosis present

## 2017-05-01 DIAGNOSIS — M25562 Pain in left knee: Secondary | ICD-10-CM | POA: Diagnosis not present

## 2017-05-01 NOTE — Patient Instructions (Addendum)
Strengthening: Straight Leg Raise (Phase 1)    Tighten muscles on front of left thigh, then lift leg __6__ inches from surface, keeping knee locked.  Repeat _10___ times per set. Do __2__ sets per session. Do _2-3___ sessions per day.  http://orth.exer.us/614   Copyright  VHI. All rights reserved.  Straight Leg Raise: With External Leg Rotation    Lie on back with left leg straight, opposite leg bent. Rotate straight leg out and lift _6___ inches. Repeat _10___ times per set. Do _2___ sets per session. Do __2-3__ sessions per day.  http://orth.exer.us/728   Copyright  VHI. All rights reserved.  Terminal Knee Extension (Standing)    Facing anchor with left knee slightly bent and tubing just above knee, gently pull knee back straight. Do not overextend knee. Hold for 5 seconds each. Repeat _10___ times per set. Do __2__ sets per session. Do _2-3___ sessions per day.  http://orth.exer.us/666   Copyright  VHI. All rights reserved.

## 2017-05-01 NOTE — Therapy (Signed)
[facility] [address] [place], [place], [address] Phone: [phone]   Fax:  [phone]  Physical Therapy Treatment  Patient Details  Name: Carla Rogers MRN: [id number] Date of Birth: 1976-06-04 Referring Provider: Dr. Jamelle Haring Daws  Encounter Date: 05/01/2017      PT End of Session - 05/01/17 1434    Visit Number 9   Number of Visits 16   Date for PT Re-Evaluation 05/11/17   PT Start Time 1431   PT Stop Time 1524   PT Time Calculation (min) 53 min   Activity Tolerance Patient tolerated treatment well   Behavior During Therapy [facility] for tasks assessed/performed      Past Medical History:  Diagnosis Date  . Anxiety   . Complication of anesthesia    slow to wake up   . Depression   . Diabetes mellitus without complication (HCC)   . GERD (gastroesophageal reflux disease)   . Hypertension   . Overactive bladder   . PONV (postoperative nausea and vomiting)   . Sleep apnea    cpap     Past Surgical History:  Procedure Laterality Date  . CHOLECYSTECTOMY    . COLONOSCOPY    . exploratory vaginal surgery     . LAPAROSCOPIC GASTRIC SLEEVE RESECTION N/A 03/24/2015   Procedure: LAPAROSCOPIC GASTRIC SLEEVE RESECTION UPPER ENDOSCOPY;  Surgeon: Ovidio Kin, MD;  Location: [facility];  Service: General;  Laterality: N/A;  . LAPAROSCOPY    . pilonidal cyst removal     . UPPER GI ENDOSCOPY  03/24/2015   Procedure: UPPER GI ENDOSCOPY;  Surgeon: Ovidio Kin, MD;  Location: [facility];  Service: General;;    There were no vitals filed for this visit.      Subjective Assessment - 05/01/17 1428    Subjective Reports that she recieved an injection which worked for a few days but is now feeling as if it will lock again and she has had the injection less than a week ago. Locking more with standing after sitting. Reports that she is kind of defeated as she needs to be able to get up and run after her small son.   Pertinent History genu recurvatum ,  DM   Limitations Walking;House hold activities   How long can you sit comfortably? unlimited   How long can you stand comfortably? 5 minutes   How long can you walk comfortably? 5 minutes   Patient Stated Goals I want to be able to keep up with my 62month old and 2 other children.    Currently in Pain? Yes   Pain Score 4    Pain Location Knee   Pain Orientation Left;Anterior   Pain Descriptors / Indicators Discomfort   Pain Type Acute pain   Pain Onset More than a month ago            [facility] PT Assessment - 05/01/17 0001      Assessment   Medical Diagnosis left hamstring injury   Onset Date/Surgical Date 01/28/17   Hand Dominance Right   Next MD Visit 06/02/2017   Prior Therapy yes, shoulder, several years ago     Precautions   Precautions None   Required Braces or Orthoses Other Brace/Splint   Other Brace/Splint knee support                     [facility] Adult PT Treatment/Exercise - 05/01/17 0001      Knee/Hip Exercises: Aerobic   Nustep L4 x10  min     Knee/Hip Exercises: Standing   Terminal Knee Extension Limitations LLE red theraband x20 reps 5 sec hold     Knee/Hip Exercises: Seated   Long Arc Quad Strengthening;Left;1 set;15 reps;Weights   Long Arc Quad Weight 3 lbs.   Long [employer] Limitations reported as "uncomfortable"     Knee/Hip Exercises: Supine   Bridges Limitations 2x10 reps   Straight Leg Raises AROM;Left;2 sets;10 reps   Straight Leg Raise with External Rotation AROM;Left;2 sets;10 reps     Knee/Hip Exercises: Sidelying   Hip ABduction AROM;Left;2 sets;10 reps     Modalities   Modalities Proofreader Location L knee   Electrical Stimulation Action IFC   Electrical Stimulation Parameters 1-10 hz x15 min   Electrical Stimulation Goals Edema;Pain     Vasopneumatic   Number Minutes Vasopneumatic  15 minutes   Vasopnuematic Location  Knee   Vasopneumatic  Pressure Medium   Vasopneumatic Temperature  34                PT Education - 05/01/17 1515    Education provided Yes   Education Details HEP- SLR, SLR with ER, TKE with red theraband   Person(s) Educated Patient   Methods Explanation;Handout   Comprehension Verbalized understanding          PT Short Term Goals - 03/16/17 1425      PT SHORT TERM GOAL #1   Title pt will be independent with her initial HEP.    Time 3   Period Weeks   Status New   Target Date 04/06/17           PT Long Term Goals - 03/16/17 1422      PT LONG TERM GOAL #1   Title Pt will improve her left LE knee flexion and extension strength to grossly 4+/5 in order to improve gait and functional mobility.    Time 8   Period Weeks   Status New   Target Date 05/11/17     PT LONG TERM GOAL #2   Title Pt will improve her gait to step through gait pattern with equalized step length and heel to toe gait pattern for > 1000 feet on community level surfaces.    Time 8   Period Weeks   Status New   Target Date 05/11/17     PT LONG TERM GOAL #3   Title Pt will report no pain with ADL's.    Time 8   Period Weeks   Status New   Target Date 05/11/17               Plan - 05/01/17 1516    Clinical Impression Statement Patient continues to present with L knee locking even following an injection 04/25/2017. Patient encouraged to continue PT to improve L quad strength and reassess symptoms for follow up with MD. Patient experienced uncomfortable sensation with LAQ thus limited with exercise. Patient had no other complaint with exercises other than fatigue. Patient provided hew HEP with resistance band to progress strengthening at home. Patient verbalized understanding of HEP education. Normal modalities response noted following removal of the modalities.   Rehab Potential Good   PT Frequency 2x / week   PT Duration 8 weeks   PT Treatment/Interventions ADLs/Self Care Home Management;Electrical  Stimulation;Iontophoresis /ml Dexamethasone;Ultrasound;Passive range of motion;Neuromuscular re-education;Balance training;Therapeutic exercise;Therapeutic activities;Functional mobility training;Gait training;Stair training;Patient/family education;Manual techniques;Dry needling;Taping   PT Next Visit Plan  Continue with painfree L quad/knee strengthening per MPT POC.   PT Home Exercise Plan hamstring sets, hamstring stretch, knee to opposite shoulder, prone hamstring curls   Consulted and Agree with Plan of Care Patient      Patient will benefit from skilled therapeutic intervention in order to improve the following deficits and impairments:  Abnormal gait, Pain, Impaired flexibility  Visit Diagnosis: Acute pain of left knee  Difficulty in walking, not elsewhere classified  Muscle weakness (generalized)     Problem List Patient Active Problem List   Diagnosis Date Noted  . Morbid obesity (HCC) 03/24/2015    Evelene Croon, PTA 05/01/2017, 3:37 PM  [facility] [address] [place], [place], [address] Phone: [phone]   Fax:  [phone]  Name: Carla Rogers MRN: [id number] Date of Birth: August 30, 1975

## 2017-05-03 ENCOUNTER — Encounter: Payer: Self-pay | Admitting: Physical Therapy

## 2017-05-03 ENCOUNTER — Ambulatory Visit: Payer: Managed Care, Other (non HMO) | Admitting: Physical Therapy

## 2017-05-03 DIAGNOSIS — M25562 Pain in left knee: Secondary | ICD-10-CM | POA: Diagnosis not present

## 2017-05-03 DIAGNOSIS — M6281 Muscle weakness (generalized): Secondary | ICD-10-CM

## 2017-05-03 DIAGNOSIS — R262 Difficulty in walking, not elsewhere classified: Secondary | ICD-10-CM

## 2017-05-03 NOTE — Therapy (Signed)
[facility] [place], [place], [address] Phone: [phone]   Fax:  [phone]  Physical Therapy Treatment  Patient Details  Name: Carla Rogers MRN: [id number] Date of Birth: 27-Feb-1976 Referring Provider: Dr. Emogene Morgan Daws  Encounter Date: 05/03/2017      PT End of Session - 05/03/17 1449    Visit Number 10   Number of Visits 32   Date for PT Re-Evaluation 05/23/17   PT Start Time 3546   PT Stop Time 1500   PT Time Calculation (min) 43 min   Activity Tolerance Patient tolerated treatment well   Behavior During Therapy [facility] for tasks assessed/performed      Past Medical History:  Diagnosis Date  . Anxiety   . Complication of anesthesia    slow to wake up   . Depression   . Diabetes mellitus without complication ([place])   . GERD (gastroesophageal reflux disease)   . Hypertension   . Overactive bladder   . PONV (postoperative nausea and vomiting)   . Sleep apnea    cpap     Past Surgical History:  Procedure Laterality Date  . CHOLECYSTECTOMY    . COLONOSCOPY    . exploratory vaginal surgery     . LAPAROSCOPIC GASTRIC SLEEVE RESECTION N/A 03/24/2015   Procedure: LAPAROSCOPIC GASTRIC SLEEVE RESECTION UPPER ENDOSCOPY;  Surgeon: Alphonsa Overall, MD;  Location: [facility];  Service: General;  Laterality: N/A;  . LAPAROSCOPY    . pilonidal cyst removal     . UPPER GI ENDOSCOPY  03/24/2015   Procedure: UPPER GI ENDOSCOPY;  Surgeon: Alphonsa Overall, MD;  Location: [facility];  Service: General;;    There were no vitals filed for this visit.      Subjective Assessment - 05/03/17 1422    Subjective Patient reported a relative accidently hit her knee which caused a slight hyper ext in knee that increased pain and had to wear a brace today   Pertinent History genu recurvatum , DM   Limitations Walking;House hold activities   How long can you sit comfortably? unlimited   How long can you stand comfortably? 5 minutes   How long can  you walk comfortably? 5 minutes   Patient Stated Goals I want to be able to keep up with my 27monthold and 2 other children.    Currently in Pain? Yes   Pain Score 4    Pain Location Knee   Pain Orientation Left   Pain Descriptors / Indicators Discomfort   Pain Type Acute pain   Pain Onset More than a month ago   Pain Frequency Intermittent   Aggravating Factors  increased activity   Pain Relieving Factors rest and ice                         [facility] Adult PT Treatment/Exercise - 05/03/17 0001      Knee/Hip Exercises: Aerobic   Nustep L4 x10 min UE/LE     Electrical Stimulation   Electrical Stimulation Location L knee   Electrical Stimulation Action IFC   Electrical Stimulation Parameters 1-_0  x159m   Electrical Stimulation Goals Edema;Pain     Vasopneumatic   Number Minutes Vasopneumatic  15 minutes   Vasopnuematic Location  Knee   Vasopneumatic Pressure Medium     Manual Therapy   Manual Therapy Soft tissue mobilization   Soft tissue mobilization STW/TPR to L knee inf and post knee cap are of pain /quad muscle  to reduce tone and pain                  PT Short Term Goals - 05/03/17 1450      PT SHORT TERM GOAL #1   Title pt will be independent with her initial HEP.    Time 3   Period Weeks   Status Achieved           PT Long Term Goals - 05/03/17 1450      PT LONG TERM GOAL #1   Title Pt will improve her left LE knee flexion and extension strength to grossly 4+/5 in order to improve gait and functional mobility.    Time 8   Period Weeks     PT LONG TERM GOAL #2   Title Pt will improve her gait to step through gait pattern with equalized step length and heel to toe gait pattern for > 1000 feet on community level surfaces.    Time 8   Period Weeks   Status On-going     PT LONG TERM GOAL #3   Title Pt will report no pain with ADL's.    Time 8   Period Weeks   Status On-going               Plan - 05/03/17 1451     Clinical Impression Statement Patient tolerated treatment well today. Patient reported feeling better after her injection and felt so good she stood for prolong time and played some basketball with kids which cased a tweak in knee and some discomfort then yesterday was hit in knee  which caused more discofort. Today  focused on manual STW to knee to reduce pain. Patient reported doing HEP as able and understands importance of pain free strengthening currently. STG #1 met and all LTG's ongoing due to pain deficts.    Rehab Potential Good   PT Frequency 2x / week   PT Duration 8 weeks   PT Treatment/Interventions ADLs/Self Care Home Management;Electrical Stimulation;Iontophoresis 67m/ml Dexamethasone;Ultrasound;Passive range of motion;Neuromuscular re-education;Balance training;Therapeutic exercise;Therapeutic activities;Functional mobility training;Gait training;Stair training;Patient/family education;Manual techniques;Dry needling;Taping   PT Next Visit Plan Continue with painfree L quad/knee strengthening per MPT POC.   Consulted and Agree with Plan of Care Patient      Patient will benefit from skilled therapeutic intervention in order to improve the following deficits and impairments:  Abnormal gait, Pain, Impaired flexibility  Visit Diagnosis: Acute pain of left knee  Difficulty in walking, not elsewhere classified  Muscle weakness (generalized)     Problem List Patient Active Problem List   Diagnosis Date Noted  . [facility]    Timya Trimmer P, PTA 05/03/2017, 3:00 PM  [facility] [address]Phone: [phone]  Fax:  [phone] Name: Carla LORMANMRN: [id number] of Birth: [birth year]

## 2017-05-08 ENCOUNTER — Encounter: Payer: Self-pay | Admitting: Physical Therapy

## 2017-05-08 ENCOUNTER — Ambulatory Visit: Payer: Managed Care, Other (non HMO) | Admitting: Physical Therapy

## 2017-05-08 DIAGNOSIS — M6281 Muscle weakness (generalized): Secondary | ICD-10-CM

## 2017-05-08 DIAGNOSIS — R262 Difficulty in walking, not elsewhere classified: Secondary | ICD-10-CM

## 2017-05-08 DIAGNOSIS — M25562 Pain in left knee: Secondary | ICD-10-CM | POA: Diagnosis not present

## 2017-05-08 NOTE — Therapy (Signed)
[facility] [address] [place], [place], [address] Phone: [phone]   Fax:  [phone]  Physical Therapy Treatment  Patient Details  Name: Carla Rogers MRN: [id number] Date of Birth: 09-03-1975 Referring Provider: Dr. Jamelle Haring Daws  Encounter Date: 05/08/2017      PT End of Session - 05/08/17 1032    Visit Number 11   Number of Visits 32   Date for PT Re-Evaluation 05/23/17   PT Start Time 1036   PT Stop Time 1121   PT Time Calculation (min) 45 min   Activity Tolerance Patient tolerated treatment well   Behavior During Therapy [facility] for tasks assessed/performed      Past Medical History:  Diagnosis Date  . Anxiety   . Complication of anesthesia    slow to wake up   . Depression   . Diabetes mellitus without complication (HCC)   . GERD (gastroesophageal reflux disease)   . Hypertension   . Overactive bladder   . PONV (postoperative nausea and vomiting)   . Sleep apnea    cpap     Past Surgical History:  Procedure Laterality Date  . CHOLECYSTECTOMY    . COLONOSCOPY    . exploratory vaginal surgery     . LAPAROSCOPIC GASTRIC SLEEVE RESECTION N/A 03/24/2015   Procedure: LAPAROSCOPIC GASTRIC SLEEVE RESECTION UPPER ENDOSCOPY;  Surgeon: Ovidio Kin, MD;  Location: [facility];  Service: General;  Laterality: N/A;  . LAPAROSCOPY    . pilonidal cyst removal     . UPPER GI ENDOSCOPY  03/24/2015   Procedure: UPPER GI ENDOSCOPY;  Surgeon: Ovidio Kin, MD;  Location: [facility];  Service: General;;    There were no vitals filed for this visit.      Subjective Assessment - 05/08/17 1032    Subjective Reports that she was active with her kids this weekend and reports that she feels like it may lock up. Reports that her knee has locked up but it only last a few seconds.   Pertinent History genu recurvatum , DM   Limitations Walking;House hold activities   How long can you sit comfortably? unlimited   How long can you stand  comfortably? 5 minutes   How long can you walk comfortably? 5 minutes   Patient Stated Goals I want to be able to keep up with my 67month old and 2 other children.    Currently in Pain? Yes   Pain Score 4    Pain Location Shoulder   Pain Orientation Left   Pain Descriptors / Indicators Discomfort   Pain Type Acute pain   Pain Onset More than a month ago            [facility] PT Assessment - 05/08/17 0001      Assessment   Medical Diagnosis left hamstring injury   Onset Date/Surgical Date 01/28/17   Hand Dominance Right   Next MD Visit 06/02/2017   Prior Therapy yes, shoulder, several years ago     Precautions   Precautions None   Required Braces or Orthoses Other Brace/Splint   Other Brace/Splint knee support                     [facility] Adult PT Treatment/Exercise - 05/08/17 0001      Knee/Hip Exercises: Stretches   Passive Hamstring Stretch Left;3 reps;30 seconds   Soleus Stretch Left;3 reps;20 seconds   Other Knee/Hip Stretches L ITB stretch 3x30 sec     Knee/Hip Exercises: Aerobic  Nustep L5 x10 min     Knee/Hip Exercises: Standing   Rocker Board 5 minutes     Knee/Hip Exercises: Supine   Straight Leg Raises AROM;Left;2 sets;10 repProofreaderlities Electrical Stimulation;Vasopneumatic     Electrical Stimulation   Electrical Stimulation Location L knee   Electrical Stimulation Action IFC   Electrical Stimulation Parameters 1-10 hz x15 min   Electrical Stimulation Goals Edema;Pain     Vasopneumatic   Number Minutes Vasopneumatic  15 minutes   Vasopnuematic Location  Knee   Vasopneumatic Pressure Medium   Vasopneumatic Temperature  34                  PT Short Term Goals - 05/03/17 1450      PT SHORT TERM GOAL #1   Title pt will be independent with her initial HEP.    Time 3   Period Weeks   Status Achieved           PT Long Term Goals - 05/08/17 1040       PT LONG TERM GOAL #1   Title Pt will improve her left LE knee flexion and extension strength to grossly 4+/5 in order to improve gait and functional mobility.    Time 8   Period Weeks   Status On-going     PT LONG TERM GOAL #2   Title Pt will improve her gait to step through gait pattern with equalized step length and heel to toe gait pattern for > 1000 feet on community level surfaces.    Time 8   Period Weeks   Status On-going     PT LONG TERM GOAL #3   Title Pt will report no pain with ADL's.    Time 8   Period Weeks   Status On-going               Plan - 05/08/17 1155    Clinical Impression Statement Patient tolerated today's treatment fairly well although she arrived with increased discomfort from a busy weekend and being on her feet for prolonged periods. Patient guided through calf stretches with greatest discomfort noted with soleus stretch. No complaints with SLR and L hip discomfort noted with SLR with ER. Overpressure required for HS and ITB stretches today. Patient provided new HEP for stretching with patient verbalizing understanding and educated to stop stretch if increased pain encountered later. Normal modalities response noted following removal of the modalities.    Rehab Potential Good   PT Frequency 2x / week   PT Duration 8 weeks   PT Treatment/Interventions ADLs/Self Care Home Management;Electrical Stimulation;Iontophoresis /ml Dexamethasone;Ultrasound;Passive range of motion;Neuromuscular re-education;Balance training;Therapeutic exercise;Therapeutic activities;Functional mobility training;Gait training;Stair training;Patient/family education;Manual techniques;Dry needling;Taping   PT Next Visit Plan Continue with painfree L quad/knee strengthening per MPT POC.   PT Home Exercise Plan hamstring sets, hamstring stretch, knee to opposite shoulder, prone hamstring curls   Consulted and Agree with Plan of Care Patient      Patient will benefit from  skilled therapeutic intervention in order to improve the following deficits and impairments:  Abnormal gait, Pain, Impaired flexibility  Visit Diagnosis: Acute pain of left knee  Difficulty in walking, not elsewhere classified  Muscle weakness (generalized)     Problem List Patient Active Problem List   Diagnosis Date Noted  . Morbid obesity (HCC) 03/24/2015    Evelene Croon, PTA 05/08/2017, 12:05 PM  [facility] [address] [place], [place], [address] Phone: [phone]   Fax:  [phone]  Name: Carla Rogers MRN: [id number] Date of Birth: May 07, 1976

## 2017-05-08 NOTE — Patient Instructions (Addendum)
Gastroc Stretch    Stand with left foot back, leg straight, forward leg bent. Keeping heel on floor, turned slightly out, lean into wall until stretch is felt in calf. Hold _30___ seconds. Repeat __3__ times per set. Do ____ sets per session. Do __2-3__ sessions per day.  http://orth.exer.us/26   Copyright  VHI. All rights reserved.  Soleus Stretch    Stand with left foot back, both knees bent. Keeping heel on floor, turned slightly out, lean into wall until stretch is felt in lower calf. Hold __30__ seconds. Repeat _3___ times per set. Do ____ sets per session. Do _2-3___ sessions per day.  http://orth.exer.us/24   Copyright  VHI. All rights reserved.  Stretching: Hamstring (Supine)    Supporting left thigh behind knee or use leash, slowly straighten knee until stretch is felt in back of thigh. Hold __30__ seconds. Repeat ___3_ times per set. Do ____ sets per session. Do __2-3__ sessions per day.   Also complete similar stretch with left leg as your pull it across your body to the right. Hold for 30 seconds and complete 3 times.  http://orth.exer.us/656   Copyright  VHI. All rights reserved.

## 2017-05-10 ENCOUNTER — Ambulatory Visit: Payer: Managed Care, Other (non HMO) | Admitting: Physical Therapy

## 2017-05-10 ENCOUNTER — Encounter: Payer: Self-pay | Admitting: Physical Therapy

## 2017-05-10 DIAGNOSIS — M6281 Muscle weakness (generalized): Secondary | ICD-10-CM

## 2017-05-10 DIAGNOSIS — R262 Difficulty in walking, not elsewhere classified: Secondary | ICD-10-CM

## 2017-05-10 DIAGNOSIS — M25562 Pain in left knee: Secondary | ICD-10-CM | POA: Diagnosis not present

## 2017-05-10 NOTE — Therapy (Signed)
[facility] [address] [place], [place], [address] Phone: [phone]   Fax:  [phone]  Physical Therapy Treatment  Patient Details  Name: Carla Rogers MRN: [id number] Date of Birth: 04-18-1976 Referring Provider: Dr. Jamelle Haring Daws  Encounter Date: 05/10/2017      PT End of Session - 05/10/17 0956    Visit Number 12   Number of Visits 32   Date for PT Re-Evaluation 05/23/17   PT Start Time 0906   PT Stop Time 1001   PT Time Calculation (min) 55 min   Activity Tolerance Patient tolerated treatment well   Behavior During Therapy [facility] for tasks assessed/performed      Past Medical History:  Diagnosis Date  . Anxiety   . Complication of anesthesia    slow to wake up   . Depression   . Diabetes mellitus without complication (HCC)   . GERD (gastroesophageal reflux disease)   . Hypertension   . Overactive bladder   . PONV (postoperative nausea and vomiting)   . Sleep apnea    cpap     Past Surgical History:  Procedure Laterality Date  . CHOLECYSTECTOMY    . COLONOSCOPY    . exploratory vaginal surgery     . LAPAROSCOPIC GASTRIC SLEEVE RESECTION N/A 03/24/2015   Procedure: LAPAROSCOPIC GASTRIC SLEEVE RESECTION UPPER ENDOSCOPY;  Surgeon: Ovidio Kin, MD;  Location: [facility];  Service: General;  Laterality: N/A;  . LAPAROSCOPY    . pilonidal cyst removal     . UPPER GI ENDOSCOPY  03/24/2015   Procedure: UPPER GI ENDOSCOPY;  Surgeon: Ovidio Kin, MD;  Location: [facility];  Service: General;;    There were no vitals filed for this visit.      Subjective Assessment - 05/10/17 0915    Subjective Reports that she locked yesterday for approximately 4 seconds.   Pertinent History genu recurvatum , DM   Limitations Walking;House hold activities   How long can you sit comfortably? unlimited   How long can you stand comfortably? 5 minutes   How long can you walk comfortably? 5 minutes   Patient Stated Goals I want to be able to  keep up with my 34month old and 2 other children.    Currently in Pain? Yes   Pain Score 3    Pain Location Knee   Pain Orientation Left   Pain Descriptors / Indicators Discomfort   Pain Type Acute pain   Pain Onset More than a month ago            [facility] PT Assessment - 05/10/17 0001      Assessment   Medical Diagnosis left hamstring injury   Onset Date/Surgical Date 01/28/17   Hand Dominance Right   Next MD Visit 06/02/2017   Prior Therapy yes, shoulder, several years ago     Precautions   Precautions None   Required Braces or Orthoses Other Brace/Splint   Other Brace/Splint knee support                     [facility] Adult PT Treatment/Exercise - 05/10/17 0001      Knee/Hip Exercises: Aerobic   Stationary Bike x5 min   Nustep L5 x5 min     Knee/Hip Exercises: Standing   Terminal Knee Extension Limitations LLE red theraband x20 reps 5 sec hold   Rocker Board 4 minutes     Knee/Hip Exercises: Supine   Short Arc Quad Sets AROM;Left;2 sets;10 reps  Straight Leg Raises AROM;Left;15 reps   Straight Leg Raise with External Rotation AROM;Left;15 reps     Modalities   Modalities Proofreader Location L lateral/posterior knee   Statistician Action IFC   Electrical Stimulation Parameters 1-10 hz x15 min   Electrical Stimulation Goals Edema;Pain     Vasopneumatic   Number Minutes Vasopneumatic  15 minutes   Vasopnuematic Location  Knee   Vasopneumatic Pressure Medium   Vasopneumatic Temperature  34                  PT Short Term Goals - 05/03/17 1450      PT SHORT TERM GOAL #1   Title pt will be independent with her initial HEP.    Time 3   Period Weeks   Status Achieved           PT Long Term Goals - 05/08/17 1040      PT LONG TERM GOAL #1   Title Pt will improve her left LE knee flexion and extension strength to grossly 4+/5 in order to improve  gait and functional mobility.    Time 8   Period Weeks   Status On-going     PT LONG TERM GOAL #2   Title Pt will improve her gait to step through gait pattern with equalized step length and heel to toe gait pattern for > 1000 feet on community level surfaces.    Time 8   Period Weeks   Status On-going     PT LONG TERM GOAL #3   Title Pt will report no pain with ADL's.    Time 8   Period Weeks   Status On-going               Plan - 05/10/17 1026    Clinical Impression Statement Patient tolerated today's treatment fairly well as she still has locking sensation in L knee. LLE strength improving and decreased pain with exercises noted and reported by patient. No tenderness palpated along lateral or posterior L knee. Minimal soreness with L patellar mobility today upon assessment. Patient denies any pain with HS stretch. Normal modalities response noted following removal of the modalities.   Rehab Potential Good   PT Frequency 2x / week   PT Duration 8 weeks   PT Treatment/Interventions ADLs/Self Care Home Management;Electrical Stimulation;Ultrasound;Passive range of motion;Neuromuscular re-education;Balance training;Therapeutic exercise;Therapeutic activities;Functional mobility training;Gait training;Stair training;Patient/family education;Manual techniques;Dry needling;Taping  Ionto removed today after discussion with Narda Amber, MPT due to sulfa drug allergy   PT Next Visit Plan Continue with painfree L quad/knee strengthening per MPT POC.   PT Home Exercise Plan hamstring sets, hamstring stretch, knee to opposite shoulder, prone hamstring curls   Consulted and Agree with Plan of Care Patient      Patient will benefit from skilled therapeutic intervention in order to improve the following deficits and impairments:  Abnormal gait, Pain, Impaired flexibility  Visit Diagnosis: Acute pain of left knee  Difficulty in walking, not elsewhere classified  Muscle weakness  (generalized)     Problem List Patient Active Problem List   Diagnosis Date Noted  . Morbid obesity (HCC) 03/24/2015    Evelene Croon, PTA 05/10/2017, 10:31 AM  [facility] [address] [place], [place], [address] Phone: [phone]   Fax:  [phone]  Name: Carla Rogers MRN: [id number] Date of Birth: July 03, 1976

## 2017-05-15 ENCOUNTER — Encounter: Payer: Self-pay | Admitting: Physical Therapy

## 2017-05-15 ENCOUNTER — Ambulatory Visit: Payer: Managed Care, Other (non HMO) | Admitting: Physical Therapy

## 2017-05-15 DIAGNOSIS — M6281 Muscle weakness (generalized): Secondary | ICD-10-CM

## 2017-05-15 DIAGNOSIS — M25562 Pain in left knee: Secondary | ICD-10-CM

## 2017-05-15 DIAGNOSIS — R262 Difficulty in walking, not elsewhere classified: Secondary | ICD-10-CM

## 2017-05-15 NOTE — Therapy (Signed)
[facility] [address] [place], [place], [address] Phone: [phone]   Fax:  [phone]  Physical Therapy Treatment  Patient Details  Name: Carla Rogers MRN: [id number] Date of Birth: March 07, 1976 Referring Provider: Dr. Jamelle Haring Daws  Encounter Date: 05/15/2017      PT End of Session - 05/15/17 0917    Visit Number 13   Number of Visits 32   Date for PT Re-Evaluation 05/23/17   PT Start Time 0905   PT Stop Time 0958   PT Time Calculation (min) 53 min   Activity Tolerance Patient tolerated treatment well   Behavior During Therapy [facility] for tasks assessed/performed      Past Medical History:  Diagnosis Date  . Anxiety   . Complication of anesthesia    slow to wake up   . Depression   . Diabetes mellitus without complication (HCC)   . GERD (gastroesophageal reflux disease)   . Hypertension   . Overactive bladder   . PONV (postoperative nausea and vomiting)   . Sleep apnea    cpap     Past Surgical History:  Procedure Laterality Date  . CHOLECYSTECTOMY    . COLONOSCOPY    . exploratory vaginal surgery     . LAPAROSCOPIC GASTRIC SLEEVE RESECTION N/A 03/24/2015   Procedure: LAPAROSCOPIC GASTRIC SLEEVE RESECTION UPPER ENDOSCOPY;  Surgeon: Ovidio Kin, MD;  Location: [facility];  Service: General;  Laterality: N/A;  . LAPAROSCOPY    . pilonidal cyst removal     . UPPER GI ENDOSCOPY  03/24/2015   Procedure: UPPER GI ENDOSCOPY;  Surgeon: Ovidio Kin, MD;  Location: [facility];  Service: General;;    There were no vitals filed for this visit.      Subjective Assessment - 05/15/17 0911    Subjective Reports that her knee is locking up and can loosen up her knee in standing with hip flexion and knee flexion. Reports that her knee has been hurting since last Thursday. Reports that her knee goes stiff while walking.   Pertinent History genu recurvatum , DM   Limitations Walking;House hold activities   How long can you sit comfortably?  unlimited   How long can you stand comfortably? 5 minutes   How long can you walk comfortably? 5 minutes   Patient Stated Goals I want to be able to keep up with my 20month old and 2 other children.    Currently in Pain? Yes   Pain Location Knee   Pain Orientation Left   Pain Descriptors / Indicators Discomfort   Pain Type Acute pain   Pain Onset More than a month ago            [facility] PT Assessment - 05/15/17 0001      Assessment   Medical Diagnosis left hamstring injury   Onset Date/Surgical Date 01/28/17   Hand Dominance Right   Next MD Visit 06/02/2017   Prior Therapy yes, shoulder, several years ago     Precautions   Precautions None   Required Braces or Orthoses Other Brace/Splint   Other Brace/Splint knee support                     [facility] Adult PT Treatment/Exercise - 05/15/17 0001      Knee/Hip Exercises: Stretches   Soleus Stretch Left;3 reps;30 seconds     Knee/Hip Exercises: Aerobic   Stationary Bike L2 x10 min     Knee/Hip Exercises: Machines for Strengthening   Total  Gym Leg Press 2 pl, seat 6 x20 reps     Knee/Hip Exercises: Standing   Terminal Knee Extension Limitations LLE green theraband x20 reps 5 sec hold   Rocker Board 4 minutes     Knee/Hip Exercises: Supine   Short Arc Quad Sets Strengthening;Left;3 sets;10 reps   Short Arc Quad Sets Limitations 3#   Straight Leg Raises AROM;Left;2 sets;10 reps   Straight Leg Raise with External Rotation AROM;Left;2 sets;10 reps     Modalities   Modalities Proofreader Location L lateral/posterior knee   Engineer, petroleum Parameters 1-10 hz x15 min   Electrical Stimulation Goals Edema;Pain     Vasopneumatic   Number Minutes Vasopneumatic  15 minutes   Vasopnuematic Location  Knee   Vasopneumatic Pressure Medium   Vasopneumatic Temperature  34                   PT Short Term Goals - 05/03/17 1450      PT SHORT TERM GOAL #1   Title pt will be independent with her initial HEP.    Time 3   Period Weeks   Status Achieved           PT Long Term Goals - 05/08/17 1040      PT LONG TERM GOAL #1   Title Pt will improve her left LE knee flexion and extension strength to grossly 4+/5 in order to improve gait and functional mobility.    Time 8   Period Weeks   Status On-going     PT LONG TERM GOAL #2   Title Pt will improve her gait to step through gait pattern with equalized step length and heel to toe gait pattern for > 1000 feet on community level surfaces.    Time 8   Period Weeks   Status On-going     PT LONG TERM GOAL #3   Title Pt will report no pain with ADL's.    Time 8   Period Weeks   Status On-going               Plan - 05/15/17 1014    Clinical Impression Statement Patient continues to present in clinic with reports of L knee pain and locking. Patient able to tolerate new exercises or increased resistance without increased reports of pain. Patient did have to mobilize L knee with hip and knee flexion intermittantly during exercises secondary to stiffness. Patient now beginning to experience pain along the superior aspect of calf region again. Normal modalities response noted following removal of the modalities. Patient reports compliance with HEP.   Rehab Potential Good   PT Frequency 2x / week   PT Duration 8 weeks   PT Treatment/Interventions ADLs/Self Care Home Management;Electrical Stimulation;Ultrasound;Passive range of motion;Neuromuscular re-education;Balance training;Therapeutic exercise;Therapeutic activities;Functional mobility training;Gait training;Stair training;Patient/family education;Manual techniques;Dry needling;Taping   PT Next Visit Plan Continue with painfree L quad/knee strengthening per MPT POC.   PT Home Exercise Plan hamstring sets, hamstring stretch, knee to opposite shoulder, prone hamstring  curls   Consulted and Agree with Plan of Care Patient      Patient will benefit from skilled therapeutic intervention in order to improve the following deficits and impairments:  Abnormal gait, Pain, Impaired flexibility  Visit Diagnosis: Acute pain of left knee  Difficulty in walking, not elsewhere classified  Muscle weakness (generalized)     Problem List Patient Active Problem  List   Diagnosis Date Noted  . Morbid obesity (HCC) 03/24/2015    Evelene Croon, PTA 05/15/2017, 10:19 AM  [facility] [address] [place], [place], [address] Phone: [phone]   Fax:  [phone]  Name: AIYLA BAUCOM MRN: [id number] Date of Birth: 1976/06/27

## 2017-05-18 ENCOUNTER — Encounter: Payer: Self-pay | Admitting: Physical Therapy

## 2017-05-18 ENCOUNTER — Ambulatory Visit: Payer: Managed Care, Other (non HMO) | Admitting: Physical Therapy

## 2017-05-18 DIAGNOSIS — M25562 Pain in left knee: Secondary | ICD-10-CM

## 2017-05-18 DIAGNOSIS — M6281 Muscle weakness (generalized): Secondary | ICD-10-CM

## 2017-05-18 DIAGNOSIS — R262 Difficulty in walking, not elsewhere classified: Secondary | ICD-10-CM

## 2017-05-18 NOTE — Therapy (Signed)
[facility] [address] [place], [place], [address] Phone: [phone]   Fax:  [phone]  Physical Therapy Treatment  Patient Details  Name: Carla PlumberDeidra K Rode MRN: [id number] Date of Birth: 04/18/1976 Referring Provider: Dr. Jamelle HaringSnow Rogers  Encounter Date: 05/18/2017      PT End of Session - 05/18/17 0950    Visit Number 14   Number of Visits 32   Date for PT Re-Evaluation 05/23/17   PT Start Time 0943   PT Stop Time 1031   PT Time Calculation (min) 48 min   Activity Tolerance Patient tolerated treatment well   Behavior During Therapy [facility] for tasks assessed/performed      Past Medical History:  Diagnosis Date  . Anxiety   . Complication of anesthesia    slow to wake up   . Depression   . Diabetes mellitus without complication (HCC)   . GERD (gastroesophageal reflux disease)   . Hypertension   . Overactive bladder   . PONV (postoperative nausea and vomiting)   . Sleep apnea    cpap     Past Surgical History:  Procedure Laterality Date  . CHOLECYSTECTOMY    . COLONOSCOPY    . exploratory vaginal surgery     . LAPAROSCOPIC GASTRIC SLEEVE RESECTION N/A 03/24/2015   Procedure: LAPAROSCOPIC GASTRIC SLEEVE RESECTION UPPER ENDOSCOPY;  Surgeon: Ovidio Kinavid Newman, MD;  Location: [facility];  Service: General;  Laterality: N/A;  . LAPAROSCOPY    . pilonidal cyst removal     . UPPER GI ENDOSCOPY  03/24/2015   Procedure: UPPER GI ENDOSCOPY;  Surgeon: Ovidio Kinavid Newman, MD;  Location: [facility];  Service: General;;    There were no vitals filed for this visit.      Subjective Assessment - 05/18/17 0942    Subjective Reports that she just can't walk much and has to walk with her knee locked. Her knee has locked up on her three times this morning. Reports that she has had terrible pain since last Thursday. Posteriosuperior L calf tenderness and sensitivity is returning.   Pertinent History genu recurvatum , DM   Limitations Walking;House hold  activities   How long can you sit comfortably? unlimited   How long can you stand comfortably? 5 minutes   How long can you walk comfortably? 5 minutes   Patient Stated Goals I want to be able to keep up with my 40month old and 2 other children.    Currently in Pain? Yes   Pain Score 7    Pain Location Knee   Pain Orientation Left   Pain Descriptors / Indicators Discomfort   Pain Type Acute pain   Pain Onset More than a month ago            [facility] - 05/18/17 0001      Assessment   Medical Diagnosis left hamstring injury   Onset Date/Surgical Date 01/28/17   Hand Dominance Right   Next MD Visit 06/02/2017   Prior Therapy yes, shoulder, several years ago     Precautions   Precautions None   Required Braces or Orthoses Other Brace/Splint   Other Brace/Splint knee support                     [facility] Adult PT Treatment/Exercise - 05/18/17 0001      Knee/Hip Exercises: Aerobic   Stationary Bike L2 x10 min     Modalities   Modalities Electrical Stimulation;Moist Heat;Ultrasound  Moist Heat Therapy   Number Minutes Moist Heat 15 Minutes   Moist Heat Location Knee     Electrical Stimulation   Electrical Stimulation Location Posteriosuperior L calf   Electrical Stimulation Action Pre-Mod   Electrical Stimulation Parameters 80-150 hz x15 min   Electrical Stimulation Goals Edema;Pain     Ultrasound   Ultrasound Location L posteriosuperior calf   Ultrasound Parameters 1.5 w/cm2, 100%,1 mhz x10 min   Ultrasound Goals Pain     Manual Therapy   Manual Therapy Myofascial release   Myofascial Release IASTW to L superior calf, medial and lateral distal HS to reduce tightness and pain                  PT Short Term Goals - 05/03/17 1450      PT SHORT TERM GOAL #1   Title pt will be independent with her initial HEP.    Time 3   Period Weeks   Status Achieved           PT Long Term Goals - 05/08/17 1040      PT LONG TERM GOAL  #1   Title Pt will improve her left LE knee flexion and extension strength to grossly 4+/5 in order to improve gait and functional mobility.    Time 8   Period Weeks   Status On-going     PT LONG TERM GOAL #2   Title Pt will improve her gait to step through gait pattern with equalized step length and heel to toe gait pattern for > 1000 feet on community level surfaces.    Time 8   Period Weeks   Status On-going     PT LONG TERM GOAL #3   Title Pt will report no pain with ADL's.    Time 8   Period Weeks   Status On-going               Plan - 05/18/17 1036    Clinical Impression Statement Patient presented in clinic with continued L knee pain and gait deviation of L knee extension in swing phase to avoid pain. Patient still experiencing episodes of her L knee locking. No increased pain while on stationary bike per patient report. Patient reported that superior calf sensitivty and tenderness returning thus [place] and IASTW completed today. Patient very sensitive in mid superior L calf today with minimal redness in response to IASTW. Normal modalities response noted following removal of the modalities.   Rehab Potential Good   PT Frequency 2x / week   PT Duration 8 weeks   PT Treatment/Interventions ADLs/Self Care Home Management;Electrical Stimulation;Ultrasound;Passive range of motion;Neuromuscular re-education;Balance training;Therapeutic exercise;Therapeutic activities;Functional mobility training;Gait training;Stair training;Patient/family education;Manual techniques;Dry needling;Taping   PT Next Visit Plan Continue with painfree L quad/knee strengthening per MPT POC.   PT Home Exercise Plan hamstring sets, hamstring stretch, knee to opposite shoulder, prone hamstring curls   Consulted and Agree with Plan of Care Patient      Patient will benefit from skilled therapeutic intervention in order to improve the following deficits and impairments:  Abnormal gait, Pain, Impaired  flexibility  Visit Diagnosis: Acute pain of left knee  Difficulty in walking, not elsewhere classified  Muscle weakness (generalized)     Problem List Patient Active Problem List   Diagnosis Date Noted  . Morbid obesity (HCC) 03/24/2015    Evelene Croon, PTA 05/18/2017, 10:42 AM  [facility] [address] [place], [place], [address] Phone: [phone]  Fax:  [phone]  Name: Carla PlumberDeidra K Copelin MRN: [id number] Date of Birth: 06/12/1976

## 2017-05-22 ENCOUNTER — Ambulatory Visit: Payer: Managed Care, Other (non HMO) | Admitting: Physical Therapy

## 2017-05-22 ENCOUNTER — Encounter: Payer: Self-pay | Admitting: Physical Therapy

## 2017-05-22 DIAGNOSIS — R262 Difficulty in walking, not elsewhere classified: Secondary | ICD-10-CM

## 2017-05-22 DIAGNOSIS — M6281 Muscle weakness (generalized): Secondary | ICD-10-CM

## 2017-05-22 DIAGNOSIS — M25562 Pain in left knee: Secondary | ICD-10-CM | POA: Diagnosis not present

## 2017-05-22 NOTE — Therapy (Addendum)
[facility] [place], [place], [address] Phone: [phone]   Fax:  [phone]  Physical Therapy Treatment  Patient Details  Name: Carla Rogers MRN: [id number] Date of Birth: [birth year] Referring Provider: Dr. Emogene Morgan Daws  Encounter Date: 05/22/2017      PT End of Session - 05/22/17 0954    Visit Number 15   Number of Visits 32   Date for PT Re-Evaluation 05/23/17   PT Start Time 0949   PT Stop Time 1042   PT Time Calculation (min) 53 min   Activity Tolerance Patient tolerated treatment well   Behavior During Therapy [facility] for tasks assessed/performed      Past Medical History:  Diagnosis Date  . Anxiety   . Complication of anesthesia    slow to wake up   . Depression   . Diabetes mellitus without complication ([place])   . GERD (gastroesophageal reflux disease)   . Hypertension   . Overactive bladder   . PONV (postoperative nausea and vomiting)   . Sleep apnea    cpap     Past Surgical History:  Procedure Laterality Date  . CHOLECYSTECTOMY    . COLONOSCOPY    . exploratory vaginal surgery     . LAPAROSCOPIC GASTRIC SLEEVE RESECTION N/A 03/24/2015   Procedure: LAPAROSCOPIC GASTRIC SLEEVE RESECTION UPPER ENDOSCOPY;  Surgeon: Alphonsa Overall, MD;  Location: [facility];  Service: General;  Laterality: N/A;  . LAPAROSCOPY    . pilonidal cyst removal     . UPPER GI ENDOSCOPY  03/24/2015   Procedure: UPPER GI ENDOSCOPY;  Surgeon: Alphonsa Overall, MD;  Location: [facility];  Service: General;;    There were no vitals filed for this visit.      Subjective Assessment - 05/22/17 0953    Subjective Reports that her knee is locking very frequently now and at times has a difficult time getting it to unlock. Reports that she has a new appointment with Dr. Theda Sers on 06/05/2017.   Pertinent History genu recurvatum , DM   Limitations Walking;House hold activities   How long can you sit comfortably? unlimited   How long can you stand  comfortably? 5 minutes   How long can you walk comfortably? 5 minutes   Patient Stated Goals I want to be able to keep up with my 76monthold and 2 other children.    Currently in Pain? Yes   Pain Score 5    Pain Location Knee   Pain Orientation Left   Pain Descriptors / Indicators Discomfort   Pain Type Acute pain   Pain Onset More than a month ago   Pain Frequency Intermittent            [facility] PT Assessment - 05/22/17 0001      Assessment   Medical Diagnosis left hamstring injury   Onset Date/Surgical Date 01/28/17   Hand Dominance Right   Next MD Visit 06/05/2017   Prior Therapy yes, shoulder, several years ago     Precautions   Precautions None   Required Braces or Orthoses Other Brace/Splint   Other Brace/Splint knee support                     [facility] Adult PT Treatment/Exercise - 05/22/17 0001      Knee/Hip Exercises: Aerobic   Stationary Bike L2 x10 min     Modalities   Modalities Electrical Stimulation;Moist Heat;Ultrasound     Moist Heat Therapy   Number Minutes  Moist Heat 15 Minutes   Moist Heat Location Knee     Electrical Stimulation   Electrical Stimulation Location Posterior L knee   Electrical Stimulation Action IFC   Electrical Stimulation Parameters 80-150 hz x15 min   Electrical Stimulation Goals Edema;Pain     Ultrasound   Ultrasound Location L distal HS attachments, superior calf   Ultrasound Parameters 1.5 w/cm2, 100%, 1 mhz x10 min   Ultrasound Goals Pain     Manual Therapy   Manual Therapy Myofascial release   Myofascial Release IASTW to B distal HS, superior calf with manual STW to reduce tone and tightness                  PT Short Term Goals - 05/03/17 1450      PT SHORT TERM GOAL #1   Title pt will be independent with her initial HEP.    Time 3   Period Weeks   Status Achieved           PT Long Term Goals - 05/08/17 1040      PT LONG TERM GOAL #1   Title Pt will improve her left LE knee  flexion and extension strength to grossly 4+/5 in order to improve gait and functional mobility.    Time 8   Period Weeks   Status On-going     PT LONG TERM GOAL #2   Title Pt will improve her gait to step through gait pattern with equalized step length and heel to toe gait pattern for > 1000 feet on community level surfaces.    Time 8   Period Weeks   Status On-going     PT LONG TERM GOAL #3   Title Pt will report no pain with ADL's.    Time 8   Period Weeks   Status On-going               Plan - 05/22/17 1047    Clinical Impression Statement Patient continues to experience very frequent episodes of L knee locking and feels like she has to walk in L knee extension almost costantly. Patient continues to experience L HS tightness and requires L hip flexion and with knee flexion to assist with unlocking if possible. Increased redness presented over the B distal attachments of B HS tendons along with minimal increased tightness of superior gastroc. Normal modalities response noted following removal of the modalities.   Rehab Potential Good   PT Frequency 2x / week   PT Duration 8 weeks   PT Treatment/Interventions ADLs/Self Care Home Management;Electrical Stimulation;Ultrasound;Passive range of motion;Neuromuscular re-education;Balance training;Therapeutic exercise;Therapeutic activities;Functional mobility training;Gait training;Stair training;Patient/family education;Manual techniques;Dry needling;Taping   PT Next Visit Plan Continue with painfree L quad/knee strengthening per MPT POC.   PT Home Exercise Plan hamstring sets, hamstring stretch, knee to opposite shoulder, prone hamstring curls   Consulted and Agree with Plan of Care Patient      Patient will benefit from skilled therapeutic intervention in order to improve the following deficits and impairments:  Abnormal gait, Pain, Impaired flexibility  Visit Diagnosis: Acute pain of left knee  Difficulty in walking, not  elsewhere classified  Muscle weakness (generalized)     Problem List Patient Active Problem List   Diagnosis Date Noted  . Morbid obesity ([place]) 03/24/2015    Carla Rogers, PTA 05/22/2017, 10:55 AM  [facility] [address] [place], [place], [address] Phone: [phone]   Fax:  [phone]  Name: Shanara Schnieders  [place] MRN: [id number] Date of Birth: Jan 23, 1976   PHYSICAL THERAPY DISCHARGE SUMMARY  Visits from Start of Care: 15.  Current functional level related to goals / functional outcomes: See above.   Remaining deficits: See above.   Education / Equipment: HEP. Plan: Patient agrees to discharge.  Patient goals were not met. Patient is being discharged due to the physician's request.  ?????         Signed and Held Orders    None

## 2017-05-24 ENCOUNTER — Encounter: Payer: Managed Care, Other (non HMO) | Admitting: Physical Therapy

## 2017-05-25 ENCOUNTER — Encounter: Payer: Managed Care, Other (non HMO) | Admitting: Physical Therapy

## 2017-07-18 ENCOUNTER — Ambulatory Visit (HOSPITAL_COMMUNITY)
Admission: RE | Admit: 2017-07-18 | Discharge: 2017-07-18 | Disposition: A | Discharge status: Home / Self Care | Payer: Managed Care, Other (non HMO) | Source: Ambulatory Visit | Attending: Specialist | Admitting: Specialist

## 2017-07-18 ENCOUNTER — Other Ambulatory Visit: Payer: Self-pay | Admitting: Specialist

## 2017-07-18 ENCOUNTER — Ambulatory Visit: Payer: Managed Care, Other (non HMO) | Admitting: Physical Therapy

## 2017-07-18 ENCOUNTER — Other Ambulatory Visit: Payer: Managed Care, Other (non HMO)

## 2017-07-18 ENCOUNTER — Encounter (HOSPITAL_COMMUNITY): Payer: Self-pay

## 2017-07-18 ENCOUNTER — Ambulatory Visit (HOSPITAL_COMMUNITY): Admission: RE | Admit: 2017-07-18 | Payer: Managed Care, Other (non HMO) | Source: Ambulatory Visit

## 2017-07-18 ENCOUNTER — Other Ambulatory Visit (HOSPITAL_COMMUNITY): Payer: Self-pay | Admitting: Specialist

## 2017-07-18 DIAGNOSIS — M7989 Other specified soft tissue disorders: Principal | ICD-10-CM

## 2017-07-18 DIAGNOSIS — M79605 Pain in left leg: Secondary | ICD-10-CM

## 2017-07-18 DIAGNOSIS — M1712 Unilateral primary osteoarthritis, left knee: Secondary | ICD-10-CM | POA: Diagnosis not present

## 2017-07-21 ENCOUNTER — Ambulatory Visit: Payer: Managed Care, Other (non HMO) | Attending: Specialist | Admitting: Physical Therapy

## 2017-07-21 ENCOUNTER — Encounter: Payer: Self-pay | Admitting: Physical Therapy

## 2017-07-21 DIAGNOSIS — R262 Difficulty in walking, not elsewhere classified: Secondary | ICD-10-CM | POA: Insufficient documentation

## 2017-07-21 DIAGNOSIS — M25562 Pain in left knee: Secondary | ICD-10-CM | POA: Insufficient documentation

## 2017-07-21 DIAGNOSIS — M6281 Muscle weakness (generalized): Secondary | ICD-10-CM | POA: Diagnosis present

## 2017-07-21 NOTE — Therapy (Signed)
[facility] [address] [place], [place], [address] Phone: [phone]   Fax:  [phone]  Physical Therapy Evaluation  Patient Details  Name: Carla PlumberDeidra K Isidore MRN: [id number] Date of Birth: 05/10/1976 Referring Provider: Dr. Jamelle HaringSnow Daws   Encounter Date: 07/21/2017  PT End of Session - 07/21/17 1219    Visit Number  1    Number of Visits  16    PT Start Time  0900    PT Stop Time  1000    PT Time Calculation (min)  60 min    Activity Tolerance  Patient tolerated treatment well    Behavior During Therapy  [facility] for tasks assessed/performed       Past Medical History:  Diagnosis Date  . Anxiety   . Complication of anesthesia    slow to wake up   . Depression   . Diabetes mellitus without complication (HCC)   . GERD (gastroesophageal reflux disease)   . Hypertension   . Overactive bladder   . PONV (postoperative nausea and vomiting)   . Sleep apnea    cpap     Past Surgical History:  Procedure Laterality Date  . CHOLECYSTECTOMY    . COLONOSCOPY    . exploratory vaginal surgery     . LAPAROSCOPIC GASTRIC SLEEVE RESECTION N/A 03/24/2015   Procedure: LAPAROSCOPIC GASTRIC SLEEVE RESECTION UPPER ENDOSCOPY;  Surgeon: Ovidio Kinavid Newman, MD;  Location: [facility];  Service: General;  Laterality: N/A;  . LAPAROSCOPY    . pilonidal cyst removal     . UPPER GI ENDOSCOPY  03/24/2015   Procedure: UPPER GI ENDOSCOPY;  Surgeon: Ovidio Kinavid Newman, MD;  Location: [facility];  Service: General;;    There were no vitals filed for this visit.   Subjective Assessment - 07/21/17 1202    Subjective  Pt arriving to therapy today following a left knee scope/deb on 07/11/17. Pt reporting pain of 5/10          [facility] - 07/21/17 0001      Assessment   Medical Diagnosis  left knee arthroscopy    Hand Dominance  Right    Prior Therapy  yes, prior to knee surgery      Precautions   Precautions  None      Home Environment   Living Environment   Private residence    Living Arrangements  Spouse/significant other    Available Help at Discharge  Family    Type of Home  House    Home Access  Stairs to enter    Entrance Stairs-Number of Steps  1    Entrance Stairs-Rails  Right    Home Layout  One level      Prior Function   Level of Independence  Independent      Cognition   Overall Cognitive Status  Within Functional Limits for tasks assessed      AROM   Left Knee Extension  0    Left Knee Flexion  110      PROM   Left Knee Extension  -3    Left Knee Flexion  112      Strength   Left Knee Flexion  4-/5    Left Knee Extension  4-/5      Palpation   Patella mobility  WFL    Palpation comment  soreness around incision sites      Ambulation/Gait   Gait Comments  pt amb with anatalgic gait on left with decrease stance time.  Objective measurements completed on examination: See above findings.              PT Education - 07/21/17 1218    Education provided  Yes    Education Details  HEP    Person(s) Educated  Patient    Methods  Explanation;Handout;Verbal cues;Demonstration    Comprehension  Verbalized understanding;Returned demonstration       PT Short Term Goals - 05/03/17 1450      PT SHORT TERM GOAL #1   Title  pt will be independent with her initial HEP.     Time  3    Period  Weeks    Status  Achieved        PT Long Term Goals - 07/21/17 1224      PT LONG TERM GOAL #1   Title  Pt will improve her left LE knee flexion and extension strength to grossly 4+/5 in order to improve gait and functional mobility.     Time  8    Period  Weeks    Status  New    Target Date  09/21/17      PT LONG TERM GOAL #2   Title  Pt will improve her gait to step through gait pattern with equalized step length and heel to toe gait pattern for > 1000 feet on community level surfaces.     Time  8    Period  Weeks    Status  New    Target Date  09/21/17      PT LONG TERM GOAL #3   Title   Pt will report no pain with ADL's.     Time  8    Period  Weeks    Status  On-going    Target Date  09/21/17      PT LONG TERM GOAL #4   Title  Pt will improve her left knee flexion to >/= 120 degrees in order to improve functional mobility.     Time  8    Period  Weeks    Status  New    Target Date  09/21/17             Plan - 07/21/17 1220    Clinical Impression Statement  Patient arriving today following her left knee scope/Deb on 07/11/17. Pt with intact medial and lateral mensicusi per post-op report. Pt with reporting not having to wear her knee brace for support since her surgery and being able to sit down to use the rest room. without her knees locking up. Pt with 0-110 degrees ROM actively with MMT strenth in left knee grossly -4/5. Skilled PT needed to progress pt toward pain free functional mobility/gait and increased strength and ROM.     History and Personal Factors relevant to plan of care:  joint laxity    Clinical Presentation  Stable    Clinical Decision Making  Low    Rehab Potential  Excellent    PT Frequency  2x / week    PT Duration  8 weeks    PT Treatment/Interventions  ADLs/Self Care Home Management;Electrical Stimulation;Ultrasound;Passive range of motion;Neuromuscular re-education;Balance training;Therapeutic exercise;Therapeutic activities;Functional mobility training;Gait training;Stair training;Patient/family education;Manual techniques;Dry needling;Taping    PT Next Visit Plan  Continue with painfree L quad/knee strengthening per MPT POC.    PT Home Exercise Plan  quad sets, SLR, heelslides    Consulted and Agree with Plan of Care  Patient       Patient will benefit from  skilled therapeutic intervention in order to improve the following deficits and impairments:  Abnormal gait, Pain, Impaired flexibility  Visit Diagnosis: Acute pain of left knee  Difficulty in walking, not elsewhere classified  Muscle weakness (generalized)     Problem  List Patient Active Problem List   Diagnosis Date Noted  . Morbid obesity (HCC) 03/24/2015    Sharmon Leyden, MPT 07/21/2017, 12:28 PM  [facility] [address] [place], [place], [address] Phone: [phone]   Fax:  [phone]  Name: OHANNA GASSERT MRN: [id number] Date of Birth: November 05, 1975

## 2017-07-27 ENCOUNTER — Ambulatory Visit: Payer: Managed Care, Other (non HMO) | Admitting: *Deleted

## 2017-07-27 DIAGNOSIS — M6281 Muscle weakness (generalized): Secondary | ICD-10-CM

## 2017-07-27 DIAGNOSIS — M25562 Pain in left knee: Secondary | ICD-10-CM

## 2017-07-27 DIAGNOSIS — R262 Difficulty in walking, not elsewhere classified: Secondary | ICD-10-CM

## 2017-07-27 NOTE — Therapy (Signed)
[facility] [address] [place], [place], [address] Phone: [phone]   Fax:  [phone]  Physical Therapy Treatment  Patient Details  Name: Carla Rogers Rogers MRN: [id number] Date of Birth: 1975-12-15 Referring Provider: Dr. Jamelle Haring Rogers   Encounter Date: 07/27/2017  PT End of Session - 07/27/17 1616    Visit Number  2    Number of Visits  16    Date for PT Re-Evaluation  09/15/17    PT Start Time  1515    PT Stop Time  1614    PT Time Calculation (min)  59 min       Past Medical History:  Diagnosis Date  . Anxiety   . Complication of anesthesia    slow to wake up   . Depression   . Diabetes mellitus without complication (HCC)   . GERD (gastroesophageal reflux disease)   . Hypertension   . Overactive bladder   . PONV (postoperative nausea and vomiting)   . Sleep apnea    cpap     Past Surgical History:  Procedure Laterality Date  . CHOLECYSTECTOMY    . COLONOSCOPY    . exploratory vaginal surgery     . LAPAROSCOPIC GASTRIC SLEEVE RESECTION N/A 03/24/2015   Procedure: LAPAROSCOPIC GASTRIC SLEEVE RESECTION UPPER ENDOSCOPY;  Surgeon: Carla Rogers Kin, MD;  Location: [facility];  Service: General;  Laterality: N/A;  . LAPAROSCOPY    . pilonidal cyst removal     . UPPER GI ENDOSCOPY  03/24/2015   Procedure: UPPER GI ENDOSCOPY;  Surgeon: Carla Rogers Kin, MD;  Location: [facility];  Service: General;;    There were no vitals filed for this visit.  Subjective Assessment - 07/27/17 1523    Subjective  Pt arriving to therapy today following a left knee scope/deb on 07/11/17. Pt reporting pain of 5/10     Pertinent History  genu recurvatum , DM    Limitations  Walking;House hold activities    How long can you sit comfortably?  unlimited    How long can you stand comfortably?  5 minutes    How long can you walk comfortably?  5 minutes    Patient Stated Goals  I want to be able to keep up with my 71month old and 2 other children.     Currently  in Pain?  Yes    Pain Score  5     Pain Descriptors / Indicators  Discomfort    Pain Type  Acute pain    Pain Onset  More than a month ago    Pain Frequency  Intermittent                      [facility] Adult PT Treatment/Exercise - 07/27/17 0001      Knee/Hip Exercises: Aerobic   Stationary Bike  L1-2 x     Nustep  L 3 x8  min      Knee/Hip Exercises: Seated   Long Arc Quad  Left;3 sets;10 reps    Long Arc Quad Weight  2 lbs.      Knee/Hip Exercises: Supine   Quad Sets  AROM;Left;2 sets;10 reps    Straight Leg Raises  AROM;Left;2 sets;10 reps      Modalities   Modalities  Programmer, applications Location  LT knee premod x 15 mins 1-10 hz    Electrical Stimulation Goals  Edema;Pain  Vasopneumatic   Number Minutes Vasopneumatic   15 minutes    Vasopnuematic Location   Knee    Vasopneumatic Pressure  Medium    Vasopneumatic Temperature   34               PT Short Term Goals - 05/03/17 1450      PT SHORT TERM GOAL #1   Title  pt will be independent with her initial HEP.     Time  3    Period  Weeks    Status  Achieved        PT Long Term Goals - 07/21/17 1224      PT LONG TERM GOAL #1   Title  Pt will improve her left LE knee flexion and extension strength to grossly 4+/5 in order to improve gait and functional mobility.     Time  8    Period  Weeks    Status  New    Target Date  09/21/17      PT LONG TERM GOAL #2   Title  Pt will improve her gait to step through gait pattern with equalized step length and heel to toe gait pattern for > 1000 feet on community level surfaces.     Time  8    Period  Weeks    Status  New    Target Date  09/21/17      PT LONG TERM GOAL #3   Title  Pt will report no pain with ADL's.     Time  8    Period  Weeks    Status  On-going    Target Date  09/21/17      PT LONG TERM GOAL #4   Title  Pt will improve her left knee flexion  to >/= 120 degrees in order to improve functional mobility.     Time  8    Period  Weeks    Status  New    Target Date  09/21/17            Plan - 07/27/17 1537    Clinical Impression Statement  Pt arrived today doing fairly well with pain being less. She complains about pain posterior aspect with knee flexion. Pt did well with bike and Nustep today and was able to perform LAQs with 2#s with no increased pain. Her ROM  today was 0-135 degrees. Normal response to modalities today.    Rehab Potential  Excellent    PT Frequency  2x / week    PT Duration  8 weeks    PT Treatment/Interventions  ADLs/Self Care Home Management;Electrical Stimulation;Ultrasound;Passive range of motion;Neuromuscular re-education;Balance training;Therapeutic exercise;Therapeutic activities;Functional mobility training;Gait training;Stair training;Patient/family education;Manual techniques;Dry needling;Taping    PT Next Visit Plan  Continue with painfree L quad/knee strengthening per MPT POC.    PT Home Exercise Plan  quad sets, SLR, heelslides    Consulted and Agree with Plan of Care  Patient       Patient will benefit from skilled therapeutic intervention in order to improve the following deficits and impairments:  Abnormal gait, Pain, Impaired flexibility  Visit Diagnosis: Acute pain of left knee  Difficulty in walking, not elsewhere classified  Muscle weakness (generalized)     Problem List Patient Active Problem List   Diagnosis Date Noted  . Morbid obesity (HCC) 03/24/2015    Carla Rogers Rogers,Carla Rogers Rogers, PTA 07/27/2017, 4:27 PM  [facility] [address] [place], [place], [address] Phone: [phone]  Fax:  [phone]  Name: Carla Rogers Rogers MRN: [id number] Date of Birth: 06/12/1976

## 2017-07-31 ENCOUNTER — Ambulatory Visit: Payer: Managed Care, Other (non HMO) | Admitting: *Deleted

## 2017-07-31 DIAGNOSIS — M6281 Muscle weakness (generalized): Secondary | ICD-10-CM

## 2017-07-31 DIAGNOSIS — M25562 Pain in left knee: Secondary | ICD-10-CM | POA: Diagnosis not present

## 2017-07-31 DIAGNOSIS — R262 Difficulty in walking, not elsewhere classified: Secondary | ICD-10-CM

## 2017-07-31 NOTE — Therapy (Signed)
[facility] [address] [place], [place], [address] Phone: [phone]   Fax:  [phone]  Physical Therapy Treatment  Patient Details  Name: Carla PlumberDeidra K Rogers MRN: [id number] Date of Birth: 03/07/1976 Referring Provider: Dr. Jamelle HaringSnow Daws   Encounter Date: 07/31/2017  PT End of Session - 07/31/17 1228    Visit Number  3    Number of Visits  16    Date for PT Re-Evaluation  09/15/17    PT Start Time  1030    PT Stop Time  1127    PT Time Calculation (min)  57 min       Past Medical History:  Diagnosis Date  . Anxiety   . Complication of anesthesia    slow to wake up   . Depression   . Diabetes mellitus without complication (HCC)   . GERD (gastroesophageal reflux disease)   . Hypertension   . Overactive bladder   . PONV (postoperative nausea and vomiting)   . Sleep apnea    cpap     Past Surgical History:  Procedure Laterality Date  . CHOLECYSTECTOMY    . COLONOSCOPY    . exploratory vaginal surgery     . LAPAROSCOPIC GASTRIC SLEEVE RESECTION N/A 03/24/2015   Procedure: LAPAROSCOPIC GASTRIC SLEEVE RESECTION UPPER ENDOSCOPY;  Surgeon: Ovidio Kinavid Newman, MD;  [facility]: [facility];  Service: General;  Laterality: N/A;  . LAPAROSCOPY    . pilonidal cyst removal     . UPPER GI ENDOSCOPY  03/24/2015   Procedure: UPPER GI ENDOSCOPY;  Surgeon: Ovidio Kinavid Newman, MD;  [facility];  Service: General;;    There were no vitals filed for this visit.  Subjective Assessment - 07/31/17 1049    Subjective  Pt arriving to therapy today following a left knee scope/deb on 07/11/17. Pt reporting pain of 5/10   (Pended)     Pertinent History  genu recurvatum , DM  (Pended)     Limitations  Walking;House hold activities  (Pended)     How long can you sit comfortably?  unlimited  (Pended)     How long can you stand comfortably?  5 minutes  (Pended)     How long can you walk comfortably?  5 minutes  (Pended)     Patient Stated Goals  I want to be able to  keep up with my 43month old and 2 other children.   (Pended)                       [facility] Adult PT Treatment/Exercise - 07/31/17 0001      Knee/Hip Exercises: Aerobic   Stationary Bike  L1-2 x  15min      Knee/Hip Exercises: Seated   Long Arc Quad  Left;3 sets;10 reps    Long Arc Quad Weight  2 lbs.      Modalities   Modalities  Programmer, applicationslectrical Stimulation;Vasopneumatic      Electrical Stimulation   Electrical Stimulation Location  LT knee premod x 15 mins 1-10 hz    Electrical Stimulation Goals  Edema;Pain      Ultrasound   Ultrasound Location  LT knee post aspect 1.5 w/cm2 x 10 mins    Ultrasound Goals  Pain      Vasopneumatic   Number Minutes Vasopneumatic   15 minutes    Vasopnuematic Location   Knee    Vasopneumatic Pressure  Medium    Vasopneumatic Temperature   34      Manual  Therapy   Manual Therapy  Soft tissue mobilization    Soft tissue mobilization  STW/TPR to L knee inf and post knee calf area to decrease  pain and  tone                  PT Long Term Goals - 07/21/17 1224      PT LONG TERM GOAL #1   Title  Pt will improve her left LE knee flexion and extension strength to grossly 4+/5 in order to improve gait and functional mobility.     Time  8    Period  Weeks    Status  New    Target Date  09/21/17      PT LONG TERM GOAL #2   Title  Pt will improve her gait to step through gait pattern with equalized step length and heel to toe gait pattern for > 1000 feet on community level surfaces.     Time  8    Period  Weeks    Status  New    Target Date  09/21/17      PT LONG TERM GOAL #3   Title  Pt will report no pain with ADL's.     Time  8    Period  Weeks    Status  On-going    Target Date  09/21/17      PT LONG TERM GOAL #4   Title  Pt will improve her left knee flexion to >/= 120 degrees in order to improve functional mobility.     Time  8    Period  Weeks    Status  New    Target Date  09/21/17               Patient will benefit from skilled therapeutic intervention in order to improve the following deficits and impairments:     Visit Diagnosis: Acute pain of left knee  Difficulty in walking, not elsewhere classified  Muscle weakness (generalized)     Problem List Patient Active Problem List   Diagnosis Date Noted  . Morbid obesity (HCC) 03/24/2015    Bexton Haak,CHRIS, PTA 07/31/2017, 12:37 PM  [facility] [address] [place], [place], [address] Phone: [phone]   Fax:  [phone]  Name: Carla Rogers MRN: [id number] Date of Birth: 07/22/1976

## 2017-08-03 ENCOUNTER — Ambulatory Visit: Payer: Managed Care, Other (non HMO) | Attending: Specialist | Admitting: Physical Therapy

## 2017-08-03 ENCOUNTER — Encounter: Payer: Self-pay | Admitting: Physical Therapy

## 2017-08-03 DIAGNOSIS — M6281 Muscle weakness (generalized): Secondary | ICD-10-CM | POA: Diagnosis present

## 2017-08-03 DIAGNOSIS — M25562 Pain in left knee: Secondary | ICD-10-CM

## 2017-08-03 DIAGNOSIS — R262 Difficulty in walking, not elsewhere classified: Secondary | ICD-10-CM | POA: Insufficient documentation

## 2017-08-03 NOTE — Therapy (Signed)
[facility] [address] [place], [place], [address] Phone: [phone]   Fax:  [phone]  Physical Therapy Treatment  Patient Details  Name: Carla Rogers MRN: [id number] Date of Birth: 1975/10/30 Referring Provider: Dr. Jamelle Haring Daws   Encounter Date: 08/03/2017  PT End of Session - 08/03/17 1352    Visit Number  4    Number of Visits  16    Date for PT Re-Evaluation  09/15/17    PT Start Time  1349    PT Stop Time  1439    PT Time Calculation (min)  50 min    Activity Tolerance  Patient tolerated treatment well    Behavior During Therapy  [facility] for tasks assessed/performed       Past Medical History:  Diagnosis Date  . Anxiety   . Complication of anesthesia    slow to wake up   . Depression   . Diabetes mellitus without complication (HCC)   . GERD (gastroesophageal reflux disease)   . Hypertension   . Overactive bladder   . PONV (postoperative nausea and vomiting)   . Sleep apnea    cpap     Past Surgical History:  Procedure Laterality Date  . CHOLECYSTECTOMY    . COLONOSCOPY    . exploratory vaginal surgery     . LAPAROSCOPIC GASTRIC SLEEVE RESECTION N/A 03/24/2015   Procedure: LAPAROSCOPIC GASTRIC SLEEVE RESECTION UPPER ENDOSCOPY;  Surgeon: Ovidio Kin, MD;  [facility]: [facility];  Service: General;  Laterality: N/A;  . LAPAROSCOPY    . pilonidal cyst removal     . UPPER GI ENDOSCOPY  03/24/2015   Procedure: UPPER GI ENDOSCOPY;  Surgeon: Ovidio Kin, MD;  [facility]: [facility];  Service: General;;    There were no vitals filed for this visit.  Subjective Assessment - 08/03/17 1351    Subjective  Patient reports upon arrival of increased pain and locking sensation beginning again. Reports that she called MD office to see about medication to control pain. Reports increased pain following previous treatment. Reports that she tried stairs at her home yesterday but had increased pain and stated that she had increased pain since her  son tried to climb up her leg.    Pertinent History  genu recurvatum , DM    Limitations  Walking;House hold activities    How long can you sit comfortably?  unlimited    How long can you stand comfortably?  5 minutes    How long can you walk comfortably?  5 minutes    Patient Stated Goals  I want to be able to keep up with my 32month old and 2 other children.     Currently in Pain?  Other (Comment) No pain assessment provided by patient but facial grimacing present         [facility] PT Assessment - 08/03/17 0001      Assessment   Medical Diagnosis  left knee arthroscopy    Onset Date/Surgical Date  07/11/17    Hand Dominance  Right    Next MD Visit  08/23/2017    Prior Therapy  yes, prior to knee surgery      Precautions   Precautions  None                  [facility] Adult PT Treatment/Exercise - 08/03/17 0001      Knee/Hip Exercises: Aerobic   Stationary Bike  L1 x10 min      Modalities   Modalities  Baristalectrical Stimulation;Ultrasound;Vasopneumatic      Electrical Stimulation   Electrical Stimulation Location  L posteriomedial knee    Market researcherlectrical Stimulation Action  Pre-Mod    Electrical Stimulation Parameters  80-150 hz x15 min    Electrical Stimulation Goals  Edema;Pain      Ultrasound   Ultrasound Location  L superior calf     Ultrasound Parameters  1.5 w/cm2, 100%, 1 mhz x10 mn    Ultrasound Goals  Pain      Vasopneumatic   Number Minutes Vasopneumatic   15 minutes    Vasopnuematic Location   Knee    Vasopneumatic Pressure  Medium    Vasopneumatic Temperature   34      Manual Therapy   Manual Therapy  Soft tissue mobilization    Soft tissue mobilization  STW/TPR to L knee inf and post knee calf area to decrease pain                  PT Long Term Goals - 07/21/17 1224      PT LONG TERM GOAL #1   Title  Pt will improve her left LE knee flexion and extension strength to grossly 4+/5 in order to improve gait and functional mobility.     Time  8     Period  Weeks    Status  New    Target Date  09/21/17      PT LONG TERM GOAL #2   Title  Pt will improve her gait to step through gait pattern with equalized step length and heel to toe gait pattern for > 1000 feet on community level surfaces.     Time  8    Period  Weeks    Status  New    Target Date  09/21/17      PT LONG TERM GOAL #3   Title  Pt will report no pain with ADL's.     Time  8    Period  Weeks    Status  On-going    Target Date  09/21/17      PT LONG TERM GOAL #4   Title  Pt will improve her left knee flexion to >/= 120 degrees in order to improve functional mobility.     Time  8    Period  Weeks    Status  New    Target Date  09/21/17            Plan - 08/03/17 1443    Clinical Impression Statement  Patient very limited today secondary to increased pain in posterior L knee that she has developed over the last several days. Patient very tender to palpation over the medial L HS attachement intermittantly with manual therapy. Normal modalities response noted following removal of the modalities. Patient educated by [place], MPT to do PT once a week to allow the soft tissue to further heal without irritation. Patient also educated to ice and elevate as she can throughout the day.    Rehab Potential  Excellent    PT Frequency  2x / week    PT Duration  8 weeks    PT Treatment/Interventions  ADLs/Self Care Home Management;Electrical Stimulation;Ultrasound;Passive range of motion;Neuromuscular re-education;Balance training;Therapeutic exercise;Therapeutic activities;Functional mobility training;Gait training;Stair training;Patient/family education;Manual techniques;Dry needling;Taping    PT Next Visit Plan  Continue with painfree L quad/knee strengthening per MPT POC.    PT Home Exercise Plan  quad sets, SLR, heelslides    Consulted and Agree with Plan  of Care  Patient       Patient will benefit from skilled therapeutic intervention in order to improve  the following deficits and impairments:  Abnormal gait, Pain, Impaired flexibility  Visit Diagnosis: Acute pain of left knee  Difficulty in walking, not elsewhere classified  Muscle weakness (generalized)     Problem List Patient Active Problem List   Diagnosis Date Noted  . Morbid obesity (HCC) 03/24/2015    Marvell Fuller, PTA 08/03/2017, 2:59 PM  [facility] [address] [place], [place], [address] Phone: [phone]   Fax:  [phone]  Name: Carla Rogers MRN: [id number] Date of Birth: 10/27/1975

## 2017-08-07 ENCOUNTER — Encounter: Payer: Managed Care, Other (non HMO) | Admitting: Physical Therapy

## 2017-08-10 ENCOUNTER — Ambulatory Visit: Payer: Managed Care, Other (non HMO) | Admitting: Physical Therapy

## 2017-08-10 ENCOUNTER — Encounter: Payer: Self-pay | Admitting: Physical Therapy

## 2017-08-10 DIAGNOSIS — R262 Difficulty in walking, not elsewhere classified: Secondary | ICD-10-CM

## 2017-08-10 DIAGNOSIS — M25562 Pain in left knee: Secondary | ICD-10-CM | POA: Diagnosis not present

## 2017-08-10 DIAGNOSIS — M6281 Muscle weakness (generalized): Secondary | ICD-10-CM

## 2017-08-10 NOTE — Therapy (Signed)
[facility] [address] [place], [place], [address] Phone: [phone]   Fax:  [phone]  Physical Therapy Treatment  Patient Details  Name: Carla Rogers MRN: [id number] Date of Birth: [birth year] Referring Provider: Dr. Jamelle Haring Daws   Encounter Date: 08/10/2017  PT End of Session - 08/10/17 1407    Visit Number  5    Number of Visits  16    Date for PT Re-Evaluation  09/15/17    PT Start Time  1347    PT Stop Time  1445    PT Time Calculation (min)  58 min    Activity Tolerance  Patient tolerated treatment well    Behavior During Therapy  [facility] for tasks assessed/performed       Past Medical History:  Diagnosis Date  . Anxiety   . Complication of anesthesia    slow to wake up   . Depression   . Diabetes mellitus without complication (HCC)   . GERD (gastroesophageal reflux disease)   . Hypertension   . Overactive bladder   . PONV (postoperative nausea and vomiting)   . Sleep apnea    cpap     Past Surgical History:  Procedure Laterality Date  . CHOLECYSTECTOMY    . COLONOSCOPY    . exploratory vaginal surgery     . LAPAROSCOPIC GASTRIC SLEEVE RESECTION N/A 03/24/2015   Procedure: LAPAROSCOPIC GASTRIC SLEEVE RESECTION UPPER ENDOSCOPY;  Surgeon: Ovidio Kin, MD;  Location: [facility];  Service: General;  Laterality: N/A;  . LAPAROSCOPY    . pilonidal cyst removal     . UPPER GI ENDOSCOPY  03/24/2015   Procedure: UPPER GI ENDOSCOPY;  Surgeon: Ovidio Kin, MD;  Location: [facility];  Service: General;;    There were no vitals filed for this visit.  Subjective Assessment - 08/10/17 1348    Subjective  Reports that since once a week with PT she is noticing less pain. Still reports the same pain as before surgery if she jams her foot on an object. Still has a spot in posterior thigh     Pertinent History  genu recurvatum , DM    Limitations  Walking;House hold activities    How long can you sit comfortably?  unlimited    How long  can you stand comfortably?  5 minutes    How long can you walk comfortably?  5 minutes    Patient Stated Goals  I want to be able to keep up with my 58month old and 2 other children.     Currently in Pain?  No/denies         [facility] PT Assessment - 08/10/17 0001      Assessment   Medical Diagnosis  left knee arthroscopy    Onset Date/Surgical Date  07/11/17    Hand Dominance  Right    Next MD Visit  08/23/2017    Prior Therapy  yes, prior to knee surgery      Precautions   Precautions  None                  [facility] Adult PT Treatment/Exercise - 08/10/17 0001      Knee/Hip Exercises: Aerobic   Stationary Bike  L1 x10 min    Nustep  L2 x10 min      Modalities   Modalities  Electrical Stimulation;Cryotherapy;Ultrasound      Cryotherapy   Number Minutes Cryotherapy  15 Minutes    Cryotherapy Location  Knee  Type of Cryotherapy  Ice pack      Electrical Stimulation   Electrical Stimulation Location  L posteriomedial knee    Electrical Stimulation Action  Pre-Mod    Electrical Stimulation Parameters  80-150 hz x15 min    Electrical Stimulation Goals  Pain      Ultrasound   Ultrasound Location  L medial HS/ superior calf    Ultrasound Parameters  1.5 w/cm2, 100%, 1 mhz x10 min    Ultrasound Goals  Pain      Manual Therapy   Manual Therapy  Soft tissue mobilization    Soft tissue mobilization  STW/TPR to L knee inf and post knee calf area to decrease pain                  PT Long Term Goals - 07/21/17 1224      PT LONG TERM GOAL #1   Title  Pt will improve her left LE knee flexion and extension strength to grossly 4+/5 in order to improve gait and functional mobility.     Time  8    Period  Weeks    Status  New    Target Date  09/21/17      PT LONG TERM GOAL #2   Title  Pt will improve her gait to step through gait pattern with equalized step length and heel to toe gait pattern for > 1000 feet on community level surfaces.     Time  8     Period  Weeks    Status  New    Target Date  09/21/17      PT LONG TERM GOAL #3   Title  Pt will report no pain with ADL's.     Time  8    Period  Weeks    Status  On-going    Target Date  09/21/17      PT LONG TERM GOAL #4   Title  Pt will improve her left knee flexion to >/= 120 degrees in order to improve functional mobility.     Time  8    Period  Weeks    Status  New    Target Date  09/21/17            Plan - 08/10/17 1438    Clinical Impression Statement  Patient arrived today with reduced complaint of pain since reducing PT to once a week. Patient able to complete aerobic exercises with only a minimal and quick pain with stationary bike. Patient still tender along L medial HS attachment and had increased redness response with IASTW. Normal modalities response noted following removal of the modalities.    Rehab Potential  Excellent    PT Frequency  2x / week    PT Duration  8 weeks    PT Treatment/Interventions  ADLs/Self Care Home Management;Electrical Stimulation;Ultrasound;Passive range of motion;Neuromuscular re-education;Balance training;Therapeutic exercise;Therapeutic activities;Functional mobility training;Gait training;Stair training;Patient/family education;Manual techniques;Dry needling;Taping    PT Next Visit Plan  Continue with painfree L quad/knee strengthening per MPT POC.    PT Home Exercise Plan  quad sets, SLR, heelslides    Consulted and Agree with Plan of Care  Patient       Patient will benefit from skilled therapeutic intervention in order to improve the following deficits and impairments:  Abnormal gait, Pain, Impaired flexibility  Visit Diagnosis: Acute pain of left knee  Difficulty in walking, not elsewhere classified  Muscle weakness (generalized)     Problem List Patient  Active Problem List   Diagnosis Date Noted  . Morbid obesity (HCC) 03/24/2015    Marvell Fuller, PTA 08/10/2017, 2:48 PM  [facility] [address] [place], [place], [address] Phone: [phone]   Fax:  [phone]  Name: Carla Rogers MRN: [id number] Date of Birth: 23-Oct-1975

## 2017-08-16 ENCOUNTER — Encounter: Payer: Managed Care, Other (non HMO) | Admitting: Physical Therapy

## 2017-08-18 ENCOUNTER — Encounter: Payer: Managed Care, Other (non HMO) | Admitting: Physical Therapy

## 2017-08-21 ENCOUNTER — Ambulatory Visit: Payer: Managed Care, Other (non HMO) | Admitting: Physical Therapy

## 2017-08-21 DIAGNOSIS — M6281 Muscle weakness (generalized): Secondary | ICD-10-CM

## 2017-08-21 DIAGNOSIS — M25562 Pain in left knee: Secondary | ICD-10-CM

## 2017-08-21 NOTE — Therapy (Signed)
[facility] [place], [place], [address] Phone: [phone]   Fax:  [phone]  Physical Therapy Treatment  Patient Details  Name: Carla Rogers MRN: [id number] Date of Birth: Sep 24, 1975 Referring Provider: Dr. Sydnee Cabal   Encounter Date: 08/21/2017  PT End of Session - 08/21/17 0954    Visit Number  6    Number of Visits  16    Date for PT Re-Evaluation  09/15/17    PT Start Time  0948    PT Stop Time  1049    PT Time Calculation (min)  61 min    Activity Tolerance  Patient tolerated treatment well    Behavior During Therapy  [facility] for tasks assessed/performed       Past Medical History:  Diagnosis Date  . Anxiety   . Complication of anesthesia    slow to wake up   . Depression   . Diabetes mellitus without complication ([place])   . GERD (gastroesophageal reflux disease)   . Hypertension   . Overactive bladder   . PONV (postoperative nausea and vomiting)   . Sleep apnea    cpap     Past Surgical History:  Procedure Laterality Date  . CHOLECYSTECTOMY    . COLONOSCOPY    . exploratory vaginal surgery     . LAPAROSCOPIC GASTRIC SLEEVE RESECTION N/A 03/24/2015   Procedure: LAPAROSCOPIC GASTRIC SLEEVE RESECTION UPPER ENDOSCOPY;  Surgeon: Alphonsa Overall, MD;  Location: [facility];  Service: General;  Laterality: N/A;  . LAPAROSCOPY    . pilonidal cyst removal     . UPPER GI ENDOSCOPY  03/24/2015   Procedure: UPPER GI ENDOSCOPY;  Surgeon: Alphonsa Overall, MD;  Location: [facility];  Service: General;;    There were no vitals filed for this visit.  Subjective Assessment - 08/21/17 0954    Subjective  Patient reports improvement overall. She still has difficulty with steps and has to ice intermittently. She also reports calf pain intermittently.    Pertinent History  genu recurvatum , DM    How long can you sit comfortably?  unlimited    How long can you stand comfortably?  unlimited    How long can you walk comfortably?   unlimited    Patient Stated Goals  I want to be able to keep up with my 71monthold and 2 other children.     Currently in Pain?  Yes    Pain Score  2     Pain Location  Knee    Pain Orientation  Left    Pain Descriptors / Indicators  Discomfort    Pain Type  Acute pain    Pain Onset  More than a month ago    Pain Frequency  Intermittent                      [facility] Adult PT Treatment/Exercise - 08/21/17 0001      Exercises   Exercises  Knee/Hip      Knee/Hip Exercises: Stretches   Gastroc Stretch  Left;1 rep;30 seconds    Soleus Stretch  1 rep;Left;30 seconds      Knee/Hip Exercises: Aerobic   Stationary Bike  L1 x10 min      Knee/Hip Exercises: Standing   Step Down  Right;2 sets;10 reps;Hand Hold: 2;Step Height: 2"    Step Down Limitations  heel taps with right; pain noted in L knee    Wall Squat  2 sets;10 reps  Modalities   Modalities  Electrical Stimulation;Cryotherapy      Cryotherapy   Number Minutes Cryotherapy  15 Minutes    Cryotherapy Location  Knee    Type of Cryotherapy  Ice pack      Electrical Stimulation   Electrical Stimulation Location  L ant/post knee    Electrical Stimulation Action  premod    Electrical Stimulation Parameters  80-150 Hz x 1 9mn    Electrical Stimulation Goals  Pain      Manual Therapy   Manual Therapy  Soft tissue mobilization;Manual Traction    Soft tissue mobilization  to L gastroc/soleus/peroneals IASTM to same    Manual Traction  distraction to knee joint in sitting 3 x 20 seconds                   PT Long Term Goals - 08/21/17 09826     PT LONG TERM GOAL #1   Title  Pt will improve her left LE knee flexion and extension strength to grossly 4+/5 in order to improve gait and functional mobility.     Baseline  5/5 with MMT, but functionally she eccentric quad weakness    Time  8    Period  Weeks    Status  Partially Met      PT LONG TERM GOAL #2   Title  Pt will improve her gait to step  through gait pattern with equalized step length and heel to toe gait pattern for > 1000 feet on community level surfaces.     Baseline  mild gait deviations including decreased stride length    Time  8    Period  Weeks    Status  On-going      PT LONG TERM GOAL #3   Title  Pt will report no pain with ADL's.     Time  8    Period  Weeks    Status  On-going      PT LONG TERM GOAL #4   Title  Pt will improve her left knee flexion to >/= 120 degrees in order to improve functional mobility.     Baseline  125 deg    Time  8    Period  Weeks    Status  Achieved            Plan - 08/21/17 1551    Clinical Impression Statement  Patient is progressiing with LTGs meeting her ROM and MMT goals. She continues to have pain with stairs and has eccentric quad weakness with descent. She also c/o catch in knee with TKE which is affecting gait. She has marked tightness and TPs in her L gastroc/soleus and peroneals but does not exhibit restricted DF.     Rehab Potential  Excellent    PT Frequency  2x / week    PT Duration  8 weeks    PT Treatment/Interventions  ADLs/Self Care Home Management;Electrical Stimulation;Ultrasound;Passive range of motion;Neuromuscular re-education;Balance training;Therapeutic exercise;Therapeutic activities;Functional mobility training;Gait training;Stair training;Patient/family education;Manual techniques;Dry needling;Taping    PT Next Visit Plan  Continue with painfree L quad/knee strengthening per MPT POC.    PT Home Exercise Plan  quad sets, SLR, heelslides, wall slides    Consulted and Agree with Plan of Care  Patient       Patient will benefit from skilled therapeutic intervention in order to improve the following deficits and impairments:  Abnormal gait, Pain, Impaired flexibility  Visit Diagnosis: Acute pain of left knee  Muscle  weakness (generalized)     Problem List Patient Active Problem List   Diagnosis Date Noted  . Morbid obesity ([place])  03/24/2015    [name] PT 08/21/2017, 3:54 PM  [facility] [address] [place], [place], [address] Phone: [phone]   Fax:  [phone]  Name: [facility] MRN: [id number] Date of Birth: 03/22/1976

## 2017-09-01 ENCOUNTER — Ambulatory Visit: Payer: Managed Care, Other (non HMO) | Attending: Specialist | Admitting: Physical Therapy

## 2017-09-01 DIAGNOSIS — M25562 Pain in left knee: Secondary | ICD-10-CM | POA: Diagnosis present

## 2017-09-01 DIAGNOSIS — M6281 Muscle weakness (generalized): Secondary | ICD-10-CM

## 2017-09-01 DIAGNOSIS — M5442 Lumbago with sciatica, left side: Secondary | ICD-10-CM | POA: Insufficient documentation

## 2017-09-01 NOTE — Therapy (Signed)
[facility] [place], [place], [address] Phone: [phone]   Fax:  [phone]  Physical Therapy Treatment  Patient Details  Name: Carla Rogers MRN: [id number] Date of Birth: 1976/03/15 Referring Provider: Sydnee Cabal, MD   Encounter Date: 09/01/2017  PT End of Session - 09/01/17 0819    Visit Number  7    Number of Visits  16    Date for PT Re-Evaluation  09/15/17    PT Start Time  0815    PT Stop Time  0918    PT Time Calculation (min)  63 min    Activity Tolerance  Patient tolerated treatment well    Behavior During Therapy  [facility] for tasks assessed/performed       Past Medical History:  Diagnosis Date  . Anxiety   . Complication of anesthesia    slow to wake up   . Depression   . Diabetes mellitus without complication ([place])   . GERD (gastroesophageal reflux disease)   . Hypertension   . Overactive bladder   . PONV (postoperative nausea and vomiting)   . Sleep apnea    cpap     Past Surgical History:  Procedure Laterality Date  . CHOLECYSTECTOMY    . COLONOSCOPY    . exploratory vaginal surgery     . LAPAROSCOPIC GASTRIC SLEEVE RESECTION N/A 03/24/2015   Procedure: LAPAROSCOPIC GASTRIC SLEEVE RESECTION UPPER ENDOSCOPY;  Surgeon: Alphonsa Overall, MD;  Location: [facility];  Service: General;  Laterality: N/A;  . LAPAROSCOPY    . pilonidal cyst removal     . UPPER GI ENDOSCOPY  03/24/2015   Procedure: UPPER GI ENDOSCOPY;  Surgeon: Alphonsa Overall, MD;  Location: [facility];  Service: General;;    There were no vitals filed for this visit.  Subjective Assessment - 09/01/17 0819    Subjective  Patient states she is doing a little better since last visit. She has been noticing a popping when she walks although the past two days it has been better.    Pertinent History  genu recurvatum , DM    Limitations  Walking;House hold activities    How long can you sit comfortably?  unlimited    How long can you stand  comfortably?  unlimited    How long can you walk comfortably?  unlimited    Patient Stated Goals  I want to be able to keep up with my 59monthold and 2 other children.     Currently in Pain?  Yes    Pain Score  1     Pain Location  Knee    Pain Orientation  Left    Pain Descriptors / Indicators  Aching    Pain Type  Acute pain    Pain Onset  More than a month ago    Pain Frequency  Intermittent                      [facility] Adult PT Treatment/Exercise - 09/01/17 0001      Knee/Hip Exercises: Stretches   Other Knee/Hip Stretches  sciatic nerve glide 5 sec hold x 10      Knee/Hip Exercises: Aerobic   Stationary Bike  L1 x10 min      Knee/Hip Exercises: Standing   Terminal Knee Extension  Strengthening;Left;10 reps;Theraband;2 sets 5 sec hold    Theraband Level (Terminal Knee Extension)  Level 1 (Yellow)    Step Down  Left;1 set;10 reps  Step Down Limitations  heel taps still has popping    Rocker Board  Other (comment) decline squats x 30      Knee/Hip Exercises: Supine   Straight Leg Raises  Strengthening;Left;1 set 25 reps    Straight Leg Raise with External Rotation  Strengthening;Left;1 set 25 reps    Other Supine Knee/Hip Exercises  nerve glide x 5 with 5 sec hold      Modalities   Modalities  Electrical Stimulation;Cryotherapy      Cryotherapy   Number Minutes Cryotherapy  15 Minutes    Cryotherapy Location  Knee    Type of Cryotherapy  Ice pack      Electrical Stimulation   Electrical Stimulation Location  L ant/post knee    Electrical Stimulation Action  premod    Electrical Stimulation Parameters  80-150 Hz x 15 min    Electrical Stimulation Goals  Pain      Manual Therapy   Manual Therapy  Soft tissue mobilization    Soft tissue mobilization  to L gastroc/soleus/peroneals and popliteus                  PT Long Term Goals - 09/01/17 1111      PT LONG TERM GOAL #1   Title  Pt will improve her left LE knee flexion and extension  strength to grossly 4+/5 in order to improve gait and functional mobility.     Baseline  5/5 with MMT, but functionally she eccentric quad weakness    Time  8    Period  Weeks    Status  Partially Met      PT LONG TERM GOAL #2   Title  Pt will improve her gait to step through gait pattern with equalized step length and heel to toe gait pattern for > 1000 feet on community level surfaces.     Baseline  mild gait deviations including decreased stride length    Time  8    Period  Weeks    Status  On-going      PT LONG TERM GOAL #3   Title  Pt will report no pain with ADL's.     Time  8    Period  Weeks    Status  On-going      PT LONG TERM GOAL #4   Title  Pt will improve her left knee flexion to >/= 120 degrees in order to improve functional mobility.     Baseline  125 deg    Time  8    Period  Weeks    Status  Achieved            Plan - 09/01/17 8588    Clinical Impression Statement  Patient reports she is 90% better overall. She continues to have increased tightness in L gastroc/soleus/peroneals and popliteus. Remaining LTGs are ongoing.    Rehab Potential  Excellent    PT Frequency  2x / week    PT Duration  8 weeks    PT Treatment/Interventions  ADLs/Self Care Home Management;Electrical Stimulation;Ultrasound;Passive range of motion;Neuromuscular re-education;Balance training;Therapeutic exercise;Therapeutic activities;Functional mobility training;Gait training;Stair training;Patient/family education;Manual techniques;Dry needling;Taping    PT Next Visit Plan  Continue with painfree L quad/knee strengthening per MPT POC.    PT Home Exercise Plan  quad sets, SLR, heelslides, wall slides    Consulted and Agree with Plan of Care  Patient       Patient will benefit from skilled therapeutic intervention in order to improve  the following deficits and impairments:  Abnormal gait, Pain, Impaired flexibility  Visit Diagnosis: Acute pain of left knee  Muscle weakness  (generalized)     Problem List Patient Active Problem List   Diagnosis Date Noted  . Morbid obesity ([place]) 03/24/2015    [name] PT 09/01/2017, 11:13 AM  [facility] [address] [place], [place], [address] Phone: [phone]   Fax:  [phone]  Name: SABRIAH HOBBINS MRN: [id number] Date of Birth: 04-26-1976

## 2017-09-08 ENCOUNTER — Ambulatory Visit: Payer: Managed Care, Other (non HMO) | Admitting: Physical Therapy

## 2017-09-08 DIAGNOSIS — M25562 Pain in left knee: Secondary | ICD-10-CM

## 2017-09-08 DIAGNOSIS — M6281 Muscle weakness (generalized): Secondary | ICD-10-CM

## 2017-09-08 DIAGNOSIS — M5442 Lumbago with sciatica, left side: Secondary | ICD-10-CM

## 2017-09-08 NOTE — Therapy (Addendum)
[facility] [place], [place], [address] Phone: [phone]   Fax:  [phone]  Physical Therapy Treatment  Patient Details  Name: [facility]la [facility] MRN: [id number] Date of Birth: 12/26/1975 Referring Provider: Sydnee Cabal, MD   Encounter Date: 09/08/2017  PT End of Session - 09/08/17 0954    Visit Number  8    Number of Visits  16    Date for PT Re-Evaluation  10/06/17    PT Start Time  0946    PT Stop Time  1051    PT Time Calculation (min)  65 min    Activity Tolerance  Patient tolerated treatment well    Behavior During Therapy  [facility] for tasks assessed/performed       Past Medical History:  Diagnosis Date  . Anxiety   . Complication of anesthesia    slow to wake up   . Depression   . Diabetes mellitus without complication ([place])   . GERD (gastroesophageal reflux disease)   . Hypertension   . Overactive bladder   . PONV (postoperative nausea and vomiting)   . Sleep apnea    cpap     Past Surgical History:  Procedure Laterality Date  . CHOLECYSTECTOMY    . COLONOSCOPY    . exploratory vaginal surgery     . LAPAROSCOPIC GASTRIC SLEEVE RESECTION N/A 03/24/2015   Procedure: LAPAROSCOPIC GASTRIC SLEEVE RESECTION UPPER ENDOSCOPY;  Surgeon: Alphonsa Overall, MD;  Location: [facility];  Service: General;  Laterality: N/A;  . LAPAROSCOPY    . pilonidal cyst removal     . UPPER GI ENDOSCOPY  03/24/2015   Procedure: UPPER GI ENDOSCOPY;  Surgeon: Alphonsa Overall, MD;  Location: [facility];  Service: General;;    There were no vitals filed for this visit.  Subjective Assessment - 09/08/17 0955    Subjective  Patient states that on Tuesday she was achy in her left knee and lower leg and then noticed her back was hurting. She would like to know if the knee/leg pain is related to her back. She used meds and massage and nothing helped.    Patient Stated Goals  I want to be able to keep up with my 3monthold and 2 other children.      Currently in Pain?  Yes    Pain Score  2     Pain Location  Calf    Pain Orientation  Left    Pain Descriptors / Indicators  Aching    Pain Type  Acute pain         [facility] PT Assessment - 09/08/17 0001      Posture/Postural Control   Posture/Postural Control  Postural limitations    Postural Limitations  Decreased lumbar lordosis;Anterior pelvic tilt    Posture Comments  even pelvic landmarks in supine and standing      ROM / Strength   AROM / PROM / Strength  AROM;Strength      AROM   Overall AROM Comments  lumbar WNL; pt hypermobile in all planes      Strength   Overall Strength Comments  hip ext 5/5    Left Knee Flexion  5/5 Bil      Palpation   Spinal mobility  hypermobile in lumbar spine; pain with PA mobs gd I/II to L4/5; L5/S1 Bil    Palpation comment  tender in bil gluteals along iliac crest      Special Tests    Special Tests  Lumbar  Lumbar Tests  Straight Leg Raise      Straight Leg Raise   Findings  Positive    Side   Left                  [facility] Adult PT Treatment/Exercise - 09/08/17 0001      Knee/Hip Exercises: Aerobic   Stationary Bike  L6 x 13      Knee/Hip Exercises: Supine   Bridges  Strengthening;Both;1 set;5 reps;Limitations    Bridges Limitations  unable to stabilize with alternating knee ext    Other Supine Knee/Hip Exercises  nerve glide x 5 in sitting (not as effective as supine so pt to continue with supine)      Knee/Hip Exercises: Prone   Other Prone Exercises  pelvic press series: press 5sec x 5; with unilateral and bil knee bends x 10 each; with hip extension and mule kick x 10 ea      Modalities   Modalities  Teacher, English as a foreign language Location  L ant/post knee    Electrical Stimulation Action  premod    Electrical Stimulation Parameters  80-150 Hz x 15 min    Electrical Stimulation Goals  Pain      Manual Therapy   Manual Therapy  Myofascial release;Soft tissue  mobilization    Soft tissue mobilization  to L popliteus    Myofascial Release  xfriction to tendon attachments at left lat condyle                  PT Long Term Goals - 09/08/17 0959      PT LONG TERM GOAL #1   Title  Pt will improve her left LE knee flexion and extension strength to grossly 4+/5 in order to improve gait and functional mobility.     Baseline  5/5 with MMT, but functionally she eccentric quad weakness    Time  8    Period  Weeks    Status  Partially Met      PT LONG TERM GOAL #2   Title  Pt will improve her gait to step through gait pattern with equalized step length and heel to toe gait pattern for > 1000 feet on community level surfaces.     Baseline  mild gait deviations including decreased stride length    Time  8    Period  Weeks    Status  On-going      PT LONG TERM GOAL #3   Title  Pt will report no pain with ADL's.     Time  8    Period  Weeks    Status  On-going      PT LONG TERM GOAL #4   Title  Pt will improve her left knee flexion to >/= 120 degrees in order to improve functional mobility.     Baseline  125 deg    Time  8    Period  Weeks    Status  Achieved      PT LONG TERM GOAL #5   Title  Patient to report decreased low back pain and radicular pain by 75% with ADLS.    Time  2    Period  Weeks    Status  New    Target Date  10/06/17            Plan - 09/08/17 1139    Clinical Impression Statement  Patient presents today with concerns regarding  her back and lower leg pain. She reports 90% overall improvement in her left knee, however she continues to have pain in the post knee and down her lateral lower leg. Additionally she has intermittent low back pain. Assessment of her low back indicates that she has sciatic/peroneal nerve impingement and lumbar instability. She is hypermobile in the lumbar spine and has pain with grade I/II PA mobs bil. She also has a + SLR on the L. Patient will benefit from further PT  to address  these issues which may also be contributing to her left knee pain. She is tender along L popliteus and at insertion points. She has full strength in left knee but still has some eccentric weakness in the quads and intermittent distal quad pain.    History and Personal Factors relevant to plan of care:  joint laxity    Rehab Potential  Excellent    PT Frequency  2x / week    PT Duration  4 weeks    PT Treatment/Interventions  ADLs/Self Care Home Management;Electrical Stimulation;Ultrasound;Passive range of motion;Neuromuscular re-education;Balance training;Therapeutic exercise;Therapeutic activities;Functional mobility training;Gait training;Stair training;Patient/family education;Manual techniques;Dry needling;Taping;Cryotherapy;Moist Heat;Traction    PT Next Visit Plan  If cert signed, add lumbar stabilization; Continue with painfree L quad/knee strengthening per MPT POC.    PT Home Exercise Plan  quad sets, SLR, heelslides, wall slides, pelvic press series, briding    Consulted and Agree with Plan of Care  Patient       Patient will benefit from skilled therapeutic intervention in order to improve the following deficits and impairments:  Abnormal gait, Pain, Impaired flexibility, Hypermobility  Visit Diagnosis: Acute pain of left knee - Plan: PT plan of care cert/re-cert  Muscle weakness (generalized) - Plan: PT plan of care cert/re-cert  Acute left-sided low back pain with left-sided sciatica - Plan: PT plan of care cert/re-cert     Problem List Patient Active Problem List   Diagnosis Date Noted  . Morbid obesity ([place]) 03/24/2015    [name] PT 09/08/2017, 12:11 PM  [facility] [address] [place], [place], [address] Phone: [phone]   Fax:  [phone]  Name: Carla Rogers MRN: [id number] Date of Birth: 1976-03-05  PHYSICAL THERAPY DISCHARGE SUMMARY  Visits from Start of Care: 8.  Current functional level related to  goals / functional outcomes: See above.   Remaining deficits: See goal section.   Education / Equipment: HEP. Plan: Patient agrees to discharge.  Patient goals were partially met. Patient is being discharged due to not returning since the last visit.  ?????         Mali Applegate MPT

## 2017-09-08 NOTE — Patient Instructions (Signed)
Pelvic Press    Place hands under belly between navel and pubic bone, palms up. Feel pressure on hands. Increase pressure on hands by pressing pelvis down. This is NOT a pelvic tilt. Hold __5_ seconds. Relax. Repeat _10__ times. Once a day.  KNEE: Flexion - Prone   Hold pelvic press. Bend knee. Raise heel toward buttocks. Repeat on opposite leg. Do not raise hips. _10__ reps per set. When this is mastered, pull both heels up at same time, x 10 reps.  Once a day   Hip Extension (Prone)  Hold pelvic press  Lift left leg _3___ inches from floor, keeping knee locked. Repeat __10__ times per set. Do _1___ sets per session. Do _10___ sessions per day.  HIP: Extension / KNEE: Flexion - Prone    Hold pelvic press. Bend knee, squeeze glutes. Raise leg up  10___ reps per set, _2__ sets per day, _5__ days per week    Bridge Pose   Elevate hips as high as possible. Focus on engaging posterior hip muscles. Hold for _10 seconds. Repeat _20___ times. 1-2 times per day.   Carla PalmJulie Andrey Hoobler, PT 09/08/17 10:28 AM; [facility] [address] [place], [place], [id number] Phone: [phone]   Fax:  [phone]

## 2017-11-22 ENCOUNTER — Ambulatory Visit: Payer: Managed Care, Other (non HMO) | Attending: Specialist | Admitting: Physical Therapy

## 2017-11-22 ENCOUNTER — Other Ambulatory Visit: Payer: Self-pay

## 2017-11-22 DIAGNOSIS — R600 Localized edema: Secondary | ICD-10-CM | POA: Diagnosis present

## 2017-11-22 DIAGNOSIS — M25562 Pain in left knee: Secondary | ICD-10-CM | POA: Insufficient documentation

## 2017-11-22 DIAGNOSIS — G8929 Other chronic pain: Secondary | ICD-10-CM | POA: Diagnosis present

## 2017-11-22 NOTE — Therapy (Signed)
[facility] [address] [place], [place], [address] Phone: [phone]   Fax:  [phone]  Physical Therapy Evaluation  Patient Details  Name: Carla Rogers MRN: [id number] Date of Birth: 05/30/1976 Referring Provider: Eugenia Mcalpineobert Collins MD   Encounter Date: 11/22/2017  PT End of Session - 11/22/17 1208    Visit Number  1    Number of Visits  12    Date for PT Re-Evaluation  12/20/17    Authorization Type  FOTO AT LEAST EVERY 5TH VISIT AND 10TH VISIT PROGRESS NOTE.    PT Start Time  [phone]    PT Stop Time  1045    PT Time Calculation (min)  52 min    Activity Tolerance  Patient tolerated treatment well    Behavior During Therapy  WFL for tasks assessed/performed       Past Medical History:  Diagnosis Date  . Anxiety   . Complication of anesthesia    slow to wake up   . Depression   . Diabetes mellitus without complication (HCC)   . GERD (gastroesophageal reflux disease)   . Hypertension   . Overactive bladder   . PONV (postoperative nausea and vomiting)   . Sleep apnea    cpap     Past Surgical History:  Procedure Laterality Date  . CHOLECYSTECTOMY    . COLONOSCOPY    . exploratory vaginal surgery     . LAPAROSCOPIC GASTRIC SLEEVE RESECTION N/A 03/24/2015   Procedure: LAPAROSCOPIC GASTRIC SLEEVE RESECTION UPPER ENDOSCOPY;  Surgeon: Ovidio Kinavid Newman, MD;  Location: [facility];  Service: General;  Laterality: N/A;  . LAPAROSCOPY    . pilonidal cyst removal     . UPPER GI ENDOSCOPY  03/24/2015   Procedure: UPPER GI ENDOSCOPY;  Surgeon: Ovidio Kinavid Newman, MD;  Location: [facility];  Service: General;;    There were no vitals filed for this visit.   Subjective Assessment - 11/22/17 1210    Subjective  The patient underwent her second left knee surgery on 0n 11/14/17.  her pain-level at rest is a 2/10.  She feels pain in the back of her left knee. Increased activity increases her pain.    Pertinent History  genu recurvatum , DM    Limitations   Walking;House hold activities    Patient Stated Goals  Get back to normal life.    Currently in Pain?  Yes    Pain Score  2     Pain Location  Knee    Pain Orientation  Left    Pain Type  Acute pain    Pain Onset  More than a month ago         [facility] PT [facility] - 11/22/17 0001      Assessment   Medical Diagnosis  Left knee scope.    Referring Provider  Eugenia Mcalpineobert Collins MD    Onset Date/Surgical Date  -- 11/14/17 (surgery date).      Precautions   Precautions  -- PAIN-FREE LEFT KNEE EXERCISES.      Restrictions   Weight Bearing Restrictions  No      Balance Screen   Has the patient fallen in the past 6 months  No    Has the patient had a decrease in activity level because of a fear of falling?   No    Is the patient reluctant to leave their home because of a fear of falling?   No      Prior Function   Level  of Independence  Independent      Observation/Other Assessments   Observations  Bandaids covering left knee scope sites with mild associated bruising.    Focus on Therapeutic Outcomes (FOTO)   74% limitation.      Observation/Other Assessments-Edema    Edema  -- LT mid-patellar circumference 4 cms > right.      AROM   Overall AROM Comments  Full left knee extension and flexion to 120 degrees.      Strength   Overall Strength Comments  Left hip knee 4+ to 5-/5.      Palpation   Palpation comment  Mild tenderness over left knee medial joint line though CC is in the area of the popliteal fossa referring into her calf.      Ambulation/Gait   Gait Comments  Decreased stance time over left LE.                Objective measurements completed on examination: See above findings.      [facility] Adult PT Treatment/Exercise - 11/22/17 0001      Electrical Stimulation   Electrical Stimulation Location  Left knee.    Electrical Stimulation Action  IFC    Electrical Stimulation Parameters  1-10 Hz x 20 minutes.    Electrical Stimulation Goals  Pain       Vasopneumatic   Number Minutes Vasopneumatic   20 minutes    Vasopnuematic Location   -- Left knee.    Vasopneumatic Pressure  Medium                  PT Long Term Goals - 11/22/17 1236      PT LONG TERM GOAL #1   Title  Ind with a HEP.    Time  4    Period  Weeks    Status  New      PT LONG TERM GOAL #2   Title  Pt will improve her gait to step through gait pattern with equalized step length and heel to toe gait pattern for > 1000 feet on community level surfaces.     Time  4    Period  Weeks    Status  New      PT LONG TERM GOAL #3   Title  Pt will report no pain with ADL's.     Time  4    Period  Weeks    Status  New      PT LONG TERM GOAL #4   Title  Perform a reciprocating stair gait with one railing with pain not > 2-3/10.    Time  4    Period  Weeks    Status  New             Plan - 11/22/17 1230    Clinical Impression Statement  The patient presents to OPPT s/p left knee scope performed on 11/14/17.  She had cartilage removed and several areas of arthritis debrided.  Her range of motion is nearly full with only a mild loss of strength.  She has a moderate amount of edema currently.  Her CC is pain in her left popliteal fossa referring into her left calf musculature.  She demonstrates decreased stance time over her left LE.  Additionally, she was found to have lumbar DDD that has flared up a bit due to her altered gait .Patient will benefit from skilled physical therapy intervention.    Clinical Presentation  Stable    Clinical Presentation due  to:  Good surgical outcome.    Clinical Decision Making  Low    Rehab Potential  Excellent    PT Frequency  2x / week    PT Duration  4 weeks    PT Treatment/Interventions  ADLs/Self Care Home Management;Cryotherapy;Neurosurgeon;Therapeutic activities;Therapeutic exercise;Neuromuscular re-education;Patient/family education;Passive range of motion;Manual  techniques;Vasopneumatic Device    PT Next Visit Plan  PAIN-FREE LEFT KNEE EXERCISES; VASO AND E' STIM.    Consulted and Agree with Plan of Care  Patient       Patient will benefit from skilled therapeutic intervention in order to improve the following deficits and impairments:  Abnormal gait, Pain, Increased edema, Decreased strength  Visit Diagnosis: Chronic pain of left knee - Plan: PT plan of care cert/re-cert  Localized edema - Plan: PT plan of care cert/re-cert     Problem List Patient Active Problem List   Diagnosis Date Noted  . Morbid obesity (HCC) 03/24/2015    [place], [place] MPT 11/22/2017, 12:51 PM  [facility] [address] [place], [place], [address] Phone: [phone]   Fax:  [phone]  Name: Carla Rogers MRN: [id number] Date of Birth: 10/27/1975

## 2017-11-27 ENCOUNTER — Ambulatory Visit: Payer: Managed Care, Other (non HMO) | Admitting: Physical Therapy

## 2017-11-27 ENCOUNTER — Encounter: Payer: Self-pay | Admitting: Physical Therapy

## 2017-11-27 DIAGNOSIS — M25562 Pain in left knee: Secondary | ICD-10-CM | POA: Diagnosis not present

## 2017-11-27 DIAGNOSIS — R600 Localized edema: Secondary | ICD-10-CM

## 2017-11-27 DIAGNOSIS — G8929 Other chronic pain: Secondary | ICD-10-CM

## 2017-11-27 NOTE — Therapy (Signed)
[facility] [address] [place], [place], [address] Phone: [phone]   Fax:  [phone]  Physical Therapy Treatment  Patient Details  Name: Carla Rogers MRN: [id number] Date of Birth: 04/16/1976 Referring Provider: Eugenia Mcalpine MD   Encounter Date: 11/27/2017  PT End of Session - 11/27/17 1520    Visit Number  2    Number of Visits  12    Date for PT Re-Evaluation  12/20/17    Authorization Type  FOTO AT LEAST EVERY 5TH VISIT AND 10TH VISIT PROGRESS NOTE.    PT Start Time  1517    PT Stop Time  1608    PT Time Calculation (min)  51 min    Activity Tolerance  Patient tolerated treatment well    Behavior During Therapy  WFL for tasks assessed/performed       Past Medical History:  Diagnosis Date  . Anxiety   . Complication of anesthesia    slow to wake up   . Depression   . Diabetes mellitus without complication (HCC)   . GERD (gastroesophageal reflux disease)   . Hypertension   . Overactive bladder   . PONV (postoperative nausea and vomiting)   . Sleep apnea    cpap     Past Surgical History:  Procedure Laterality Date  . CHOLECYSTECTOMY    . COLONOSCOPY    . exploratory vaginal surgery     . LAPAROSCOPIC GASTRIC SLEEVE RESECTION N/A 03/24/2015   Procedure: LAPAROSCOPIC GASTRIC SLEEVE RESECTION UPPER ENDOSCOPY;  Surgeon: Ovidio Kin, MD;  Location: [facility];  Service: General;  Laterality: N/A;  . LAPAROSCOPY    . pilonidal cyst removal     . UPPER GI ENDOSCOPY  03/24/2015   Procedure: UPPER GI ENDOSCOPY;  Surgeon: Ovidio Kin, MD;  Location: [facility];  Service: General;;    There were no vitals filed for this visit.  Subjective Assessment - 11/27/17 1518    Subjective  Reports that her L knee is tight today and reports sensitivity to the touch of her pants leg. Reports that she thinks she may be doing too much.    Pertinent History  genu recurvatum , DM    Limitations  Walking;House hold activities    Patient  Stated Goals  Get back to normal life.    Currently in Pain?  Yes    Pain Score  5     Pain Location  Knee    Pain Orientation  Left    Pain Descriptors / Indicators  Discomfort    Pain Type  Acute pain    Pain Onset  More than a month ago         [facility] PT Assessment - 11/27/17 0001      Assessment   Medical Diagnosis  Left knee scope.    Onset Date/Surgical Date  11/14/17    Hand Dominance  Right    Next MD Visit  11/2017      Restrictions   Weight Bearing Restrictions  No                   [facility] Adult PT Treatment/Exercise - 11/27/17 0001      Exercises   Exercises  Knee/Hip      Knee/Hip Exercises: Stretches   Passive Hamstring Stretch  Left;3 reps;30 seconds    Passive Hamstring Stretch Limitations  with belt    ITB Stretch  Left;3 reps;30 seconds    ITB Stretch Limitations  with belt  Knee/Hip Exercises: Aerobic   Nustep  L5 x10 min      Knee/Hip Exercises: Seated   Long Arc Quad  Strengthening;Left;20 reps;Weights    Long Arc Quad Weight  3 lbs.      Knee/Hip Exercises: Supine   Straight Leg Raises  AROM;Left;20 reps    Straight Leg Raise with External Rotation  AROM;Left;20 reps      Modalities   Modalities  Vasopneumatic      Vasopneumatic   Number Minutes Vasopneumatic   15 minutes    Vasopnuematic Location   Knee    Vasopneumatic Pressure  Low    Vasopneumatic Temperature   34                  PT Long Term Goals - 11/22/17 1236      PT LONG TERM GOAL #1   Title  Ind with a HEP.    Time  4    Period  Weeks    Status  New      PT LONG TERM GOAL #2   Title  Pt will improve her gait to step through gait pattern with equalized step length and heel to toe gait pattern for > 1000 feet on community level surfaces.     Time  4    Period  Weeks    Status  New      PT LONG TERM GOAL #3   Title  Pt will report no pain with ADL's.     Time  4    Period  Weeks    Status  New      PT LONG TERM GOAL #4   Title  Perform  a reciprocating stair gait with one railing with pain not > 2-3/10.    Time  4    Period  Weeks    Status  New            Plan - 11/27/17 1614    Clinical Impression Statement  Patient presented in clinic with reports of mid level L knee pain but may be from busy weekend and having other kids at her home this weekend. Patient guided through L knee CKC and [place] quad strengthening exercises without complaint of pain. Patient did report mid and lateral HS area tightness upon standing following Nustep. L HS and ITB stretch were completed with belt without complaint of pain. Nodules were palpated in inferior HS region with nodules which was referred to either TPs or muscle. Patient educated to assess the HS nodules or tightness until next PT treatment. Normal vasopnuematic response noted following removal of the modalities.    Rehab Potential  Excellent    PT Frequency  2x / week    PT Duration  4 weeks    PT Treatment/Interventions  ADLs/Self Care Home Management;Cryotherapy;Neurosurgeon;Therapeutic activities;Therapeutic exercise;Neuromuscular re-education;Patient/family education;Passive range of motion;Manual techniques;Vasopneumatic Device    PT Next Visit Plan  PAIN-FREE LEFT KNEE EXERCISES; VASO AND E' STIM.    Consulted and Agree with Plan of Care  Patient       Patient will benefit from skilled therapeutic intervention in order to improve the following deficits and impairments:  Abnormal gait, Pain, Increased edema, Decreased strength  Visit Diagnosis: Chronic pain of left knee  Localized edema     Problem List Patient Active Problem List   Diagnosis Date Noted  . Morbid obesity (HCC) 03/24/2015    Marvell Fuller, PTA 11/27/2017, 4:19 PM  [facility]  [address] [place], [place], [address] Phone: [phone]   Fax:  [phone]  Name: Carla Rogers MRN: [id number] Date of Birth:  1976/07/27

## 2017-11-30 ENCOUNTER — Ambulatory Visit: Payer: Managed Care, Other (non HMO) | Attending: Specialist | Admitting: Physical Therapy

## 2017-11-30 ENCOUNTER — Encounter: Payer: Self-pay | Admitting: Physical Therapy

## 2017-11-30 DIAGNOSIS — R600 Localized edema: Secondary | ICD-10-CM | POA: Diagnosis present

## 2017-11-30 DIAGNOSIS — M6281 Muscle weakness (generalized): Secondary | ICD-10-CM | POA: Diagnosis present

## 2017-11-30 DIAGNOSIS — G8929 Other chronic pain: Secondary | ICD-10-CM

## 2017-11-30 DIAGNOSIS — M25562 Pain in left knee: Secondary | ICD-10-CM | POA: Insufficient documentation

## 2017-11-30 NOTE — Therapy (Signed)
[facility] [address] [place], [place], [address] Phone: [phone]   Fax:  [phone]  Physical Therapy Treatment  Patient Details  Name: Carla Rogers MRN: [id number] Date of Birth: January 19, 1976 Referring Provider: Eugenia Mcalpine MD   Encounter Date: 11/30/2017  PT End of Session - 11/30/17 1408    Visit Number  3    Number of Visits  12    Date for PT Re-Evaluation  12/20/17    Authorization Type  FOTO AT LEAST EVERY 5TH VISIT AND 10TH VISIT PROGRESS NOTE.    PT Start Time  1346    PT Stop Time  1426    PT Time Calculation (min)  40 min    Activity Tolerance  Patient tolerated treatment well    Behavior During Therapy  WFL for tasks assessed/performed       Past Medical History:  Diagnosis Date  . Anxiety   . Complication of anesthesia    slow to wake up   . Depression   . Diabetes mellitus without complication (HCC)   . GERD (gastroesophageal reflux disease)   . Hypertension   . Overactive bladder   . PONV (postoperative nausea and vomiting)   . Sleep apnea    cpap     Past Surgical History:  Procedure Laterality Date  . CHOLECYSTECTOMY    . COLONOSCOPY    . exploratory vaginal surgery     . LAPAROSCOPIC GASTRIC SLEEVE RESECTION N/A 03/24/2015   Procedure: LAPAROSCOPIC GASTRIC SLEEVE RESECTION UPPER ENDOSCOPY;  Surgeon: Ovidio Kin, MD;  Location: [facility];  Service: General;  Laterality: N/A;  . LAPAROSCOPY    . pilonidal cyst removal     . UPPER GI ENDOSCOPY  03/24/2015   Procedure: UPPER GI ENDOSCOPY;  Surgeon: Ovidio Kin, MD;  Location: [facility];  Service: General;;    There were no vitals filed for this visit.  Subjective Assessment - 11/30/17 1346    Subjective  Patient arrived with no pain and feels some progress    Pertinent History  genu recurvatum , DM    Limitations  Walking;House hold activities    Patient Stated Goals  Get back to normal life.    Currently in Pain?  No/denies                        [facility] Adult PT Treatment/Exercise - 11/30/17 0001      Knee/Hip Exercises: Aerobic   Nustep  L5 x10 min      Knee/Hip Exercises: Seated   Long Arc Quad  Strengthening;Left;Weights;10 reps;2 sets    [employer] Weight  3 lbs.      Knee/Hip Exercises: Supine   Short Arc Quad Sets  Strengthening;Left;3 sets;10 reps;Other (comment);Limitations    Short Arc Quad Sets Limitations  3#    Bridges  Strengthening;Both;20 reps    Straight Leg Raises  AROM;Left;20 reps    Straight Leg Raise with External Rotation  AROM;Left;20 reps      Knee/Hip Exercises: Sidelying   Hip ABduction  Strengthening;Left;20 reps                  PT Long Term Goals - 11/30/17 1414      PT LONG TERM GOAL #1   Title  Ind with a HEP.    Baseline  5/5 with MMT, but functionally she eccentric quad weakness    Time  4    Period  Weeks    Status  On-going      PT LONG TERM GOAL #2   Title  Pt will improve her gait to step through gait pattern with equalized step length and heel to toe gait pattern for > 1000 feet on community level surfaces.     Baseline  mild gait deviations including decreased stride length    Time  4    Period  Weeks    Status  On-going      PT LONG TERM GOAL #3   Title  Pt will report no pain with ADL's.     Time  4    Period  Weeks    Status  On-going      PT LONG TERM GOAL #4   Title  Perform a reciprocating stair gait with one railing with pain not > 2-3/10.    Baseline  125 deg    Time  4    Period  Weeks    Status  On-going            Plan - 11/30/17 1409    Clinical Impression Statement  Patient tolerated treatment well today. Patient had no pain pre or post treatment today. Patient feels improvement per reported. Patient able to progress with exercises today with low level exercises. Patient progressing toward goals.     Rehab Potential  Excellent    PT Frequency  2x / week    PT Duration  4 weeks    PT Next Visit  Plan  PAIN-FREE LEFT KNEE EXERCISES; VASO AND E' STIM.    Consulted and Agree with Plan of Care  Patient       Patient will benefit from skilled therapeutic intervention in order to improve the following deficits and impairments:  Abnormal gait, Pain, Increased edema, Decreased strength  Visit Diagnosis: Chronic pain of left knee  Localized edema     Problem List Patient Active Problem List   Diagnosis Date Noted  . Morbid obesity (HCC) 03/24/2015    Australia Droll P, PTA 11/30/2017, 2:26 PM  [facility] [address] [place], [place], [address] Phone: [phone]   Fax:  [phone]  Name: Carla Rogers MRN: [id number] Date of Birth: [birth year]

## 2017-12-04 ENCOUNTER — Ambulatory Visit: Payer: Managed Care, Other (non HMO) | Admitting: Physical Therapy

## 2017-12-04 ENCOUNTER — Encounter: Payer: Self-pay | Admitting: Physical Therapy

## 2017-12-04 DIAGNOSIS — M25562 Pain in left knee: Secondary | ICD-10-CM | POA: Diagnosis not present

## 2017-12-04 DIAGNOSIS — R600 Localized edema: Secondary | ICD-10-CM

## 2017-12-04 DIAGNOSIS — G8929 Other chronic pain: Secondary | ICD-10-CM

## 2017-12-04 NOTE — Therapy (Signed)
[facility] [address] [place], [place], [address] Phone: [phone]   Fax:  [phone]  Physical Therapy Treatment  Patient Details  Name: Carla Rogers MRN: [id number] Date of Birth: 1976/07/08 Referring Provider: Eugenia Mcalpine MD   Encounter Date: 12/04/2017  PT End of Session - 12/04/17 0937    Visit Number  4    Number of Visits  12    Date for PT Re-Evaluation  12/20/17    Authorization Type  FOTO AT LEAST EVERY 5TH VISIT AND 10TH VISIT PROGRESS NOTE.    PT Start Time  0901    PT Stop Time  0957    PT Time Calculation (min)  56 min    Activity Tolerance  Patient tolerated treatment well    Behavior During Therapy  WFL for tasks assessed/performed       Past Medical History:  Diagnosis Date  . Anxiety   . Complication of anesthesia    slow to wake up   . Depression   . Diabetes mellitus without complication (HCC)   . GERD (gastroesophageal reflux disease)   . Hypertension   . Overactive bladder   . PONV (postoperative nausea and vomiting)   . Sleep apnea    cpap     Past Surgical History:  Procedure Laterality Date  . CHOLECYSTECTOMY    . COLONOSCOPY    . exploratory vaginal surgery     . LAPAROSCOPIC GASTRIC SLEEVE RESECTION N/A 03/24/2015   Procedure: LAPAROSCOPIC GASTRIC SLEEVE RESECTION UPPER ENDOSCOPY;  Surgeon: Ovidio Kin, MD;  Location: [facility];  Service: General;  Laterality: N/A;  . LAPAROSCOPY    . pilonidal cyst removal     . UPPER GI ENDOSCOPY  03/24/2015   Procedure: UPPER GI ENDOSCOPY;  Surgeon: Ovidio Kin, MD;  Location: [facility];  Service: General;;    There were no vitals filed for this visit.  Subjective Assessment - 12/04/17 0902    Subjective  Patient arrived with no pain and feels some ongoing progress    Pertinent History  genu recurvatum , DM    Limitations  Walking;House hold activities    Patient Stated Goals  Get back to normal life.    Currently in Pain?  No/denies                        [facility] Adult PT Treatment/Exercise - 12/04/17 0001      Knee/Hip Exercises: Aerobic   Nustep  L 4-5 x13 min UE/LE activity      Knee/Hip Exercises: Standing   Terminal Knee Extension  Strengthening;Left;20 reps;Theraband;Other (comment);Limitations;10 reps    Theraband Level (Terminal Knee Extension)  Other (comment)    Terminal Knee Extension Limitations  pink XTS      Knee/Hip Exercises: Seated   Long Arc Quad  Strengthening;Left;Weights;10 reps;3 sets    Long Arc Quad Weight  3 lbs.      Knee/Hip Exercises: Supine   Short Arc Quad Sets  Strengthening;Left;3 sets;10 reps;Other (comment);Limitations    Short Arc Quad Sets Limitations  3#    Bridges with [employer]  Strengthening;Both;20 reps    Straight Leg Raises  AROM;Left;20 reps    Straight Leg Raise with External Rotation  AROM;Left;20 reps    Other Supine Knee/Hip Exercises  hip abd with red t-band x30      Knee/Hip Exercises: Sidelying   Hip ABduction  Strengthening;Left;20 reps      Vasopneumatic   Number Minutes Vasopneumatic  15 minutes    Vasopnuematic Location   Knee    Vasopneumatic Pressure  Low                  PT Long Term Goals - 11/30/17 1414      PT LONG TERM GOAL #1   Title  Ind with a HEP.    Baseline  5/5 with MMT, but functionally she eccentric quad weakness    Time  4    Period  Weeks    Status  On-going      PT LONG TERM GOAL #2   Title  Pt will improve her gait to step through gait pattern with equalized step length and heel to toe gait pattern for > 1000 feet on community level surfaces.     Baseline  mild gait deviations including decreased stride length    Time  4    Period  Weeks    Status  On-going      PT LONG TERM GOAL #3   Title  Pt will report no pain with ADL's.     Time  4    Period  Weeks    Status  On-going      PT LONG TERM GOAL #4   Title  Perform a reciprocating stair gait with one railing with pain not > 2-3/10.     Baseline  125 deg    Time  4    Period  Weeks    Status  On-going            Plan - 12/04/17 0945    Clinical Impression Statement  Patient tolerated treatment well today and able to progress left knee strengthening. Patient has been doing well with therapy and exercises with no reported pain. Patient unable to perform stairs at this time due to discomfort. Will progress with pain free exercises to stabilize and strengthen knee. Goals ongoing.     Rehab Potential  Excellent    PT Frequency  2x / week    PT Duration  4 weeks    PT Treatment/Interventions  ADLs/Self Care Home Management;Cryotherapy;Neurosurgeon;Therapeutic activities;Therapeutic exercise;Neuromuscular re-education;Patient/family education;Passive range of motion;Manual techniques;Vasopneumatic Device    PT Next Visit Plan  PAIN-FREE LEFT KNEE EXERCISES; VASO AND E' STIM. consider wall sits    Consulted and Agree with Plan of Care  Patient       Patient will benefit from skilled therapeutic intervention in order to improve the following deficits and impairments:  Abnormal gait, Pain, Increased edema, Decreased strength  Visit Diagnosis: Chronic pain of left knee  Localized edema     Problem List Patient Active Problem List   Diagnosis Date Noted  . Morbid obesity (HCC) 03/24/2015    DUNFORD, CHRISTINA P, PTA 12/04/2017, 10:00 AM  [facility] [address] [place], [place], [address] Phone: [phone]   Fax:  [phone]  Name: Carla Rogers MRN: [id number] Date of Birth: 1975-11-28

## 2017-12-07 ENCOUNTER — Encounter: Payer: Self-pay | Admitting: Physical Therapy

## 2017-12-07 ENCOUNTER — Ambulatory Visit: Payer: Managed Care, Other (non HMO) | Admitting: Physical Therapy

## 2017-12-07 DIAGNOSIS — M25562 Pain in left knee: Secondary | ICD-10-CM | POA: Diagnosis not present

## 2017-12-07 DIAGNOSIS — G8929 Other chronic pain: Secondary | ICD-10-CM

## 2017-12-07 DIAGNOSIS — R600 Localized edema: Secondary | ICD-10-CM

## 2017-12-07 NOTE — Therapy (Signed)
[facility] [address] [place], [place], [address] Phone: [phone]   Fax:  [phone]  Physical Therapy Treatment  Patient Details  Name: Carla Rogers MRN: [id number] Date of Birth: Jul 21, 1976 Referring Provider: Eugenia Mcalpine MD   Encounter Date: 12/07/2017  PT End of Session - 12/07/17 1349    Visit Number  5    Number of Visits  12    Date for PT Re-Evaluation  12/20/17    Authorization Type  FOTO AT LEAST EVERY 5TH VISIT AND 10TH VISIT PROGRESS NOTE.    PT Start Time  1347    Activity Tolerance  Patient tolerated treatment well    Behavior During Therapy  [facility] for tasks assessed/performed       Past Medical History:  Diagnosis Date  . Anxiety   . Complication of anesthesia    slow to wake up   . Depression   . Diabetes mellitus without complication (HCC)   . GERD (gastroesophageal reflux disease)   . Hypertension   . Overactive bladder   . PONV (postoperative nausea and vomiting)   . Sleep apnea    cpap     Past Surgical History:  Procedure Laterality Date  . CHOLECYSTECTOMY    . COLONOSCOPY    . exploratory vaginal surgery     . LAPAROSCOPIC GASTRIC SLEEVE RESECTION N/A 03/24/2015   Procedure: LAPAROSCOPIC GASTRIC SLEEVE RESECTION UPPER ENDOSCOPY;  Surgeon: Ovidio Kin, MD;  Location: [facility];  Service: General;  Laterality: N/A;  . LAPAROSCOPY    . pilonidal cyst removal     . UPPER GI ENDOSCOPY  03/24/2015   Procedure: UPPER GI ENDOSCOPY;  Surgeon: Ovidio Kin, MD;  Location: [facility];  Service: General;;    There were no vitals filed for this visit.  Subjective Assessment - 12/07/17 1348    Subjective  Reports that she feels like she is finaly turning a corner and feels like she is walking the most normal she has in a while. Reports only intermittantly as in when she turns or squats to tie her son's shoes she feels a twinge of pain.     Pertinent History  genu recurvatum , DM    Limitations  Walking;House  hold activities    Patient Stated Goals  Get back to normal life.    Currently in Pain?  No/denies         [facility] PT Assessment - 12/07/17 0001      Assessment   Medical Diagnosis  Left knee scope.    Onset Date/Surgical Date  11/14/17    Hand Dominance  Right    Next MD Visit  11/2017      Restrictions   Weight Bearing Restrictions  No                   [facility] Adult PT Treatment/Exercise - 12/07/17 0001      Knee/Hip Exercises: Aerobic   Stationary Bike  L3 x10 min      Knee/Hip Exercises: Standing   Terminal Knee Extension  Strengthening;Left;20 reps;Theraband;Other (comment);Limitations;10 reps    Theraband Level (Terminal Knee Extension)  Other (comment)    Terminal Knee Extension Limitations  pink XTS    Hip Abduction  AROM;Both;20 reps;Knee straight    Lateral Step Up  Right;20 reps;Hand Hold: 2;Step Height: 4"    Forward Step Up  Right;20 reps;Hand Hold: 2;Step Height: 4"      Knee/Hip Exercises: Seated   Sit to Sand  20 reps;without  UE support      Knee/Hip Exercises: Supine   Straight Leg Raises  AROM;Left;20 reps    Straight Leg Raise with External Rotation  AROM;Left;20 reps      Modalities   Modalities  Vasopneumatic      Vasopneumatic   Number Minutes Vasopneumatic   15 minutes    Vasopnuematic Location   Knee    Vasopneumatic Pressure  Medium    Vasopneumatic Temperature   34                  PT Long Term Goals - 11/30/17 1414      PT LONG TERM GOAL #1   Title  Ind with a HEP.    Baseline  5/5 with MMT, but functionally she eccentric quad weakness    Time  4    Period  Weeks    Status  On-going      PT LONG TERM GOAL #2   Title  Pt will improve her gait to step through gait pattern with equalized step length and heel to toe gait pattern for > 1000 feet on community level surfaces.     Baseline  mild gait deviations including decreased stride length    Time  4    Period  Weeks    Status  On-going      PT LONG TERM GOAL  #3   Title  Pt will report no pain with ADL's.     Time  4    Period  Weeks    Status  On-going      PT LONG TERM GOAL #4   Title  Perform a reciprocating stair gait with one railing with pain not > 2-3/10.    Baseline  125 deg    Time  4    Period  Weeks    Status  On-going            Plan - 12/07/17 1422    Clinical Impression Statement  Patient tolerated today's treatment well with minimal intermittant L knee pain. Patient reported a minimal L knee pain with forward step ups to 4" step. No complaints with sit to stands or lateral step ups to 4" step. No complaints from patient with LLE SLS for R hip abduction. Normal vasopneumatic response noted following removal of the modality. Goals remain on-going at this time for intermittant pain and defciits with gait as well as stairs.    Rehab Potential  Excellent    PT Frequency  2x / week    PT Duration  4 weeks    PT Treatment/Interventions  ADLs/Self Care Home Management;Cryotherapy;Neurosurgeon;Therapeutic activities;Therapeutic exercise;Neuromuscular re-education;Patient/family education;Passive range of motion;Manual techniques;Vasopneumatic Device    PT Next Visit Plan  PAIN-FREE LEFT KNEE EXERCISES; VASO AND E' STIM. consider wall sits    Consulted and Agree with Plan of Care  Patient       Patient will benefit from skilled therapeutic intervention in order to improve the following deficits and impairments:  Abnormal gait, Pain, Increased edema, Decreased strength  Visit Diagnosis: Chronic pain of left knee  Localized edema     Problem List Patient Active Problem List   Diagnosis Date Noted  . Morbid obesity (HCC) 03/24/2015    Marvell Fuller, PTA 12/07/2017, 2:29 PM  [facility] [address] [place], [place], [address] Phone: [phone]   Fax:  [phone]  Name: Carla Rogers MRN: [id number] Date of Birth: 1975/09/25

## 2017-12-11 ENCOUNTER — Ambulatory Visit: Payer: Managed Care, Other (non HMO) | Admitting: Physical Therapy

## 2017-12-11 ENCOUNTER — Encounter: Payer: Self-pay | Admitting: Physical Therapy

## 2017-12-11 DIAGNOSIS — M25562 Pain in left knee: Secondary | ICD-10-CM | POA: Diagnosis not present

## 2017-12-11 DIAGNOSIS — R600 Localized edema: Secondary | ICD-10-CM

## 2017-12-11 DIAGNOSIS — G8929 Other chronic pain: Secondary | ICD-10-CM

## 2017-12-11 NOTE — Therapy (Signed)
[facility] [address] [place], [place], [address] Phone: [phone]   Fax:  [phone]  Physical Therapy Treatment  Patient Details  Name: Carla Rogers MRN: [id number] Date of Birth: 08/21/1975 Referring Provider: Eugenia Mcalpine MD   Encounter Date: 12/11/2017  PT End of Session - 12/11/17 1352    Visit Number  6    Number of Visits  12    Date for PT Re-Evaluation  12/20/17    Authorization Type  FOTO AT LEAST EVERY 5TH VISIT AND 10TH VISIT PROGRESS NOTE.    PT Start Time  1346    PT Stop Time  1430    PT Time Calculation (min)  44 min    Activity Tolerance  Patient tolerated treatment well    Behavior During Therapy  [facility] for tasks assessed/performed       Past Medical History:  Diagnosis Date  . Anxiety   . Complication of anesthesia    slow to wake up   . Depression   . Diabetes mellitus without complication (HCC)   . GERD (gastroesophageal reflux disease)   . Hypertension   . Overactive bladder   . PONV (postoperative nausea and vomiting)   . Sleep apnea    cpap     Past Surgical History:  Procedure Laterality Date  . CHOLECYSTECTOMY    . COLONOSCOPY    . exploratory vaginal surgery     . LAPAROSCOPIC GASTRIC SLEEVE RESECTION N/A 03/24/2015   Procedure: LAPAROSCOPIC GASTRIC SLEEVE RESECTION UPPER ENDOSCOPY;  Surgeon: Ovidio Kin, MD;  [facility]: [facility];  Service: General;  Laterality: N/A;  . LAPAROSCOPY    . pilonidal cyst removal     . UPPER GI ENDOSCOPY  03/24/2015   Procedure: UPPER GI ENDOSCOPY;  Surgeon: Ovidio Kin, MD;  [facility];  Service: General;;    There were no vitals filed for this visit.  Subjective Assessment - 12/11/17 1347    Subjective  Reports more sciatic pain in L low back since last thursday. Reports that she has also noted more inflammation in L knee since sciatic pain has started.     Pertinent History  genu recurvatum , DM    Limitations  Walking;House hold activities     Patient Stated Goals  Get back to normal life.    Currently in Pain?  Yes    Pain Score  -- No pain score provided by patient for knee or back         [facility] PT Assessment - 12/11/17 0001      Assessment   Medical Diagnosis  Left knee scope.    Onset Date/Surgical Date  11/14/17    Hand Dominance  Right    Next MD Visit  11/2017      Restrictions   Weight Bearing Restrictions  No                   [facility] Adult PT Treatment/Exercise - 12/11/17 0001      Knee/Hip Exercises: Stretches   Passive Hamstring Stretch  Left;3 reps;30 seconds    ITB Stretch  Left;3 reps;30 seconds    Piriformis Stretch  Left;3 reps;30 seconds      Knee/Hip Exercises: Aerobic   Stationary Bike  L3 x10 min      Knee/Hip Exercises: Standing   Lateral Step Up  Right;20 reps;Hand Hold: 2;Step Height: 4"    Forward Step Up  Right;20 reps;Hand Hold: 2;Step Height: 6";Step Height: 4"    Rocker  Board  3 minutes      Knee/Hip Exercises: Supine   Bridges with Newman Pies Squeeze  Strengthening;Both;20 reps    Straight Leg Raises  AROM;Left;20 reps      Knee/Hip Exercises: Sidelying   Hip ABduction  Strengthening;Left;20 reps      Modalities   Modalities  Vasopneumatic      Vasopneumatic   Number Minutes Vasopneumatic   15 minutes    Vasopnuematic Location   Knee    Vasopneumatic Pressure  Medium    Vasopneumatic Temperature   34                  PT Long Term Goals - 11/30/17 1414      PT LONG TERM GOAL #1   Title  Ind with a HEP.    Baseline  5/5 with MMT, but functionally she eccentric quad weakness    Time  4    Period  Weeks    Status  On-going      PT LONG TERM GOAL #2   Title  Pt will improve her gait to step through gait pattern with equalized step length and heel to toe gait pattern for > 1000 feet on community level surfaces.     Baseline  mild gait deviations including decreased stride length    Time  4    Period  Weeks    Status  On-going      PT LONG TERM GOAL #3    Title  Pt will report no pain with ADL's.     Time  4    Period  Weeks    Status  On-going      PT LONG TERM GOAL #4   Title  Perform a reciprocating stair gait with one railing with pain not > 2-3/10.    Baseline  125 deg    Time  4    Period  Weeks    Status  On-going            Plan - 12/11/17 1422    Clinical Impression Statement  Patient tolerated today's treatment fairly well as she arrived with increased L sciatic pain and L knee pain. Increased discomfort noted in L knee with forward step ups to 6" step but not to 4" step. More L knee/hip stretches completed today to reduce knee/hip tightness which could be result of L sciatic symptoms. No complaints from patient with supine exercises. Normal vasopneumatic response noted following removal of the modality.    Rehab Potential  Excellent    PT Frequency  2x / week    PT Duration  4 weeks    PT Treatment/Interventions  ADLs/Self Care Home Management;Cryotherapy;Neurosurgeon;Therapeutic activities;Therapeutic exercise;Neuromuscular re-education;Patient/family education;Passive range of motion;Manual techniques;Vasopneumatic Device    PT Next Visit Plan  PAIN-FREE LEFT KNEE EXERCISES; VASO AND E' STIM. consider wall sits    Consulted and Agree with Plan of Care  Patient       Patient will benefit from skilled therapeutic intervention in order to improve the following deficits and impairments:  Abnormal gait, Pain, Increased edema, Decreased strength  Visit Diagnosis: Chronic pain of left knee  Localized edema     Problem List Patient Active Problem List   Diagnosis Date Noted  . Morbid obesity (HCC) 03/24/2015    Marvell Fuller, PTA 12/11/2017, 2:35 PM  [facility] [address] [place], [place], [address] Phone: [phone]   Fax:  [phone]  Name: Carla Rogers MRN:  [id number] Date of Birth: 1976/02/20

## 2017-12-14 ENCOUNTER — Encounter: Payer: Self-pay | Admitting: Physical Therapy

## 2017-12-14 ENCOUNTER — Ambulatory Visit: Payer: Managed Care, Other (non HMO) | Admitting: Physical Therapy

## 2017-12-14 DIAGNOSIS — R600 Localized edema: Secondary | ICD-10-CM

## 2017-12-14 DIAGNOSIS — M25562 Pain in left knee: Principal | ICD-10-CM

## 2017-12-14 DIAGNOSIS — G8929 Other chronic pain: Secondary | ICD-10-CM

## 2017-12-14 NOTE — Therapy (Signed)
[facility] [place], [place], [address] Phone: [phone]   Fax:  [phone]  Physical Therapy Treatment  Patient Details  Name: Carla Rogers MRN: [id number] Date of Birth: 11-14-1975 Referring Provider: Sydnee Cabal MD   Encounter Date: 12/14/2017  PT End of Session - 12/14/17 0936    Visit Number  7    Number of Visits  12    Date for PT Re-Evaluation  12/20/17    Authorization Type  FOTO AT LEAST EVERY 5TH VISIT AND 10TH VISIT PROGRESS NOTE.    PT Start Time  0908    PT Stop Time  0955    PT Time Calculation (min)  47 min    Activity Tolerance  Patient tolerated treatment well    Behavior During Therapy  [facility] for tasks assessed/performed       Past Medical History:  Diagnosis Date  . Anxiety   . Complication of anesthesia    slow to wake up   . Depression   . Diabetes mellitus without complication ([place])   . GERD (gastroesophageal reflux disease)   . Hypertension   . Overactive bladder   . PONV (postoperative nausea and vomiting)   . Sleep apnea    cpap     Past Surgical History:  Procedure Laterality Date  . CHOLECYSTECTOMY    . COLONOSCOPY    . exploratory vaginal surgery     . LAPAROSCOPIC GASTRIC SLEEVE RESECTION N/A 03/24/2015   Procedure: LAPAROSCOPIC GASTRIC SLEEVE RESECTION UPPER ENDOSCOPY;  Surgeon: Alphonsa Overall, MD;  Location: [facility];  Service: General;  Laterality: N/A;  . LAPAROSCOPY    . pilonidal cyst removal     . UPPER GI ENDOSCOPY  03/24/2015   Procedure: UPPER GI ENDOSCOPY;  Surgeon: Alphonsa Overall, MD;  Location: [facility];  Service: General;;    There were no vitals filed for this visit.  Subjective Assessment - 12/14/17 0915    Subjective  Patient arrived with more discomfort in back than knee today    Pertinent History  genu recurvatum , DM    Limitations  Walking;House hold activities    Patient Stated Goals  Get back to normal life.    Currently in Pain?  Yes    Pain Score  2      Pain Location  Knee    Pain Orientation  Left    Pain Type  Acute pain    Pain Onset  More than a month ago    Aggravating Factors   increased activity    Pain Relieving Factors  at rest                       [facility] Adult PT Treatment/Exercise - 12/14/17 0001      Knee/Hip Exercises: Aerobic   Nustep  L5 x54mn      Knee/Hip Exercises: Standing   Terminal Knee Extension  Strengthening;Left;20 reps;Theraband;Other (comment);Limitations;10 reps    Theraband Level (Terminal Knee Extension)  Level 2 (Red)    Lateral Step Up  Right;20 reps;Hand Hold: 2;Step Height: 4"    Forward Step Up  Right;20 reps;Hand Hold: 2;Step Height: 6";Step Height: 4"    Step Down  Right;20 reps;Step Height: 4"    Rocker Board  3 minutes      Knee/Hip Exercises: Supine   Straight Leg Raises  AROM;Left;20 reps      Knee/Hip Exercises: Sidelying   Hip ABduction  Strengthening;Left;20 reps  Vasopneumatic   Number Minutes Vasopneumatic   15 minutes    Vasopnuematic Location   Knee    Vasopneumatic Pressure  Medium             PT Education - 12/14/17 0925    Education provided  Yes    Education Details  HEP    Person(s) Educated  Patient    Methods  Explanation;Demonstration;Handout    Comprehension  Returned demonstration;Verbalized understanding          PT Long Term Goals - 12/14/17 0950      PT LONG TERM GOAL #1   Title  Ind with a HEP.    Time  4    Period  Weeks    Status  Achieved      PT LONG TERM GOAL #2   Title  Pt will improve her gait to step through gait pattern with equalized step length and heel to toe gait pattern for > 1000 feet on community level surfaces.     Time  4    Period  Weeks    Status  On-going      PT LONG TERM GOAL #3   Title  Pt will report no pain with ADL's.     Time  4    Period  Weeks    Status  On-going      PT LONG TERM GOAL #4   Title  Perform a reciprocating stair gait with one railing with pain not > 2-3/10.     Time  4    Period  Weeks    Status  On-going            Plan - 12/14/17 0944    Clinical Impression Statement  Patient tolerated treatment well today. Patient has some increased pain in low back which is limiting her from movements and certain activity. Patient was educated on exercises for knee with HEP provided. Patient educted on posture and techniques to protect bavk and further pain. Patient met LTG #1 other goals ongoing at this time. Patient has F/U appt with MD next week.     Rehab Potential  Excellent    PT Frequency  2x / week    PT Duration  4 weeks    PT Treatment/Interventions  ADLs/Self Care Home Management;Cryotherapy;Pharmacist, hospital;Therapeutic activities;Therapeutic exercise;Neuromuscular re-education;Patient/family education;Passive range of motion;Manual techniques;Vasopneumatic Device    PT Next Visit Plan  PAIN-FREE LEFT KNEE EXERCISES; VASO AND E' STIM. consider wall sits    Consulted and Agree with Plan of Care  Patient       Patient will benefit from skilled therapeutic intervention in order to improve the following deficits and impairments:  Abnormal gait, Pain, Increased edema, Decreased strength  Visit Diagnosis: Chronic pain of left knee  Localized edema     Problem List Patient Active Problem List   Diagnosis Date Noted  . Morbid obesity ([place]) 03/24/2015    Joevanni Roddey P, PTA 12/14/2017, 10:00 AM  [facility] [place], [place], [address] Phone: [phone]   Fax:  [phone]  Name: Carla Rogers MRN: [id number] Date of Birth: 04-29-1976

## 2017-12-14 NOTE — Patient Instructions (Addendum)
  Step-Down / Step-Up   Stand on stair step or __6__ inch stool. Slowly bend left leg, lowering other foot to floor. Return by straightening front leg. Repeat __10__ times per set. Do __2-3__ sets per session. Do __2-3__ sessions per day.    Strengthening: Hip Abduction (Side-Lying)   Tighten muscles on front of left thigh, then lift leg __5__ inches from surface, keeping knee locked.  Repeat __10__ times per set. Do __2__ sets per session. Do _2___ sessions per day.    Terminal Knee Extension (Standing)    Facing anchor with right knee slightly bent and tubing just above knee, gently pull knee back straight. Do not overextend knee. Repeat _10___ times per set. Do __1-3__ sets per session. Do _1-2___ sessions per day.

## 2017-12-18 ENCOUNTER — Encounter: Payer: Self-pay | Admitting: Physical Therapy

## 2017-12-18 ENCOUNTER — Ambulatory Visit: Payer: Managed Care, Other (non HMO) | Admitting: Physical Therapy

## 2017-12-18 DIAGNOSIS — M25562 Pain in left knee: Secondary | ICD-10-CM | POA: Diagnosis not present

## 2017-12-18 DIAGNOSIS — R600 Localized edema: Secondary | ICD-10-CM

## 2017-12-18 DIAGNOSIS — G8929 Other chronic pain: Secondary | ICD-10-CM

## 2017-12-18 DIAGNOSIS — M6281 Muscle weakness (generalized): Secondary | ICD-10-CM

## 2017-12-18 NOTE — Therapy (Addendum)
[facility] [place], [place], [address] Phone: [phone]   Fax:  [phone]  Physical Therapy Treatment  Patient Details  Name: XAN SPARKMAN MRN: [id number] Date of Birth: [birth year] Referring Provider: Sydnee Cabal MD   Encounter Date: 12/18/2017  PT End of Session - 12/18/17 1117    Visit Number  8    Number of Visits  12    Date for PT Re-Evaluation  12/20/17    Authorization Type  FOTO AT LEAST EVERY 5TH VISIT AND 10TH VISIT PROGRESS NOTE.    PT Start Time  1116    PT Stop Time  1211    PT Time Calculation (min)  55 min    Activity Tolerance  Patient tolerated treatment well    Behavior During Therapy  [facility] for tasks assessed/performed       Past Medical History:  Diagnosis Date  . Anxiety   . Complication of anesthesia    slow to wake up   . Depression   . Diabetes mellitus without complication ([place])   . GERD (gastroesophageal reflux disease)   . Hypertension   . Overactive bladder   . PONV (postoperative nausea and vomiting)   . Sleep apnea    cpap     Past Surgical History:  Procedure Laterality Date  . CHOLECYSTECTOMY    . COLONOSCOPY    . exploratory vaginal surgery     . LAPAROSCOPIC GASTRIC SLEEVE RESECTION N/A 03/24/2015   Procedure: LAPAROSCOPIC GASTRIC SLEEVE RESECTION UPPER ENDOSCOPY;  Surgeon: Alphonsa Overall, MD;  [facility];  Service: General;  Laterality: N/A;  . LAPAROSCOPY    . pilonidal cyst removal     . UPPER GI ENDOSCOPY  03/24/2015   Procedure: UPPER GI ENDOSCOPY;  Surgeon: Alphonsa Overall, MD;  [facility];  Service: General;;    There were no vitals filed for this visit.  Subjective Assessment - 12/18/17 1115    Subjective  Reports that knee and back are better just feeling some tightness in buttock region.    Pertinent History  genu recurvatum , DM    Limitations  Walking;House hold activities    Patient Stated Goals  Get back to normal life.    Currently in  Pain?  Yes    Pain Score  1     Pain Location  Knee    Pain Orientation  Left    Pain Descriptors / Indicators  Discomfort    Pain Type  Acute pain    Pain Onset  More than a month ago         [facility] PT Assessment - 12/18/17 0001      Assessment   Medical Diagnosis  Left knee scope.    Onset Date/Surgical Date  11/14/17    Hand Dominance  Right    Next MD Visit  12/20/2017      Restrictions   Weight Bearing Restrictions  No                   [facility] Adult PT Treatment/Exercise - 12/18/17 0001      Ambulation/Gait   Ambulation/Gait  Yes    Ambulation/Gait Assistance  7: Independent    Ambulation Distance (Feet)  80 Feet    Assistive device  None    Gait Pattern  Within Functional Limits    Ambulation Surface  Level;Indoor    Stairs  Yes    Stairs Assistance  6: Modified independent (Device/Increase time)  Stair Management Technique  One rail Left;Alternating pattern;Forwards    Number of Stairs  4 x1 RT    Height of Stairs  6.5      Knee/Hip Exercises: Aerobic   Nustep  L5 x43mn      Knee/Hip Exercises: Machines for Strengthening   Cybex Knee Extension  10# 3x10 reps    Cybex Knee Flexion  30# 3x10 reps      Knee/Hip Exercises: Standing   Terminal Knee Extension  Strengthening;Left;20 reps;Theraband;Other (comment);Limitations;10 reps    Theraband Level (Terminal Knee Extension)  Level 2 (Red)    Lateral Step Up  Right;20 reps;Hand Hold: 2;Step Height: 4"    Forward Step Up  Right;20 reps;Hand Hold: 2;Step Height: 4";5 reps;Step Height: 6" 5 reps attempted to 6" step with 4/10 pain reported    Step Down  Right;20 reps;Step Height: 4"      Modalities   Modalities  Vasopneumatic      Vasopneumatic   Number Minutes Vasopneumatic   15 minutes    Vasopnuematic Location   Knee    Vasopneumatic Pressure  Medium    Vasopneumatic Temperature   34                  PT Long Term Goals - 12/18/17 1146      PT LONG TERM GOAL #1   Title  Ind  with a HEP.    Time  4    Period  Weeks    Status  Achieved      PT LONG TERM GOAL #2   Title  Pt will improve her gait to step through gait pattern with equalized step length and heel to toe gait pattern for > 1000 feet on community level surfaces.     Time  4    Period  Weeks    Status  Achieved      PT LONG TERM GOAL #3   Title  Pt will report no pain with ADL's.     Time  4    Period  Weeks    Status  Partially Met "Twinges with turning and steps." 12/18/2017      PT LONG TERM GOAL #4   Title  Perform a reciprocating stair gait with one railing with pain not > 2-3/10.    Time  4    Period  Weeks    Status  Partially Met Reports more discomfort with ascending and lack of trust with descending 12/18/2017            Plan - 12/18/17 1201    Clinical Impression Statement  Patient tolerated today's treatment well as she reports low level L knee pain. Patient still limited with higher step heights secondary to pain (4/10 L knee pain with 6" step ups). Patient able to stand in her normal genu recurvatum. Patient able to ambulate WNL in clinic and without gait deviations but limited with ADLs secondary to "twinges" of pain with turning as well as stairs. Normal vasopnuematic response noted following removal of the modality. 8th FOTO score 38% from initial score of 74%.    Rehab Potential  Excellent    PT Frequency  2x / week    PT Duration  4 weeks    PT Treatment/Interventions  ADLs/Self Care Home Management;Cryotherapy;EPharmacist, hospitalTherapeutic activities;Therapeutic exercise;Neuromuscular re-education;Patient/family education;Passive range of motion;Manual techniques;Vasopneumatic Device    PT Next Visit Plan  Continue per MD discretion.    Consulted and Agree with Plan of Care  Patient  Patient will benefit from skilled therapeutic intervention in order to improve the following deficits and impairments:  Abnormal gait, Pain, Increased  edema, Decreased strength  Visit Diagnosis: Chronic pain of left knee  Localized edema  Acute pain of left knee  Muscle weakness (generalized)     Problem List Patient Active Problem List   Diagnosis Date Noted  . Morbid obesity ([place]) 03/24/2015   Standley Brooking, PTA 12/18/17 12:59 PM   [facility] [address] [place], [place], [address] Phone: [phone]   Fax:  [phone]  Name: BLESS LISENBY MRN: [id number] Date of Birth: 04-30-1976

## 2018-01-01 ENCOUNTER — Encounter: Payer: Self-pay | Admitting: Physical Therapy

## 2018-01-01 ENCOUNTER — Ambulatory Visit: Payer: Managed Care, Other (non HMO) | Attending: Specialist | Admitting: Physical Therapy

## 2018-01-01 DIAGNOSIS — R600 Localized edema: Secondary | ICD-10-CM | POA: Diagnosis present

## 2018-01-01 DIAGNOSIS — M25562 Pain in left knee: Secondary | ICD-10-CM | POA: Insufficient documentation

## 2018-01-01 DIAGNOSIS — G8929 Other chronic pain: Secondary | ICD-10-CM | POA: Diagnosis present

## 2018-01-01 DIAGNOSIS — M6281 Muscle weakness (generalized): Secondary | ICD-10-CM | POA: Insufficient documentation

## 2018-01-01 NOTE — Therapy (Signed)
[facility] [place], [place], [address] Phone: [phone]   Fax:  [phone]  Physical Therapy Treatment  Patient Details  Name: Carla Rogers MRN: [id number] Date of Birth: 05/20/1976 Referring Provider: Sydnee Cabal MD   Encounter Date: 01/01/2018  PT End of Session - 01/01/18 1119    Visit Number  9    Number of Visits  12    Date for PT Re-Evaluation  12/20/17    Authorization Type  FOTO AT LEAST EVERY 5TH VISIT AND 10TH VISIT PROGRESS NOTE.    PT Start Time  1031    PT Stop Time  1129    PT Time Calculation (min)  58 min    Activity Tolerance  Patient tolerated treatment well    Behavior During Therapy  WFL for tasks assessed/performed       Past Medical History:  Diagnosis Date  . Anxiety   . Complication of anesthesia    slow to wake up   . Depression   . Diabetes mellitus without complication ([place])   . GERD (gastroesophageal reflux disease)   . Hypertension   . Overactive bladder   . PONV (postoperative nausea and vomiting)   . Sleep apnea    cpap     Past Surgical History:  Procedure Laterality Date  . CHOLECYSTECTOMY    . COLONOSCOPY    . exploratory vaginal surgery     . LAPAROSCOPIC GASTRIC SLEEVE RESECTION N/A 03/24/2015   Procedure: LAPAROSCOPIC GASTRIC SLEEVE RESECTION UPPER ENDOSCOPY;  Surgeon: Alphonsa Overall, MD;  Location: [facility];  Service: General;  Laterality: N/A;  . LAPAROSCOPY    . pilonidal cyst removal     . UPPER GI ENDOSCOPY  03/24/2015   Procedure: UPPER GI ENDOSCOPY;  Surgeon: Alphonsa Overall, MD;  Location: [facility];  Service: General;;    There were no vitals filed for this visit.  Subjective Assessment - 01/01/18 1034    Subjective  Patient went to MD and is to continue therapy. After her MD appt she was at home and twisted he knee when she went to stand up and turned wrong to avoid stepping on her child who was laying on floor.     Pertinent History  genu recurvatum , DM    Limitations  Walking;House hold activities    Patient Stated Goals  Get back to normal life.    Currently in Pain?  Yes    Pain Score  3     Pain Location  Knee    Pain Orientation  Left    Pain Descriptors / Indicators  Discomfort    Pain Type  Acute pain    Pain Onset  More than a month ago    Pain Frequency  Intermittent    Aggravating Factors   recent pain from twisting knee with any activity    Pain Relieving Factors  at rest                       [facility] Adult PT Treatment/Exercise - 01/01/18 0001      Knee/Hip Exercises: Aerobic   Nustep  L5 x42mn UE/LE      Electrical Stimulation   Electrical Stimulation Location  Left knee.    Electrical Stimulation Action  IFC    Electrical Stimulation Parameters  1-10hz  x188m    Electrical Stimulation Goals  Pain      Ultrasound   Ultrasound Location  left medial knee and post knee  Ultrasound Parameters  1.5w/cm2/50%/3mz x169m    Ultrasound Goals  Pain      Vasopneumatic   Number Minutes Vasopneumatic   15 minutes    Vasopnuematic Location   Knee    Vasopneumatic Pressure  Medium      Manual Therapy   Manual Therapy  Soft tissue mobilization;Myofascial release    Manual therapy comments  manual STW to post knee and calf                  PT Long Term Goals - 12/18/17 1146      PT LONG TERM GOAL #1   Title  Ind with a HEP.    Time  4    Period  Weeks    Status  Achieved      PT LONG TERM GOAL #2   Title  Pt will improve her gait to step through gait pattern with equalized step length and heel to toe gait pattern for > 1000 feet on community level surfaces.     Time  4    Period  Weeks    Status  Achieved      PT LONG TERM GOAL #3   Title  Pt will report no pain with ADL's.     Time  4    Period  Weeks    Status  Partially Met "Twinges with turning and steps." 12/18/2017      PT LONG TERM GOAL #4   Title  Perform a reciprocating stair gait with one railing with pain not > 2-3/10.     Time  4    Period  Weeks    Status  Partially Met Reports more discomfort with ascending and lack of trust with descending 12/18/2017            Plan - 01/01/18 1121    Clinical Impression Statement  Patient tolerated treatment fair today. Patient has had a flare up of pain medial and posterior knee. Today focused on manual STW and USKoreao help reduce pain in knee. Patient unable to tolerate standing exercises due to discomfort with any ADLs or weight bearing activities due to recent injury a week ago when twisting knee at home. Goals ongoing.     Rehab Potential  Excellent    PT Frequency  2x / week    PT Duration  4 weeks    PT Treatment/Interventions  ADLs/Self Care Home Management;Cryotherapy;ElPharmacist, hospitalherapeutic activities;Therapeutic exercise;Neuromuscular re-education;Patient/family education;Passive range of motion;Manual techniques;Vasopneumatic Device    PT Next Visit Plan  Continue with POC per MPT    Consulted and Agree with Plan of Care  Patient       Patient will benefit from skilled therapeutic intervention in order to improve the following deficits and impairments:  Abnormal gait, Pain, Increased edema, Decreased strength  Visit Diagnosis: Chronic pain of left knee  Localized edema     Problem List Patient Active Problem List   Diagnosis Date Noted  . Morbid obesity ([place]08/23/2016   ChLadean RayaPTA 01/01/18 11:37 AM  [facility] [id number]: [phone] Fax:  [phone]: Carla ROGSTADRN: [id number] of Birth: 12/05/27/1977

## 2018-01-05 ENCOUNTER — Encounter: Payer: Self-pay | Admitting: Physical Therapy

## 2018-01-05 ENCOUNTER — Ambulatory Visit: Payer: Managed Care, Other (non HMO) | Admitting: Physical Therapy

## 2018-01-05 DIAGNOSIS — M25562 Pain in left knee: Secondary | ICD-10-CM | POA: Diagnosis not present

## 2018-01-05 DIAGNOSIS — G8929 Other chronic pain: Secondary | ICD-10-CM

## 2018-01-05 DIAGNOSIS — R600 Localized edema: Secondary | ICD-10-CM

## 2018-01-05 NOTE — Therapy (Signed)
[facility] [place], [place], [address] Phone: [phone]   Fax:  [phone]  Physical Therapy Treatment  Patient Details  Name: ADARA KITTLE MRN: [id number] Date of Birth: 1976-01-01 Referring Provider: Sydnee Cabal MD   Encounter Date: 01/05/2018  PT End of Session - 01/05/18 1115    Visit Number  10    Number of Visits  12    Date for PT Re-Evaluation  12/20/17    Authorization Type  FOTO AT LEAST EVERY 5TH VISIT AND 10TH VISIT PROGRESS NOTE.       Past Medical History:  Diagnosis Date  . Anxiety   . Complication of anesthesia    slow to wake up   . Depression   . Diabetes mellitus without complication ([place])   . GERD (gastroesophageal reflux disease)   . Hypertension   . Overactive bladder   . PONV (postoperative nausea and vomiting)   . Sleep apnea    cpap     Past Surgical History:  Procedure Laterality Date  . CHOLECYSTECTOMY    . COLONOSCOPY    . exploratory vaginal surgery     . LAPAROSCOPIC GASTRIC SLEEVE RESECTION N/A 03/24/2015   Procedure: LAPAROSCOPIC GASTRIC SLEEVE RESECTION UPPER ENDOSCOPY;  Surgeon: Alphonsa Overall, MD;  Location: [facility];  Service: General;  Laterality: N/A;  . LAPAROSCOPY    . pilonidal cyst removal     . UPPER GI ENDOSCOPY  03/24/2015   Procedure: UPPER GI ENDOSCOPY;  Surgeon: Alphonsa Overall, MD;  Location: [facility];  Service: General;;    There were no vitals filed for this visit.  Subjective Assessment - 01/05/18 1118    Subjective  Patient discouraged over the recent flare-up of her left knee pain.    Currently in Pain?  Yes    Pain Score  4     Pain Orientation  Left    Pain Type  Acute pain    Pain Onset  More than a month ago                       [facility] Adult PT Treatment/Exercise - 01/05/18 0001      Exercises   Exercises  Knee/Hip      Knee/Hip Exercises: Aerobic   Stationary Bike  level 3 x 15 minutes.      Modalities   Modalities   Health visitor Stimulation Location  Left knee.    Electrical Stimulation Action  Pre-mod to medial joint line and popliteal fossa x 20 minutes.    Electrical Stimulation Goals  Pain      Vasopneumatic   Number Minutes Vasopneumatic   20 minutes    Vasopnuematic Location   -- Left knee.    Vasopneumatic Pressure  Medium                  PT Long Term Goals - 12/18/17 1146      PT LONG TERM GOAL #1   Title  Ind with a HEP.    Time  4    Period  Weeks    Status  Achieved      PT LONG TERM GOAL #2   Title  Pt will improve her gait to step through gait pattern with equalized step length and heel to toe gait pattern for > 1000 feet on community level surfaces.     Time  4    Period  Weeks    Status  Achieved      PT LONG TERM GOAL #3   Title  Pt will report no pain with ADL's.     Time  4    Period  Weeks    Status  Partially Met "Twinges with turning and steps." 12/18/2017      PT LONG TERM GOAL #4   Title  Perform a reciprocating stair gait with one railing with pain not > 2-3/10.    Time  4    Period  Weeks    Status  Partially Met Reports more discomfort with ascending and lack of trust with descending 12/18/2017            Plan - 01/05/18 1120    Clinical Impression Statement  See "Therapy Note" section.    PT Treatment/Interventions  ADLs/Self Care Home Management;Cryotherapy;Pharmacist, hospital;Therapeutic activities;Therapeutic exercise;Neuromuscular re-education;Patient/family education;Passive range of motion;Manual techniques;Vasopneumatic Device    PT Next Visit Plan  Continue with POC per MPT    Consulted and Agree with Plan of Care  Patient       Patient will benefit from skilled therapeutic intervention in order to improve the following deficits and impairments:  Abnormal gait, Pain, Increased edema, Decreased strength  Visit Diagnosis: Chronic pain of  left knee  Localized edema  Acute pain of left knee     Problem List Patient Active Problem List   Diagnosis Date Noted  . Morbid obesity ([place]) 03/24/2015   Progress Note Reporting Period 11/22/17 to 01/05/18  See note below for Objective Data and Assessment of Progress/Goals. Patient had a significant flare-up recently when she tried to avoid her son who was lying on the floor behind her.  She has increased pain but her left knee range of motion was full and without notable crepitus.  She c/o pain in the region of her popliteal fossa dn medial joint line.     Marymargaret Kirker, [place] MPT 01/05/2018, 11:25 AM  [facility] [address] [place], [place], [address] Phone: [phone]   Fax:  [phone]  Name: DEVYNN HESSLER MRN: [id number] Date of Birth: [birth year]

## 2018-01-19 ENCOUNTER — Ambulatory Visit: Payer: Managed Care, Other (non HMO) | Admitting: *Deleted

## 2018-01-19 DIAGNOSIS — M6281 Muscle weakness (generalized): Secondary | ICD-10-CM

## 2018-01-19 DIAGNOSIS — M25562 Pain in left knee: Secondary | ICD-10-CM | POA: Diagnosis not present

## 2018-01-19 DIAGNOSIS — R600 Localized edema: Secondary | ICD-10-CM

## 2018-01-19 DIAGNOSIS — G8929 Other chronic pain: Secondary | ICD-10-CM

## 2018-01-19 NOTE — Therapy (Signed)
[facility] [place], [place], [address] Phone: [phone]   Fax:  [phone]  Physical Therapy Treatment  Patient Details  Name: Carla Rogers MRN: [id number] Date of Birth: [birth year] Referring Provider: Sydnee Cabal MD   Encounter Date: 01/19/2018  PT End of Session - 01/19/18 1003    Visit Number  11    Number of Visits  18    Date for PT Re-Evaluation  01/24/18    Authorization Type  FOTO AT LEAST EVERY 5TH VISIT AND 10TH VISIT PROGRESS NOTE.    PT Start Time  [phone]    PT Stop Time  1051    PT Time Calculation (min)  60 min       Past Medical History:  Diagnosis Date  . Anxiety   . Complication of anesthesia    slow to wake up   . Depression   . Diabetes mellitus without complication ([place])   . GERD (gastroesophageal reflux disease)   . Hypertension   . Overactive bladder   . PONV (postoperative nausea and vomiting)   . Sleep apnea    cpap     Past Surgical History:  Procedure Laterality Date  . CHOLECYSTECTOMY    . COLONOSCOPY    . exploratory vaginal surgery     . LAPAROSCOPIC GASTRIC SLEEVE RESECTION N/A 03/24/2015   Procedure: LAPAROSCOPIC GASTRIC SLEEVE RESECTION UPPER ENDOSCOPY;  Surgeon: Alphonsa Overall, MD;  Location: [facility];  Service: General;  Laterality: N/A;  . LAPAROSCOPY    . pilonidal cyst removal     . UPPER GI ENDOSCOPY  03/24/2015   Procedure: UPPER GI ENDOSCOPY;  Surgeon: Alphonsa Overall, MD;  Location: [facility];  Service: General;;    There were no vitals filed for this visit.  Subjective Assessment - 01/19/18 1001    Subjective  Patient discouraged over the recent flare-up of her left knee pain. pain LT knee 3-8/10 .  Intermittent pain with WB 7-8/10. MD doesn't know about flare-up    Pertinent History  genu recurvatum , DM    Limitations  Walking;House hold activities    Patient Stated Goals  Get back to normal life.    Currently in Pain?  Yes    Pain Score  3     Pain Location   Knee    Pain Orientation  Left    Pain Descriptors / Indicators  Discomfort    Pain Type  Acute pain    Pain Onset  More than a month ago    Pain Frequency  Intermittent                       [facility] Adult PT Treatment/Exercise - 01/19/18 0001      Exercises   Exercises  Knee/Hip      Knee/Hip Exercises: Aerobic   Stationary Bike  level 3 x 15 minutes.      Knee/Hip Exercises: Standing   Terminal Knee Extension  Strengthening;Left;20 reps;Theraband;Other (comment);Limitations;10 reps XTS green      Modalities   Modalities  Electrical Stimulation;Vasopneumatic;Ultrasound      Electrical Stimulation   Electrical Stimulation Location  Left knee. IFC x 15 mins 1-10hz     Electrical Stimulation Goals  Pain      Ultrasound   Ultrasound Location  LT knee anterior aspect    Ultrasound Parameters  1.3 w/cm2 x 8 mins    Ultrasound Goals  Pain      Vasopneumatic   Number Minutes  Vasopneumatic   15 minutes    Vasopnuematic Location   -- Left knee.    Vasopneumatic Pressure  Medium    Vasopneumatic Temperature   34                  PT Long Term Goals - 12/18/17 1146      PT LONG TERM GOAL #1   Title  Ind with a HEP.    Time  4    Period  Weeks    Status  Achieved      PT LONG TERM GOAL #2   Title  Pt will improve her gait to step through gait pattern with equalized step length and heel to toe gait pattern for > 1000 feet on community level surfaces.     Time  4    Period  Weeks    Status  Achieved      PT LONG TERM GOAL #3   Title  Pt will report no pain with ADL's.     Time  4    Period  Weeks    Status  Partially Met "Twinges with turning and steps." 12/18/2017      PT LONG TERM GOAL #4   Title  Perform a reciprocating stair gait with one railing with pain not > 2-3/10.    Time  4    Period  Weeks    Status  Partially Met Reports more discomfort with ascending and lack of trust with descending 12/18/2017            Plan - 01/19/18  1044    Clinical Impression Statement  Pt arrived today doing about the same with LT knee pain. She reports pain is usually 3/10 , but she has intermittent sharp pain when walking that is 8-9/10 anterior aspect LT knee. She was able to complete the bike and standing TKEs without any sharp pains and did well with US/ estim combo to anterior aspect patella tendon area. She had normal responses to modalities. No LTGs met today due to pain.    Clinical Presentation  Stable    Rehab Potential  Excellent    PT Frequency  2x / week    PT Duration  4 weeks    PT Treatment/Interventions  ADLs/Self Care Home Management;Cryotherapy;Pharmacist, hospital;Therapeutic activities;Therapeutic exercise;Neuromuscular re-education;Patient/family education;Passive range of motion;Manual techniques;Vasopneumatic Device    PT Next Visit Plan  Continue with POC per MPT    Consulted and Agree with Plan of Care  Patient       Patient will benefit from skilled therapeutic intervention in order to improve the following deficits and impairments:  Abnormal gait, Pain, Increased edema, Decreased strength  Visit Diagnosis: Chronic pain of left knee  Localized edema  Acute pain of left knee  Muscle weakness (generalized)     Problem List Patient Active Problem List   Diagnosis Date Noted  . Morbid obesity ([place]) 03/24/2015    Bonita Brindisi,CHRIS , PTA 01/19/2018, 11:07 AM  [facility] [address] [place], [place], [address] Phone: [phone]   Fax:  [phone]  Name: Carla Rogers MRN: [id number] Date of Birth: [birth year]

## 2018-01-23 NOTE — Addendum Note (Signed)
Addended by: APPLEGATE, ItalyHAD W on: 01/23/2018 06:31 PM   Modules accepted: Orders

## 2018-09-05 IMAGING — US US EXTREM LOW VENOUS*L*
1 series · 13 of 24 positions shown · non-contrast
Comparison: None.

CLINICAL DATA: 41-year-old female with knee surgery



[Series 1: us extrem low venous*left* · 0.08mm/px · 13 of 40 slices shown]
[im 1/40]
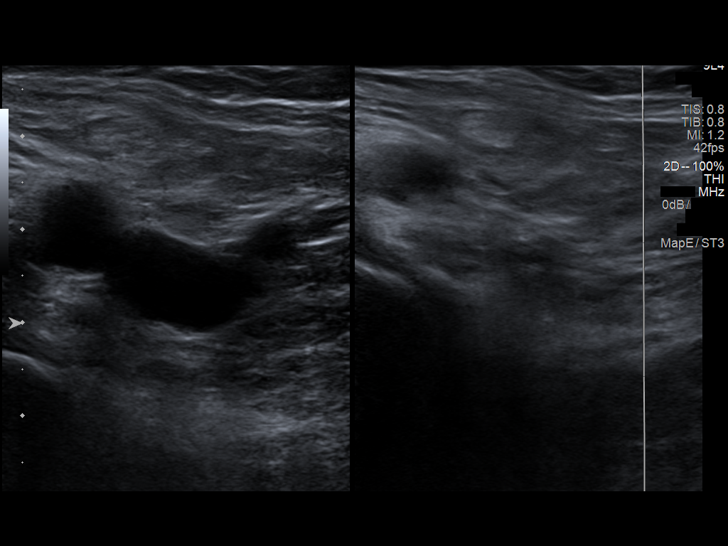
[im 4/40]
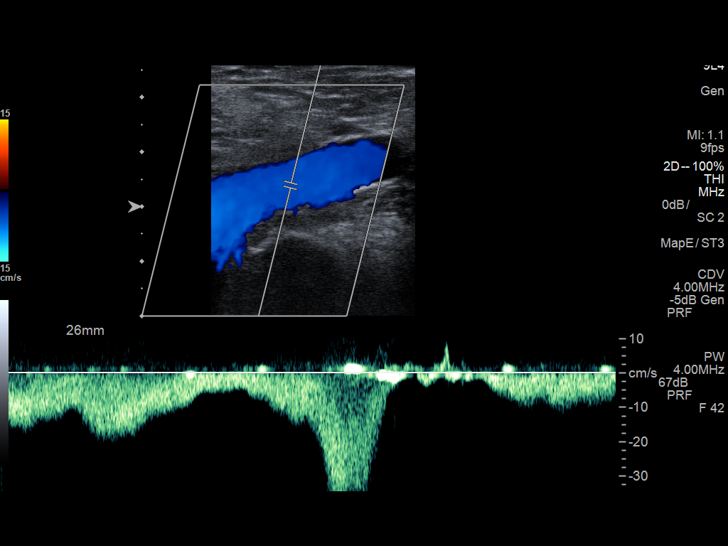
[im 7/40]
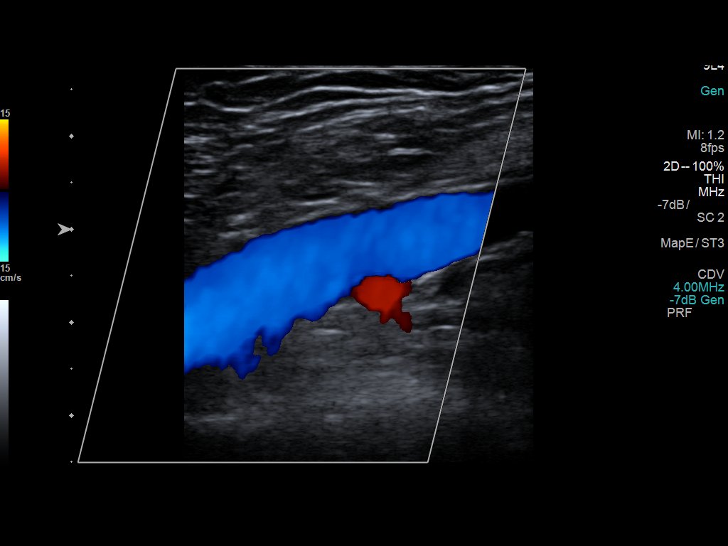
[im 11/40]
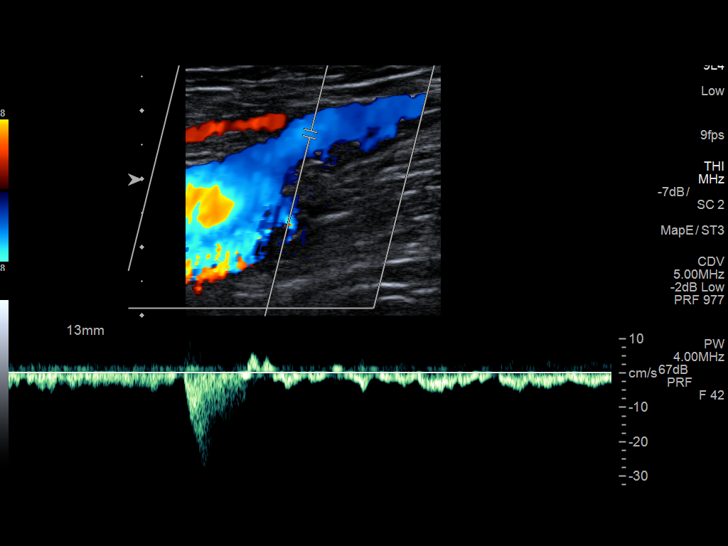
[im 14/40]
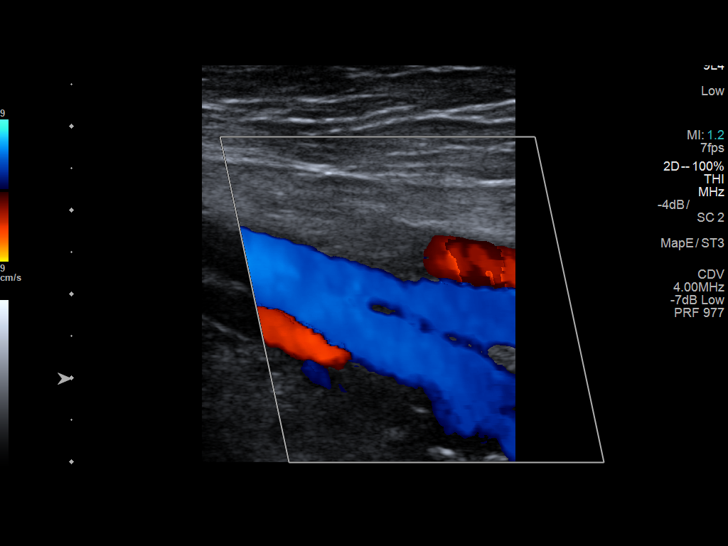
[im 17/40]
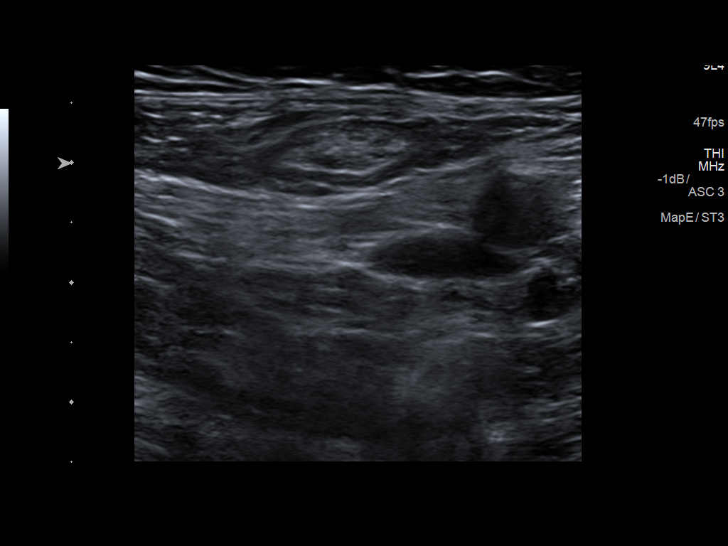
[im 21/40]
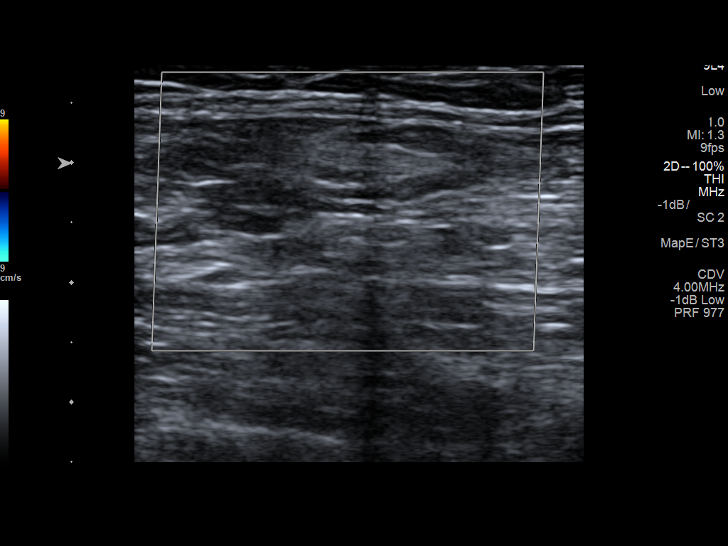
[im 23/40]
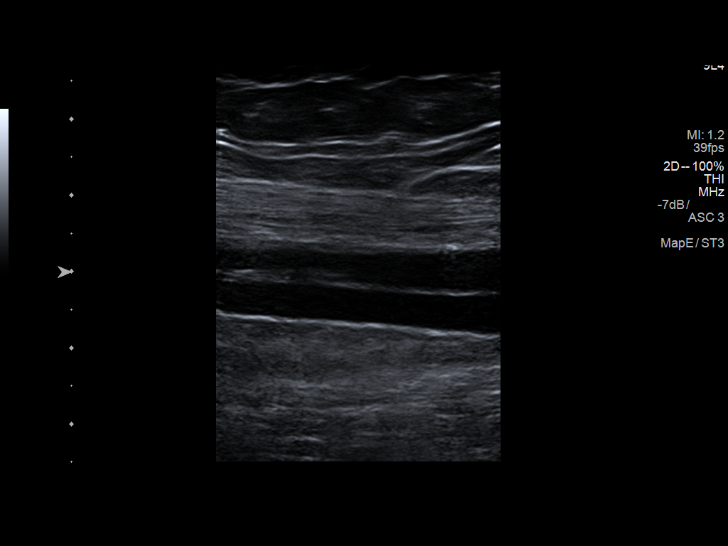
[im 26/40]
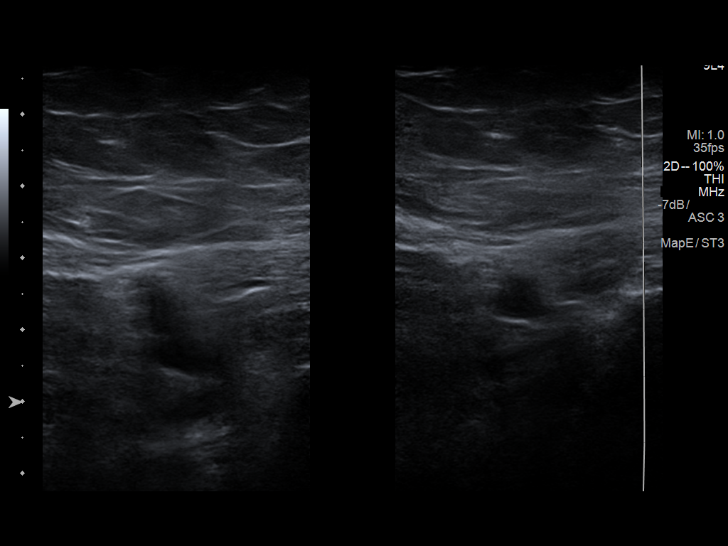
[im 29/40]
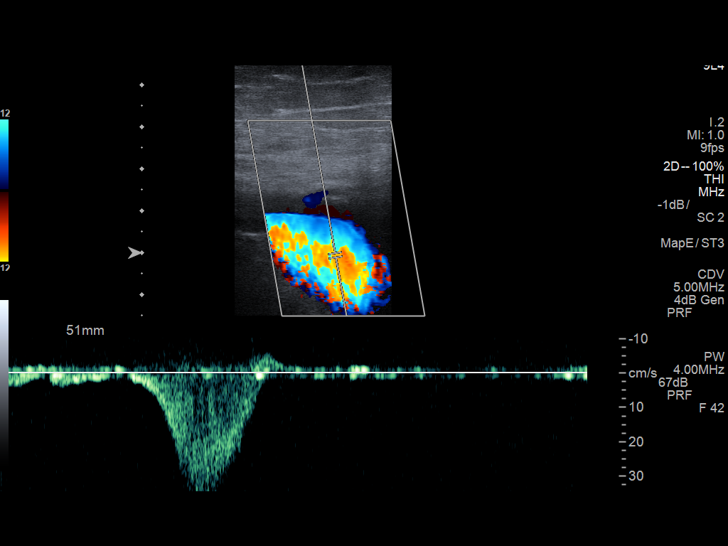
[im 33/40]
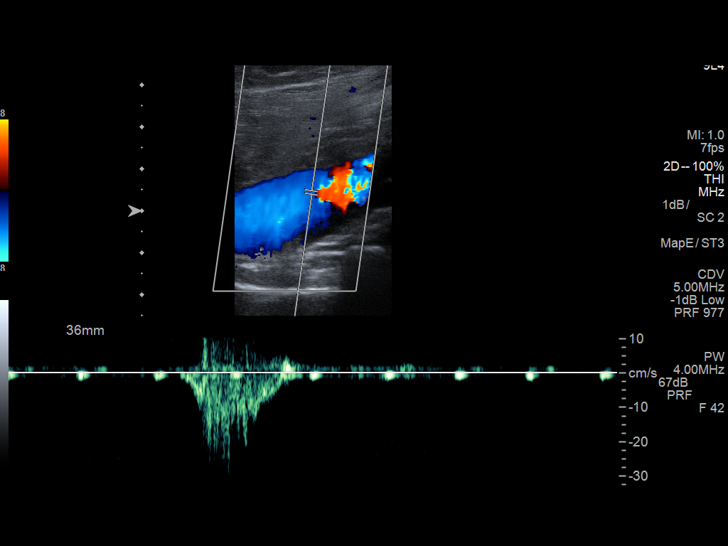
[im 36/40]
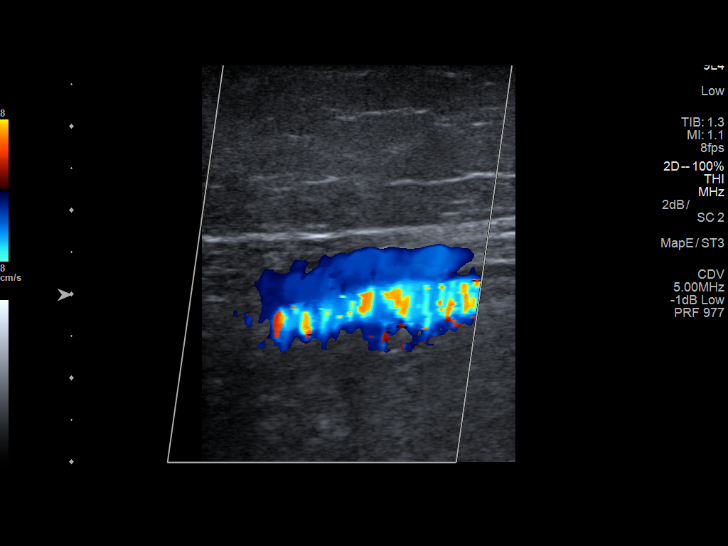
[im 40/40]
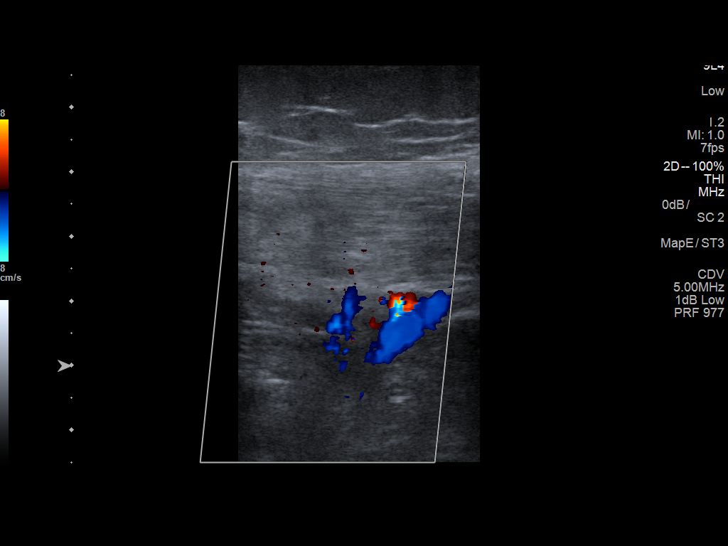

[13 of 24 positions shown; findings below may reference images not displayed]

FINDINGS: Contralateral Common Femoral Vein: Respiratory phasicity is normal
and symmetric with the symptomatic side. No evidence of thrombus.
Normal compressibility.

Common Femoral Vein: No evidence of thrombus. Normal
compressibility, respiratory phasicity and response to augmentation.

Saphenofemoral Junction: No evidence of thrombus. Normal
compressibility and flow on color Doppler imaging.

Profunda Femoral Vein: No evidence of thrombus. Normal
compressibility and flow on color Doppler imaging.

Femoral Vein: No evidence of thrombus. Normal compressibility,
respiratory phasicity and response to augmentation.

Popliteal Vein: No evidence of thrombus. Normal compressibility,
respiratory phasicity and response to augmentation.

Calf Veins: No evidence of thrombus. Normal compressibility and flow
on color Doppler imaging.

Superficial Great Saphenous Vein: No evidence of thrombus. Normal
compressibility and flow on color Doppler imaging.

Other Findings:  None.
IMPRESSION: Sonographic survey of the left lower extremity negative for DVT

## 2019-11-06 ENCOUNTER — Ambulatory Visit (INDEPENDENT_AMBULATORY_CARE_PROVIDER_SITE_OTHER): Payer: Self-pay | Admitting: Bariatrics

## 2019-11-07 ENCOUNTER — Ambulatory Visit (INDEPENDENT_AMBULATORY_CARE_PROVIDER_SITE_OTHER): Payer: BC Managed Care – PPO | Admitting: Family Medicine

## 2019-11-07 ENCOUNTER — Encounter (INDEPENDENT_AMBULATORY_CARE_PROVIDER_SITE_OTHER): Payer: Self-pay | Admitting: Family Medicine

## 2019-11-07 ENCOUNTER — Other Ambulatory Visit: Payer: Self-pay

## 2019-11-07 VITALS — BP 135/89 | HR 64 | Temp 97.8°F | Ht 62.0 in | Wt 236.0 lb

## 2019-11-07 DIAGNOSIS — Z6841 Body Mass Index (BMI) 40.0 and over, adult: Secondary | ICD-10-CM

## 2019-11-07 DIAGNOSIS — Z9189 Other specified personal risk factors, not elsewhere classified: Secondary | ICD-10-CM

## 2019-11-07 DIAGNOSIS — R0602 Shortness of breath: Secondary | ICD-10-CM

## 2019-11-07 DIAGNOSIS — I10 Essential (primary) hypertension: Secondary | ICD-10-CM

## 2019-11-07 DIAGNOSIS — R5383 Other fatigue: Secondary | ICD-10-CM | POA: Diagnosis not present

## 2019-11-07 DIAGNOSIS — F418 Other specified anxiety disorders: Secondary | ICD-10-CM | POA: Diagnosis not present

## 2019-11-07 DIAGNOSIS — Z0289 Encounter for other administrative examinations: Secondary | ICD-10-CM

## 2019-11-07 DIAGNOSIS — E559 Vitamin D deficiency, unspecified: Secondary | ICD-10-CM

## 2019-11-07 DIAGNOSIS — G4733 Obstructive sleep apnea (adult) (pediatric): Secondary | ICD-10-CM

## 2019-11-07 DIAGNOSIS — E119 Type 2 diabetes mellitus without complications: Secondary | ICD-10-CM

## 2019-11-07 DIAGNOSIS — E7849 Other hyperlipidemia: Secondary | ICD-10-CM
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
## 2019-11-07 NOTE — Progress Notes (Signed)
Chief Complaint:   OBESITY Carla Rogers (MR# [id number]) is a 44 y.o. female who presents for evaluation and treatment of obesity and related comorbidities. Current BMI is Body mass index is 43.16 kg/m. Carla Rogers has been struggling with her weight for many years and has been unsuccessful in either losing weight, maintaining weight loss, or reaching her healthy weight goal.  Carla Rogers is status post gastric sleeve in 2016. Her weight before surgery was 250 lbs and decreased to 189 lbs in 1 year. She started regaining weight in several years.  Carla Rogers is currently in the action stage of change and ready to dedicate time achieving and maintaining a healthier weight. [name] is interested in becoming our patient and working on intensive lifestyle modifications including (but not limited to) diet and exercise for weight loss.  Carla Rogers's habits were reviewed today and are as follows: Her family eats meals together, she thinks her family will eat healthier with her, she struggles with family and or coworkers weight loss sabotage, her desired weight loss is 76 lbs, she has been heavy most of her life, she started gaining weight during her marriage, her heaviest weight ever was 293 pounds, she has significant food cravings issues, she snacks frequently in the evenings, she is frequently drinking liquids with calories, she frequently makes poor food choices, she frequently eats larger portions than normal, she has binge eating behaviors and she struggles with emotional eating.  Depression Screen Carla Rogers's Food and Mood (modified PHQ-9) score was 18.  Depression screen PHQ 2/9 11/07/2019  Decreased Interest 2  Down, Depressed, Hopeless 3  PHQ - 2 Score 5  Altered sleeping 3  Tired, decreased energy 2  Change in appetite 2  Feeling bad or failure about yourself  3  Trouble concentrating 3  Moving slowly or fidgety/restless 0  Suicidal thoughts 0  PHQ-9 Score 18  Difficult doing work/chores Somewhat  difficult   Subjective:   1. Other fatigue Carla Rogers admits to daytime somnolence and admits to waking up still tired. Patent has a history of symptoms of daytime fatigue and morning headache. Carla Rogers generally gets 8 hours of sleep per night, and states that she has nightime awakenings. Snoring is present. Apneic episodes are present. Epworth Sleepiness Score is 11.  2. Shortness of breath on exertion Carla Rogers notes increasing shortness of breath with exercising and seems to be worsening over time with weight gain. She notes getting out of breath sooner with activity than she used to. This has not gotten worse recently. Carla Rogers denies shortness of breath at rest or orthopnea.  3. Type 2 diabetes mellitus with other complication, without long-term current use of insulin (Carla Rogers) Carla Rogers is currently on metformin 500 mg BID, and she is tolerating it well.  4. Essential hypertension Carla Rogers was previously on anti-hypertensive medication, titrated off after her gastric bypass in 2016. Her blood pressure is okay at her office visit today.  5. Other hyperlipidemia Carla Rogers is currently on rosuvastatin 20 mg q daily, and she is tolerating it well.  6. Vitamin D deficiency Carla Rogers is currently on Vit D3 125 mcg q daily.  7. Obstructive sleep apnea Carla Rogers reports feeling tired in the morning with morning headache at least once per week.  8. Depression with anxiety Carla Rogers's PHQ-9 score is 18. She is currently on bupropion XL 300 mg q daily, escitalopram 20 mg q daily, and Lorazepam 1 mg as needed.  9. At risk for heart disease Carla Rogers is at a higher than average risk  for cardiovascular disease due to obesity, hyperlipidemia, and diabetes II.   Assessment/Plan:   1. Other fatigue [name] does feel that her weight is causing her energy to be lower than it should be. Fatigue may be related to obesity, depression or many other causes. Labs will be ordered, and in the meanwhile, Carla Rogers will focus on self care  including making healthy food choices, increasing physical activity and focusing on stress reduction.  - EKG 12-Lead - CBC with Differential/Platelet - Folate - T3 - T4, free - TSH - Vitamin B12  2. Shortness of breath on exertion Carla Rogers does feel that she gets out of breath more easily that she used to when she exercises. Carla Rogers's shortness of breath appears to be obesity related and exercise induced. She has agreed to work on weight loss and gradually increase exercise to treat her exercise induced shortness of breath. Will continue to monitor closely.  3. Type 2 diabetes mellitus with other complication, without long-term current use of insulin (HCC) Good blood sugar control is important to decrease the likelihood of diabetic complications such as nephropathy, neuropathy, limb loss, blindness, coronary artery disease, and death. Intensive lifestyle modification including diet, exercise and weight loss are the first line of treatment for diabetes. We will check labs today.  - Hemoglobin A1c - Insulin, random  4. Essential hypertension Carla Rogers is working on healthy weight loss and exercise to improve blood pressure control. We will watch for signs of hypotension as she continues her lifestyle modifications. We will check labs today.  - Comprehensive metabolic panel  5. Other hyperlipidemia Cardiovascular risk and specific lipid/LDL goals reviewed. We discussed several lifestyle modifications today and Carla Rogers will continue to work on diet, exercise and weight loss efforts. We will check labs today. Orders and follow up as documented in patient record.   - Lipid Panel With LDL/HDL Ratio  6. Vitamin D deficiency Low Vitamin D level contributes to fatigue and are associated with obesity, breast, and colon cancer. Carla Rogers will follow-up for routine testing of Vitamin D, at least 2-3 times per year to avoid over-replacement. We will check labs today.  - VITAMIN D 25 Hydroxy (Vit-D  Deficiency, Fractures)  7. Obstructive sleep apnea Carla Rogers is to resume CPAP. Intensive lifestyle modifications are the first line treatment for this issue. We discussed several lifestyle modifications today and she will continue to work on diet, exercise and weight loss efforts. We will continue to monitor. Orders and follow up as documented in patient record.   8. Depression with anxiety Behavior modification techniques were discussed today to help Carla Rogers deal with her emotional/non-hunger eating behaviors. We will refer to Dr. Dewaine Conger, our Bariatric Psychologist for evaluation. Orders and follow up as documented in patient record.   9. At risk for heart disease Carla Rogers was given approximately 30 minutes of coronary artery disease prevention counseling today. She is 44 y.o. female and has risk factors for heart disease including obesity, hyperlipidemia, and diabetes II. We discussed intensive lifestyle modifications today with an emphasis on specific weight loss instructions and strategies.   Repetitive spaced learning was employed today to elicit superior memory formation and behavioral change.  10. Class 3 severe obesity with serious comorbidity and body mass index (BMI) of 40.0 to 44.9 in adult, unspecified obesity type (HCC) Carla Rogers is currently in the action stage of change and her goal is to continue with weight loss efforts. I recommend Carla Rogers begin the structured treatment plan as follows:  She has agreed to the Category 3  Plan.  Exercise goals: No exercise has been prescribed for now, while we concentrate on nutritional changes.   Behavioral modification strategies: increasing lean protein intake, decreasing simple carbohydrates, no skipping meals and meal planning and cooking strategies.  She was informed of the importance of frequent follow-up visits to maximize her success with intensive lifestyle modifications for her multiple health conditions. She was informed we would discuss her  lab results at her next visit unless there is a critical issue that needs to be addressed sooner. Carla Rogers agreed to keep her next visit at the agreed upon time to discuss these results.  Objective:   Blood pressure 135/89, pulse 64, temperature 97.8 F (36.6 C), temperature source Oral, height 5\' 2"  (1.575 m), weight 236 lb (107 kg), last menstrual period 11/06/2019, SpO2 100 %. Body mass index is 43.16 kg/m.  EKG: Normal sinus rhythm, rate 64 BPM.  Indirect Calorimeter completed today shows a VO2 of 281 and a REE of 1957.  Her calculated basal metabolic rate is 01/06/2020 thus her basal metabolic rate is better than expected.  General: Cooperative, alert, well developed, in no acute distress. HEENT: Conjunctivae and lids unremarkable. Cardiovascular: Regular rhythm.  Lungs: Normal work of breathing. Neurologic: No focal deficits.   Lab Results  Component Value Date   CREATININE 0.61 03/18/2015   BUN 15 03/18/2015   NA 136 03/18/2015   K 3.6 03/18/2015   CL 103 03/18/2015   CO2 26 03/18/2015   Lab Results  Component Value Date   ALT 18 03/18/2015   AST 19 03/18/2015   ALKPHOS 46 03/18/2015   BILITOT 0.5 03/18/2015   No results found for: HGBA1C No results found for: INSULIN No results found for: TSH No results found for: CHOL, HDL, LDLCALC, LDLDIRECT, TRIG, CHOLHDL Lab Results  Component Value Date   WBC 8.8 03/26/2015   HGB 11.4 (L) 03/26/2015   HCT 35.6 (L) 03/26/2015   MCV 88.6 03/26/2015   PLT 186 03/26/2015   No results found for: IRON, TIBC, FERRITIN  Attestation Statements:   Reviewed by clinician on day of visit: allergies, medications, problem list, medical history, surgical history, family history, social history, and previous encounter notes.   I, 03/28/2015, am acting as transcriptionist for Burt Knack, MD.  I have reviewed the above documentation for accuracy and completeness, and I agree with the above. - Quillian Quince, MD

## 2019-11-08 LAB — CBC WITH DIFFERENTIAL/PLATELET
Basophils Absolute: 0.1 10*3/uL (ref 0.0–0.2)
Basos: 1 %
EOS (ABSOLUTE): 0.3 10*3/uL (ref 0.0–0.4)
Eos: 4 %
Hematocrit: 43.2 % (ref 34.0–46.6)
Hemoglobin: 14.3 g/dL (ref 11.1–15.9)
Immature Grans (Abs): 0 10*3/uL (ref 0.0–0.1)
Immature Granulocytes: 0 %
Lymphocytes Absolute: 2.3 10*3/uL (ref 0.7–3.1)
Lymphs: 32 %
MCH: 30.2 pg (ref 26.6–33.0)
MCHC: 33.1 g/dL (ref 31.5–35.7)
MCV: 91 fL (ref 79–97)
Monocytes Absolute: 0.5 10*3/uL (ref 0.1–0.9)
Monocytes: 6 %
Neutrophils Absolute: 4.2 10*3/uL (ref 1.4–7.0)
Neutrophils: 57 %
Platelets: 206 10*3/uL (ref 150–450)
RBC: 4.74 x10E6/uL (ref 3.77–5.28)
RDW: 13.2 % (ref 11.7–15.4)
WBC: 7.3 10*3/uL (ref 3.4–10.8)

## 2019-11-08 LAB — COMPREHENSIVE METABOLIC PANEL
ALT: 28 IU/L (ref 0–32)
AST: 26 IU/L (ref 0–40)
Albumin/Globulin Ratio: 2.1 (ref 1.2–2.2)
Albumin: 4.4 g/dL (ref 3.8–4.8)
Alkaline Phosphatase: 58 IU/L (ref 39–117)
BUN/Creatinine Ratio: 12 (ref 9–23)
BUN: 7 mg/dL (ref 6–24)
Bilirubin Total: 0.4 mg/dL (ref 0.0–1.2)
CO2: 24 mmol/L (ref 20–29)
Calcium: 8.9 mg/dL (ref 8.7–10.2)
Chloride: 101 mmol/L (ref 96–106)
Creatinine, Ser: 0.6 mg/dL (ref 0.57–1.00)
GFR calc Af Amer: 127 mL/min/{1.73_m2} (ref >59)
GFR calc non Af Amer: 110 mL/min/{1.73_m2} (ref >59)
Globulin, Total: 2.1 g/dL (ref 1.5–4.5)
Glucose: 99 mg/dL (ref 65–99)
Potassium: 4.1 mmol/L (ref 3.5–5.2)
Sodium: 138 mmol/L (ref 134–144)
Total Protein: 6.5 g/dL (ref 6.0–8.5)

## 2019-11-08 LAB — LIPID PANEL WITH LDL/HDL RATIO
Cholesterol, Total: 199 mg/dL (ref 100–199)
HDL: 71 mg/dL (ref >39)
LDL Chol Calc (NIH): 107 mg/dL — ABNORMAL HIGH (ref 0–99)
LDL/HDL Ratio: 1.5 ratio (ref 0.0–3.2)
Triglycerides: 120 mg/dL (ref 0–149)
VLDL Cholesterol Cal: 21 mg/dL (ref 5–40)

## 2019-11-08 LAB — INSULIN, RANDOM: INSULIN: 6.3 u[IU]/mL (ref 2.6–24.9)

## 2019-11-08 LAB — FOLATE: Folate: 19.5 ng/mL (ref >3.0)

## 2019-11-08 LAB — T4, FREE: Free T4: 1.1 ng/dL (ref 0.82–1.77)

## 2019-11-08 LAB — HEMOGLOBIN A1C
Est. average glucose Bld gHb Est-mCnc: 143 mg/dL
Hgb A1c MFr Bld: 6.6 % — ABNORMAL HIGH (ref 4.8–5.6)

## 2019-11-08 LAB — VITAMIN D 25 HYDROXY (VIT D DEFICIENCY, FRACTURES): Vit D, 25-Hydroxy: 37 ng/mL (ref 30.0–100.0)

## 2019-11-08 LAB — VITAMIN B12: Vitamin B-12: 953 pg/mL (ref 232–1245)

## 2019-11-08 LAB — TSH: TSH: 1.26 u[IU]/mL (ref 0.450–4.500)

## 2019-11-08 LAB — T3: T3, Total: 120 ng/dL (ref 71–180)

## 2019-11-14 ENCOUNTER — Encounter (INDEPENDENT_AMBULATORY_CARE_PROVIDER_SITE_OTHER): Payer: Self-pay | Admitting: Family Medicine

## 2019-11-14 NOTE — Telephone Encounter (Signed)
Please advise 

## 2019-11-18 ENCOUNTER — Telehealth (INDEPENDENT_AMBULATORY_CARE_PROVIDER_SITE_OTHER): Payer: BC Managed Care – PPO | Admitting: Psychology

## 2019-11-18 DIAGNOSIS — F330 Major depressive disorder, recurrent, mild: Secondary | ICD-10-CM | POA: Diagnosis not present

## 2019-11-18 DIAGNOSIS — F5089 Other specified eating disorder: Secondary | ICD-10-CM | POA: Diagnosis not present

## 2019-11-21 ENCOUNTER — Ambulatory Visit (INDEPENDENT_AMBULATORY_CARE_PROVIDER_SITE_OTHER): Payer: BC Managed Care – PPO | Admitting: Family Medicine

## 2019-11-21 ENCOUNTER — Other Ambulatory Visit: Payer: Self-pay

## 2019-11-21 ENCOUNTER — Encounter (INDEPENDENT_AMBULATORY_CARE_PROVIDER_SITE_OTHER): Payer: Self-pay | Admitting: Family Medicine

## 2019-11-21 VITALS — BP 138/74 | HR 81 | Temp 98.5°F | Ht 62.0 in | Wt 230.0 lb

## 2019-11-21 DIAGNOSIS — Z9189 Other specified personal risk factors, not elsewhere classified: Secondary | ICD-10-CM

## 2019-11-21 DIAGNOSIS — E119 Type 2 diabetes mellitus without complications: Secondary | ICD-10-CM

## 2019-11-21 DIAGNOSIS — E559 Vitamin D deficiency, unspecified: Secondary | ICD-10-CM

## 2019-11-21 DIAGNOSIS — Z6841 Body Mass Index (BMI) 40.0 and over, adult: Secondary | ICD-10-CM

## 2019-11-24 NOTE — Progress Notes (Signed)
  Office: [phone]  /  Fax: [phone]    Date: Dec 05, 2019   Appointment Start Time: 10:33am Duration: 26 minutes Provider: Lawerance Cruel, Psy.D. Type of Session: Individual Therapy  Location of Patient: Home Location of Provider: Provider's Home Type of Contact: Telepsychological Visit via MyChart Video Visit  Session Content: Carla Rogers is a 44 y.o. female presenting via MyChart Video Visit for a follow-up appointment to address the previously established treatment goal of increasing coping skills. Today's appointment was a telepsychological visit due to COVID-19. Carla Rogers provided verbal consent for today's telepsychological appointment and she is aware she is responsible for securing confidentiality on her end of the session. Prior to proceeding with today's appointment, Carla Rogers's physical location at the time of this appointment was obtained as well a phone number she could be reached at in the event of technical difficulties. Carla Rogers and this provider participated in today's telepsychological service. Of note, this provider had to re-join today's appointment at 10:53am due to connection issues.   This provider conducted a brief check-in. Carla Rogers reported a desire to focus on "getting back on track." Recent eating habits were explored. Psychoeducation regarding SMART goals was provided and Carla Rogers was engaged in goal setting. The following goal was established: Carla Rogers will eat dinner congruent to her meal plan at least 4 out of 7 days a week between now and the next appointment with this provider. Carla Rogers was receptive to today's appointment as evidenced by openness to sharing, responsiveness to feedback, and willingness to work toward the established SMART goal.  Mental Status Examination:  Appearance: well groomed and appropriate hygiene  Behavior: appropriate to circumstances Mood: euthymic Affect: mood congruent Speech: normal in rate, volume, and tone Eye Contact:  appropriate Psychomotor Activity: appropriate Gait: unable to assess Thought Process: linear, logical, and goal directed  Thought Content/Perception: no hallucinations, delusions, bizarre thinking or behavior reported or observed and no evidence of suicidal and homicidal ideation, plan, and intent Orientation: time, person, place and purpose of appointment Memory/Concentration: memory, attention, language, and fund of knowledge intact  Insight/Judgment: good  Interventions:  Conducted a brief chart review Provided empathic reflections and validation Employed supportive psychotherapy interventions to facilitate reduced distress and to improve coping skills with identified stressors Employed motivational interviewing skills to assess patient's willingness/desire to adhere to recommended medical treatments and assignments Engaged patient in goal setting Psychoeducation provided regarding SMART goals  DSM-5 Diagnosis(es): 307.59 (F50.8) Other Specified Feeding or Eating Disorder, Emotional Eating Behaviors and 296.31 (F33.0) Major Depressive Disorder, Recurrent Episode, Mild, With Anxious Distress  Treatment Goal & Progress: During the initial appointment with this provider, the following treatment goal was established: increase coping skills. Progress is limited, as Carla Rogers has just begun treatment with this provider; however, she is receptive to the interaction and interventions and rapport is being established.   Plan: The next appointment will be scheduled in approximately two weeks, which will be via MyChart Video Visit. The next session will focus on working towards the established treatment goal.

## 2019-11-25 MED ORDER — VITAMIN D (ERGOCALCIFEROL) 1.25 MG (50000 UNIT) PO CAPS: 50000.0000 [IU] | ORAL_CAPSULE | ORAL | 0 refills | Status: DC

## 2019-11-26 NOTE — Progress Notes (Signed)
Chief Complaint:   OBESITY Carla Rogers is here to discuss her progress with her obesity treatment plan along with follow-up of her obesity related diagnoses. Carla Rogers is on the Category 3 Plan and states she is following her eating plan approximately 75% of the time. Carla Rogers states she is doing 0 minutes 0 times per week.  Today's visit was #: 2 Starting weight: 236 lbs Starting date: 11/07/2019 Today's weight: 230 lbs Today's date: 11/21/2019 Total lbs lost to date: 6 Total lbs lost since last in-office visit: 6  Interim History: Carla Rogers is status post weight loss surgery. She has done very well with weight loss on her Category 3 plan. She struggled to eat all of her protein especially at dinner. Her hunger was controlled.  Subjective:   1. Vitamin D deficiency Carla Rogers is on Vit D OTC, but her level is not yet at goal. She notes fatigue. I discussed labs with the patient today.  2. Type 2 diabetes mellitus with complication, without long-term current use of insulin (HCC) Carla Rogers A1c is worsening and starting to slowly increase. She is on metformin and denies hypoglycemia. I discussed labs with the patient today.  3. At risk for hypoglycemia Carla Rogers is at increased risk for hypoglycemia due to weight loss.  Assessment/Plan:   1. Vitamin D deficiency Low Vitamin D level contributes to fatigue and are associated with obesity, breast, and colon cancer. Carla Rogers agreed to start prescription Vitamin D 50,000 IU every week with no refills. She will follow-up for routine testing of Vitamin D, at least 2-3 times per year to avoid over-replacement. We will recheck labs in 3 months.  - Vitamin D, Ergocalciferol, (DRISDOL) 1.25 MG (50000 UNIT) CAPS capsule; Take 1 capsule (50,000 Units total) by mouth every 7 (seven) days.  Dispense: 4 capsule; Refill: 0  2. Type 2 diabetes mellitus with complication, without long-term current use of insulin ([place]) Carla Rogers will continue metformin and diet. Good blood  sugar control is important to decrease the likelihood of diabetic complications such as nephropathy, neuropathy, limb loss, blindness, coronary artery disease, and death. Intensive lifestyle modification including diet, exercise and weight loss are the first line of treatment for diabetes. We will recheck labs in 3 months.  3. At risk for hypoglycemia Carla Rogers was given approximately 30 minutes of counseling today regarding prevention of hypoglycemia. She was advised of symptoms of hypoglycemia. We will continue to monitor, and may need to decrease medications as she loses weight. Carla Rogers was instructed to avoid skipping meals, eat regular protein rich meals and schedule low calorie snacks as needed.   Repetitive spaced learning was employed today to elicit superior memory formation and behavioral change  4. Class 3 severe obesity with serious comorbidity and body mass index (BMI) of 40.0 to 44.9 in adult, unspecified obesity type (HCC) Carla Rogers is currently in the action stage of change. As such, her goal is to continue with weight loss efforts. She has agreed to the Category 3 Plan with breakfast options and lean meat equivalents.   Behavioral modification strategies: increasing lean protein intake, no skipping meals and better snacking choices.  Carla Rogers has agreed to follow-up with our clinic in 2 weeks. She was informed of the importance of frequent follow-up visits to maximize her success with intensive lifestyle modifications for her multiple health conditions.   Objective:   Blood pressure 138/74, pulse 81, temperature 98.5 F (36.9 C), temperature source Oral, height 5\' 2"  (1.575 m), weight 230 lb (104.3 kg), last menstrual period 11/06/2019,  SpO2 97 %. Body mass index is 42.07 kg/m.  General: Cooperative, alert, well developed, in no acute distress. HEENT: Conjunctivae and lids unremarkable. Cardiovascular: Regular rhythm.  Lungs: Normal work of breathing. Neurologic: No focal deficits.     Lab Results  Component Value Date   CREATININE 0.60 11/07/2019   BUN 7 11/07/2019   NA 138 11/07/2019   K 4.1 11/07/2019   CL 101 11/07/2019   CO2 24 11/07/2019   Lab Results  Component Value Date   ALT 28 11/07/2019   AST 26 11/07/2019   ALKPHOS 58 11/07/2019   BILITOT 0.4 11/07/2019   Lab Results  Component Value Date   HGBA1C 6.6 (H) 11/07/2019   Lab Results  Component Value Date   INSULIN 6.3 11/07/2019   Lab Results  Component Value Date   TSH 1.260 11/07/2019   Lab Results  Component Value Date   CHOL 199 11/07/2019   HDL 71 11/07/2019   LDLCALC 107 (H) 11/07/2019   TRIG 120 11/07/2019   Lab Results  Component Value Date   WBC 7.3 11/07/2019   HGB 14.3 11/07/2019   HCT 43.2 11/07/2019   MCV 91 11/07/2019   PLT 206 11/07/2019   No results found for: IRON, TIBC, FERRITIN  Attestation Statements:   Reviewed by clinician on day of visit: allergies, medications, problem list, medical history, surgical history, family history, social history, and previous encounter notes.   I, Burt Knack, am acting as transcriptionist for Quillian Quince, MD.  I have reviewed the above documentation for accuracy and completeness, and I agree with the above. -  Quillian Quince, MD

## 2019-12-05 ENCOUNTER — Other Ambulatory Visit: Payer: Self-pay

## 2019-12-05 ENCOUNTER — Telehealth (INDEPENDENT_AMBULATORY_CARE_PROVIDER_SITE_OTHER): Payer: BC Managed Care – PPO | Admitting: Psychology

## 2019-12-05 DIAGNOSIS — F5089 Other specified eating disorder: Secondary | ICD-10-CM

## 2019-12-05 DIAGNOSIS — F330 Major depressive disorder, recurrent, mild: Secondary | ICD-10-CM | POA: Diagnosis not present

## 2019-12-05 NOTE — Progress Notes (Signed)
  Office: [phone]  /  Fax: [phone]    Date: Dec 17, 2019   Appointment Start Time: 8:29am Duration: 31 minutes Provider: Lawerance Cruel, Psy.D. Type of Session: Individual Therapy  Location of Patient: Home Location of Provider: Provider's Home Type of Contact: Telepsychological Visit via MyChart Video Visit  Session Content: Carla Rogers is a 44 y.o. female presenting via MyChart Video Visit for a follow-up appointment to address the previously established treatment goal of increasing coping skills. Today's appointment was a telepsychological visit due to COVID-19. Carla Rogers provided verbal consent for today's telepsychological appointment and she is aware she is responsible for securing confidentiality on her end of the session. Prior to proceeding with today's appointment, Carla Rogers's physical location at the time of this appointment was obtained as well a phone number she could be reached at in the event of technical difficulties. Carla Rogers and this provider participated in today's telepsychological service.   This provider conducted a brief check-in. Carla Rogers reported she continues to experience challenges with eating, noting she discussed the challenges with Dr. Dalbert Garnet and was switched to journaling. Strategies for planning/prepping ahead (e.g., hard boiled eggs for multiple days, having a working plan for the day) were discussed. The previously established SMART goal for dinner was reviewed. She believes switching to journaling made it challenging to reach the goal. Thus, the following SMART goal was established: Carla Rogers will journal for the entire day at least 3 out of 7 days a week between now and next appointment. Carla Rogers was receptive to today's appointment as evidenced by openness to sharing, responsiveness to feedback, and willingness to implement discussed strategies .  Mental Status Examination:  Appearance: well groomed and appropriate hygiene  Behavior: appropriate to circumstances Mood:  euthymic Affect: mood congruent Speech: normal in rate, volume, and tone Eye Contact: appropriate Psychomotor Activity: appropriate Gait: unable to assess Thought Process: linear, logical, and goal directed  Thought Content/Perception: no hallucinations, delusions, bizarre thinking or behavior reported or observed and no evidence of suicidal and homicidal ideation, plan, and intent Orientation: time, person, place and purpose of appointment Memory/Concentration: memory, attention, language, and fund of knowledge intact  Insight/Judgment: good  Interventions:  Conducted a brief chart review Provided empathic reflections and validation Reviewed content from the previous session Employed supportive psychotherapy interventions to facilitate reduced distress and to improve coping skills with identified stressors Employed motivational interviewing skills to assess patient's willingness/desire to adhere to recommended medical treatments and assignments Engaged patient in goal setting  DSM-5 Diagnosis(es): 307.59 (F50.8) Other Specified Feeding or Eating Disorder, Emotional Eating Behaviors and 296.31 (F33.0) Major Depressive Disorder, Recurrent Episode, Mild, With Anxious Distress  Treatment Goal & Progress: During the initial appointment with this provider, the following treatment goal was established: increase coping skills. Carla Rogers has demonstrated progress in her goal as evidenced by increased awareness of hunger patterns. Carla Rogers also continues to demonstrate willingness to engage in learned skill(s).  Plan: The next appointment will be scheduled in two weeks, which will be via MyChart Video Visit. The next session will focus on working towards the established treatment goal.

## 2019-12-11 ENCOUNTER — Encounter (INDEPENDENT_AMBULATORY_CARE_PROVIDER_SITE_OTHER): Payer: Self-pay | Admitting: Family Medicine

## 2019-12-11 ENCOUNTER — Ambulatory Visit (INDEPENDENT_AMBULATORY_CARE_PROVIDER_SITE_OTHER): Payer: BC Managed Care – PPO | Admitting: Family Medicine

## 2019-12-11 ENCOUNTER — Other Ambulatory Visit: Payer: Self-pay

## 2019-12-11 VITALS — BP 148/84 | HR 75 | Temp 98.5°F | Ht 62.0 in | Wt 229.0 lb

## 2019-12-11 DIAGNOSIS — Z6841 Body Mass Index (BMI) 40.0 and over, adult: Secondary | ICD-10-CM

## 2019-12-11 DIAGNOSIS — Z9189 Other specified personal risk factors, not elsewhere classified: Secondary | ICD-10-CM | POA: Diagnosis not present

## 2019-12-11 DIAGNOSIS — E559 Vitamin D deficiency, unspecified: Secondary | ICD-10-CM | POA: Diagnosis not present

## 2019-12-11 DIAGNOSIS — E119 Type 2 diabetes mellitus without complications: Secondary | ICD-10-CM

## 2019-12-11 DIAGNOSIS — E66813 Obesity, class 3: Secondary | ICD-10-CM

## 2019-12-11 DIAGNOSIS — E7849 Other hyperlipidemia: Secondary | ICD-10-CM

## 2019-12-11 MED ORDER — VITAMIN D (ERGOCALCIFEROL) 1.25 MG (50000 UNIT) PO CAPS: 50000.0000 [IU] | ORAL_CAPSULE | ORAL | 0 refills | Status: DC

## 2019-12-11 NOTE — Progress Notes (Signed)
Chief Complaint:   OBESITY Carla Rogers is here to discuss her progress with her obesity treatment plan along with follow-up of her obesity related diagnoses. Carla Rogers is on the Category 3 Plan with breakfast options and lean meat equivalents and states she is following her eating plan approximately 40% of the time. Carla Rogers states she is doing 0 minutes 0 times per week.  Today's visit was #: 3 Starting weight: 236 lbs Starting date: 11/07/2019 Today's weight: 229 lbs Today's date: 12/11/2019 Total lbs lost to date: 7 Total lbs lost since last in-office visit: 1  Interim History: Carla Rogers was acutely ill a week and a half ago and she had to rely on her husband for meals, which were mostly fast food options. She is frustrated with limited food options on the Category 3 meal plan, and she states she needs to cook more for herself and for her family.  Subjective:   1. Type 2 diabetes mellitus without complication, without long-term current use of insulin (HCC) Carla Rogers's A1c on 11/07/2019 was 6.6 and insulin level 6.3. She is on metformin 500 mg BID, and she is tolerating it well.  2. Other hyperlipidemia Carla Rogers's lipid panel on 11/07/2019, showed a total cholesterol of 199, triglycerides 120, HDL 71, and LDL 107. She is on atorvastatin 20 mg q daily, and is tolerating it well.  3. Vitamin D deficiency Carla Rogers's Vit D level on 11/07/2019 was 37. She is on prescription strength Vit D supplementation, and is tolerating it well.  4. At risk for osteoporosis Carla Rogers is at higher risk of osteopenia and osteoporosis due to Vitamin D deficiency and obesity.   Assessment/Plan:   1. Type 2 diabetes mellitus without complication, without long-term current use of insulin (HCC) Good blood sugar control is important to decrease the likelihood of diabetic complications such as nephropathy, neuropathy, limb loss, blindness, coronary artery disease, and death. Intensive lifestyle modification including diet, exercise  and weight loss are the first line of treatment for diabetes. Kelie will start her journaling meal plan, and we will recheck labs in 3 months.  2. Other hyperlipidemia Cardiovascular risk and specific lipid/LDL goals reviewed. We discussed several lifestyle modifications today. Carla Rogers will continue her statin therapy, and will start her journaling meal plan. She will continue to work on exercise and weight loss efforts. Orders and follow up as documented in patient record.   3. Vitamin D deficiency Low Vitamin D level contributes to fatigue and are associated with obesity, breast, and colon cancer. We will refill prescription Vitamin D for 1 month. Carla Rogers will follow-up for routine testing of Vitamin D, at least 2-3 times per year to avoid over-replacement.  - Vitamin D, Ergocalciferol, (DRISDOL) 1.25 MG (50000 UNIT) CAPS capsule; Take 1 capsule (50,000 Units total) by mouth every 7 (seven) days.  Dispense: 4 capsule; Refill: 0  4. At risk for osteoporosis Carla Rogers was given approximately 15 minutes of osteoporosis prevention counseling today. Carla Rogers is at risk for osteopenia and osteoporosis due to her Vitamin D deficiency and obesity. She was encouraged to take her Vitamin D and follow her higher calcium diet and increase strengthening exercise to help strengthen her bones and decrease her risk of osteopenia and osteoporosis.  Repetitive spaced learning was employed today to elicit superior memory formation and behavioral change.  5. Class 3 severe obesity with serious comorbidity and body mass index (BMI) of 40.0 to 44.9 in adult, unspecified obesity type (Carla Rogers) Carla Rogers is currently in the action stage of change. As such,  her goal is to continue with weight loss efforts. She has agreed to keeping a food journal and adhering to recommended goals of 1400-1600 calories and 85+ grams of protein daily.   Exercise goals: No exercise has been prescribed at this time.  Behavioral modification strategies:  increasing lean protein intake, decreasing simple carbohydrates, decreasing eating out, no skipping meals, meal planning and cooking strategies and celebration eating strategies.  Carla Rogers has agreed to follow-up with our clinic in 2 weeks. She was informed of the importance of frequent follow-up visits to maximize her success with intensive lifestyle modifications for her multiple health conditions.   Objective:   Blood pressure (!) 148/84, pulse 75, temperature 98.5 F (36.9 C), temperature source Oral, height 5\' 2"  (1.575 m), weight 229 lb (103.9 kg), SpO2 100 %. Body mass index is 41.88 kg/m.  General: Cooperative, alert, well developed, in no acute distress. HEENT: Conjunctivae and lids unremarkable. Cardiovascular: Regular rhythm.  Lungs: Normal work of breathing. Neurologic: No focal deficits.   Lab Results  Component Value Date   CREATININE 0.60 11/07/2019   BUN 7 11/07/2019   NA 138 11/07/2019   K 4.1 11/07/2019   CL 101 11/07/2019   CO2 24 11/07/2019   Lab Results  Component Value Date   ALT 28 11/07/2019   AST 26 11/07/2019   ALKPHOS 58 11/07/2019   BILITOT 0.4 11/07/2019   Lab Results  Component Value Date   HGBA1C 6.6 (H) 11/07/2019   Lab Results  Component Value Date   INSULIN 6.3 11/07/2019   Lab Results  Component Value Date   TSH 1.260 11/07/2019   Lab Results  Component Value Date   CHOL 199 11/07/2019   HDL 71 11/07/2019   LDLCALC 107 (H) 11/07/2019   TRIG 120 11/07/2019   Lab Results  Component Value Date   WBC 7.3 11/07/2019   HGB 14.3 11/07/2019   HCT 43.2 11/07/2019   MCV 91 11/07/2019   PLT 206 11/07/2019   No results found for: IRON, TIBC, FERRITIN  Attestation Statements:   Reviewed by clinician on day of visit: allergies, medications, problem list, medical history, surgical history, family history, social history, and previous encounter notes.   I, 01/07/2020, am acting as transcriptionist for Burt Knack, MD.  I have  reviewed the above documentation for accuracy and completeness, and I agree with the above. -  Quillian Quince, MD

## 2019-12-17 ENCOUNTER — Telehealth (INDEPENDENT_AMBULATORY_CARE_PROVIDER_SITE_OTHER): Payer: BC Managed Care – PPO | Admitting: Psychology

## 2019-12-17 DIAGNOSIS — F5089 Other specified eating disorder: Secondary | ICD-10-CM | POA: Diagnosis not present

## 2019-12-17 DIAGNOSIS — F330 Major depressive disorder, recurrent, mild: Secondary | ICD-10-CM | POA: Diagnosis not present

## 2019-12-17 NOTE — Progress Notes (Unsigned)
Office: [phone]  /  Fax: [phone]    Date: December 31, 2019   Appointment Start Time: *** Duration: *** minutes Provider: Glennie Isle, Psy.D. Type of Session: Individual Therapy  Location of Patient: {gbptloc:23249} Location of Provider: Provider's Home Type of Contact: Telepsychological Visit via MyChart Video Visit  Session Content: Carla Rogers is a 44 y.o. female presenting via [place] Visit for a follow-up appointment to address the previously established treatment goal of increasing coping skills. Today's appointment was a telepsychological visit due to COVID-19. Carla Rogers provided verbal consent for today's telepsychological appointment and she is aware she is responsible for securing confidentiality on her end of the session. Prior to proceeding with today's appointment, Carla Rogers's physical location at the time of this appointment was obtained as well a phone number she could be reached at in the event of technical difficulties. Carla Rogers and this provider participated in today's telepsychological service.   This provider conducted a brief check-in and verbally administered the PHQ-9 and GAD-7. *** Carla Rogers was receptive to today's appointment as evidenced by openness to sharing, responsiveness to feedback, and {gbreceptiveness:23401}.  Mental Status Examination:  Appearance: {Appearance:22431} Behavior: {Behavior:22445} Mood: {gbmood:21757} Affect: {Affect:22436} Speech: {Speech:22432} Eye Contact: {Eye Contact:22433} Psychomotor Activity: {Motor Activity:22434} Gait: {gbgait:23404} Thought Process: {thought process:22448}  Thought Content/Perception: {disturbances:22451} Orientation: {Orientation:22437} Memory/Concentration: {gbcognition:22449} Insight/Judgment: {Insight:22446}  Structured Assessments Results: The Patient Health Questionnaire-9 (PHQ-9) is a self-report measure that assesses symptoms and severity of depression over the course of the last two weeks. Carla Rogers  obtained a score of *** suggesting {GBPHQ9SEVERITY:21752}. Carla Rogers finds the endorsed symptoms to be {gbphq9difficulty:21754}. [0= Not at all; 1= Several days; 2= More than half the days; 3= Nearly every day] Little interest or pleasure in doing things ***  Feeling down, depressed, or hopeless ***  Trouble falling or staying asleep, or sleeping too much ***  Feeling tired or having little energy ***  Poor appetite or overeating ***  Feeling bad about yourself --- or that you are a failure or have let yourself or your family down ***  Trouble concentrating on things, such as reading the newspaper or watching television ***  Moving or speaking so slowly that other people could have noticed? Or the opposite --- being so fidgety or restless that you have been moving around a lot more than usual ***  Thoughts that you would be better off dead or hurting yourself in some way ***  PHQ-9 Score ***    The Generalized Anxiety Disorder-7 (GAD-7) is a brief self-report measure that assesses symptoms of anxiety over the course of the last two weeks. Carla Rogers obtained a score of *** suggesting {gbgad7severity:21753}. Carla Rogers finds the endorsed symptoms to be {gbphq9difficulty:21754}. [0= Not at all; 1= Several days; 2= Over half the days; 3= Nearly every day] Feeling nervous, anxious, on edge ***  Not being able to stop or control worrying ***  Worrying too much about different things ***  Trouble relaxing ***  Being so restless that it's hard to sit still ***  Becoming easily annoyed or irritable ***  Feeling afraid as if something awful might happen ***  GAD-7 Score ***   Interventions:  {Interventions for Progress Notes:23405}  DSM-5 Diagnosis(es): 307.59 (F50.8) Other Specified Feeding or Eating Disorder, Emotional Eating Behaviors and 296.31 (F33.0) Major Depressive Disorder, Recurrent Episode, Mild, With Anxious Distress  Treatment Goal & Progress: During the initial appointment with this provider,  the following treatment goal was established: increase coping skills. Carla Rogers has demonstrated progress in her goal as evidenced  by {gbtxprogress:22839}. Carla Rogers also {gbtxprogress2:22951}.  Plan: The next appointment will be scheduled in {gbweeks:21758}, which will be {gbtxmodality:23402}. The next session will focus on {Plan for Next Appointment:23400}.

## 2019-12-25 ENCOUNTER — Ambulatory Visit (INDEPENDENT_AMBULATORY_CARE_PROVIDER_SITE_OTHER): Payer: BC Managed Care – PPO | Admitting: Bariatrics

## 2019-12-31 ENCOUNTER — Telehealth (INDEPENDENT_AMBULATORY_CARE_PROVIDER_SITE_OTHER): Payer: Self-pay | Admitting: Psychology

## 2020-04-29 ENCOUNTER — Other Ambulatory Visit: Payer: Self-pay | Admitting: Specialist

## 2020-04-29 DIAGNOSIS — M25562 Pain in left knee: Secondary | ICD-10-CM

## 2020-04-29 DIAGNOSIS — M25561 Pain in right knee: Secondary | ICD-10-CM

## 2020-05-24 ENCOUNTER — Ambulatory Visit
Admission: RE | Admit: 2020-05-24 | Discharge: 2020-05-24 | Disposition: A | Discharge status: Home / Self Care | Payer: BC Managed Care – PPO | Source: Ambulatory Visit | Attending: Specialist | Admitting: Specialist

## 2020-05-24 ENCOUNTER — Other Ambulatory Visit: Payer: Self-pay

## 2020-05-24 DIAGNOSIS — M25561 Pain in right knee: Secondary | ICD-10-CM

## 2020-05-24 DIAGNOSIS — M25562 Pain in left knee: Secondary | ICD-10-CM

## 2020-06-29 ENCOUNTER — Ambulatory Visit: Payer: BC Managed Care – PPO | Attending: Specialist | Admitting: Physical Therapy

## 2020-06-29 ENCOUNTER — Other Ambulatory Visit: Payer: Self-pay

## 2020-06-29 DIAGNOSIS — M545 Low back pain, unspecified: Secondary | ICD-10-CM | POA: Diagnosis present

## 2020-06-29 DIAGNOSIS — G8929 Other chronic pain: Secondary | ICD-10-CM | POA: Insufficient documentation

## 2020-06-29 NOTE — Therapy (Signed)
[facility] [address] [place], [place], [address] Phone: [phone]   Fax:  [phone]  Physical Therapy Evaluation  Patient Details  Name: Carla Rogers MRN: [id number] Date of Birth: Aug 29, 1975 Referring Provider (PT): Eugenia Mcalpine MD   Encounter Date: 06/29/2020   PT End of Session - 06/29/20 0842    Visit Number 1    Number of Visits 12    Date for PT Re-Evaluation 07/27/20    Authorization Type FOTO.    PT Start Time 0820    PT Stop Time 0906    PT Time Calculation (min) 46 min    Activity Tolerance Patient tolerated treatment well    Behavior During Therapy [facility] for tasks assessed/performed           Past Medical History:  Diagnosis Date  . ADHD   . Anxiety   . Back pain   . Complication of anesthesia    slow to wake up   . Depression   . Diabetes mellitus without complication (HCC)   . GERD (gastroesophageal reflux disease)   . Hyperlipidemia   . Hypertension   . Joint pain   . Knee pain   . Multiple food allergies    (red onions- Headaches)  . Osteoarthritis   . Overactive bladder   . Palpitations   . PONV (postoperative nausea and vomiting)   . Sleep apnea    cpap   . Vitamin D deficiency     Past Surgical History:  Procedure Laterality Date  . CHOLECYSTECTOMY    . COLONOSCOPY    . exploratory vaginal surgery     . LAPAROSCOPIC GASTRIC SLEEVE RESECTION N/A 03/24/2015   Procedure: LAPAROSCOPIC GASTRIC SLEEVE RESECTION UPPER ENDOSCOPY;  Surgeon: Ovidio Kin, MD;  Location: [facility];  Service: General;  Laterality: N/A;  . LAPAROSCOPY    . pilonidal cyst removal     . UPPER GI ENDOSCOPY  03/24/2015   Procedure: UPPER GI ENDOSCOPY;  Surgeon: Ovidio Kin, MD;  Location: [facility];  Service: General;;    There were no vitals filed for this visit.    Subjective Assessment - 06/29/20 0843    Subjective COVID-19 screen performed prior to patient entering clinic. The patoient reports ongoing low back  pain greater on left than right.  Her pain is an 8/10 today.  Rest and massage decreases her pain.  Increased activity increases her pain.    Pertinent History Knee surgery, HTN, DM, OA.    How long can you sit comfortably? Varies.    How long can you stand comfortably? Varies.    How long can you walk comfortably? Varies.    Patient Stated Goals Reduce pain and be able to do more with less pain.    Currently in Pain? Yes    Pain Score 8     Pain Location Back    Pain Orientation Left    Pain Descriptors / Indicators Aching;Sore   Tight.   Pain Type Chronic pain    Pain Onset More than a month ago    Pain Frequency Constant    Aggravating Factors  See above.    Pain Relieving Factors See above.              [facility] PT Assessment - 06/29/20 0001      Assessment   Medical Diagnosis Low back pain.    Referring Provider (PT) Eugenia Mcalpine MD    Onset Date/Surgical Date --   Many years.  Precautions   Precautions None      Restrictions   Weight Bearing Restrictions No      Balance Screen   Has the patient fallen in the past 6 months No    Has the patient had a decrease in activity level because of a fear of falling?  No    Is the patient reluctant to leave their home because of a fear of falling?  No      Home Tourist information centre manager residence      Prior Function   Level of Independence Independent      Posture/Postural Control   Posture/Postural Control No significant limitations      Deep Tendon Reflexes   DTR Assessment Site Patella;Achilles    Patella DTR 2+    Achilles DTR --   RT is 2+/4+; Left is diminished.     ROM / Strength   AROM / PROM / Strength AROM;Strength      AROM   Overall AROM Comments Normal active lumbar AROM.      Strength   Overall Strength Comments Normal LE strength.      Palpation   Palpation comment Very tender to palpation over sacral ligaments left > right.      Special Tests   Other special tests Equal leg  lengths, (-) SLR testing, (-) FABER and Sacral Press test.      Ambulation/Gait   Gait Comments Essentially normal.                      Objective measurements completed on examination: See above findings.       [facility] Adult PT Treatment/Exercise - 06/29/20 0001      Modalities   Modalities Electrical Stimulation;Moist Heat      Moist Heat Therapy   Number Minutes Moist Heat 15 Minutes    Moist Heat Location Lumbar Spine      Electrical Stimulation   Electrical Stimulation Location Low back.    Electrical Stimulation Action IFC    Electrical Stimulation Parameters 80-150 Hz on 40% scan x 15 minutes.    Electrical Stimulation Goals Pain                       PT Long Term Goals - 06/29/20 1696      PT LONG TERM GOAL #1   Title Ind with a HEP.    Time 4    Period Weeks    Status New      PT LONG TERM GOAL #2   Title Perform ADL's with pain not > 3/10.    Time 4    Period Weeks    Status New                  Plan - 06/29/20 7893    Clinical Impression Statement The patient presents to OPP[facility] with c/o chronic low back pain left > right.  She was very tender to palpation over her left SIJ and alsoto a lesser degree on her right.  She has excellent spinal range of motion.  Her LE strength is normal but her left Achilles reflex is diminished.  She has in a lot of pain today and her FOTO score is a 68% limitation.  will benefit from skilled physical therapy intervention to address deficits and pain.    Personal Factors and Comorbidities Comorbidity 1;Comorbidity 2    Comorbidities Knee surgery, HTN, DM, OA.    Examination-Activity  Limitations Other    Examination-Participation Restrictions Other;Yard Work    Public house manager Low    Rehab Potential Good    PT Frequency 2x / week    PT Duration 6 weeks    PT Treatment/Interventions ADLs/Self Care Home  Management;Cryotherapy;Electrical Stimulation;Ultrasound;Traction;Moist Heat;Functional mobility training;Therapeutic activities;Therapeutic exercise;Manual techniques;Patient/family education;Passive range of motion;Dry needling;Joint Manipulations;Spinal Manipulations    PT Next Visit Plan Int traction at 30# body weight, Combo e'stim/US and STW/M to bilateral L-S regions, back stabilization exercises.    Consulted and Agree with Plan of Care Patient           Patient will benefit from skilled therapeutic intervention in order to improve the following deficits and impairments:  Decreased activity tolerance  Visit Diagnosis: Chronic left-sided low back pain without sciatica - Plan: PT plan of care cert/re-cert     Problem List Patient Active Problem List   Diagnosis Date Noted  . Morbid obesity (HCC) 03/24/2015    Anjoli Diemer, [place] MPT 06/29/2020, 9:30 AM  [facility] [address] [place], [place], [address] Phone: [phone]   Fax:  [phone]  Name: GUDRUN AXE MRN: [id number] Date of Birth: 1976/02/12

## 2020-07-01 ENCOUNTER — Ambulatory Visit: Payer: BC Managed Care – PPO | Admitting: Physical Therapy

## 2020-07-02 ENCOUNTER — Ambulatory Visit: Payer: BC Managed Care – PPO | Attending: Specialist | Admitting: *Deleted

## 2020-07-02 ENCOUNTER — Other Ambulatory Visit: Payer: Self-pay

## 2020-07-02 DIAGNOSIS — M545 Low back pain, unspecified: Secondary | ICD-10-CM | POA: Diagnosis not present

## 2020-07-02 DIAGNOSIS — G8929 Other chronic pain: Secondary | ICD-10-CM | POA: Insufficient documentation

## 2020-07-02 NOTE — Therapy (Signed)
[facility] [address] [place], [place], [address] Phone: [phone]   Fax:  [phone]  Physical Therapy Treatment  Patient Details  Name: MEKAYLA SOMAN MRN: [id number] Date of Birth: Nov 23, 1975 Referring Provider (PT): Eugenia Mcalpine MD   Encounter Date: 07/02/2020   PT End of Session - 07/02/20 1157    Visit Number 2    Number of Visits 12    Date for PT Re-Evaluation 07/27/20    Authorization Type FOTO.    PT Start Time 0945    PT Stop Time 1035    PT Time Calculation (min) 50 min           Past Medical History:  Diagnosis Date  . ADHD   . Anxiety   . Back pain   . Complication of anesthesia    slow to wake up   . Depression   . Diabetes mellitus without complication (HCC)   . GERD (gastroesophageal reflux disease)   . Hyperlipidemia   . Hypertension   . Joint pain   . Knee pain   . Multiple food allergies    (red onions- Headaches)  . Osteoarthritis   . Overactive bladder   . Palpitations   . PONV (postoperative nausea and vomiting)   . Sleep apnea    cpap   . Vitamin D deficiency     Past Surgical History:  Procedure Laterality Date  . CHOLECYSTECTOMY    . COLONOSCOPY    . exploratory vaginal surgery     . LAPAROSCOPIC GASTRIC SLEEVE RESECTION N/A 03/24/2015   Procedure: LAPAROSCOPIC GASTRIC SLEEVE RESECTION UPPER ENDOSCOPY;  Surgeon: Ovidio Kin, MD;  Location: [facility];  Service: General;  Laterality: N/A;  . LAPAROSCOPY    . pilonidal cyst removal     . UPPER GI ENDOSCOPY  03/24/2015   Procedure: UPPER GI ENDOSCOPY;  Surgeon: Ovidio Kin, MD;  Location: [facility];  Service: General;;    There were no vitals filed for this visit.   Subjective Assessment - 07/02/20 0945    Subjective COVID-19 screen performed prior to patient entering clinic. LBP today 2/10 today. Both legs get achey in the quads and in the calves lateral aspect, but is intermittent.    Pertinent History Knee surgery, HTN, DM, OA.     How long can you sit comfortably? Varies.    How long can you stand comfortably? Varies.    How long can you walk comfortably? Varies.    Patient Stated Goals Reduce pain and be able to do more with less pain.    Currently in Pain? Yes    Pain Score 2     Pain Location Back    Pain Orientation Left    Pain Descriptors / Indicators Aching;Sore    Pain Type Chronic pain                             [facility] Adult PT Treatment/Exercise - 07/02/20 0001      Exercises   Exercises Lumbar;Knee/Hip      Lumbar Exercises: Seated   Other Seated Lumbar Exercises ant./ post pelvic tilts x 10   SOC      Lumbar Exercises: Supine   Ab Set 20 reps;5 seconds    Bent Knee Raise 20 reps;2 seconds    Bridge 20 reps;3 seconds    Other Supine Lumbar Exercises seated: pt instructed in drawin and march in sitting and standing as well  Lumbar Exercises: Prone   Straight Leg Raise 10 reps      Modalities   Modalities Electrical Stimulation;Moist Heat;Traction      Traction   Type of Traction Lumbar    Min (lbs) 10    Max (lbs) 69    Hold Time 99    Rest Time 5    Time 15                       PT Long Term Goals - 06/29/20 2229      PT LONG TERM GOAL #1   Title Ind with a HEP.    Time 4    Period Weeks    Status New      PT LONG TERM GOAL #2   Title Perform ADL's with pain not > 3/10.    Time 4    Period Weeks    Status New                 Plan - 07/02/20 1043    Clinical Impression Statement Pt arrived today doing better with lower pain levels in her LB. Rx focused on core activation and posture  education today f/b pelvic traction at 69#s. Pt did well with Rx today and sheets given for HEP.    Personal Factors and Comorbidities Comorbidity 1;Comorbidity 2    Examination-Activity Limitations Other    Examination-Participation Restrictions Other;Yard Work    Conservation officer, historic buildings Evolving/Moderate complexity    Rehab  Potential Good    PT Frequency 2x / week    PT Duration 6 weeks    PT Treatment/Interventions ADLs/Self Care Home Management;Cryotherapy;Electrical Stimulation;Ultrasound;Traction;Moist Heat;Functional mobility training;Therapeutic activities;Therapeutic exercise;Manual techniques;Patient/family education;Passive range of motion;Dry needling;Joint Manipulations;Spinal Manipulations    PT Next Visit Plan Int traction at 30% body weight, Combo e'stim/US and STW/M to bilateral L-S regions, back stabilization exercises.   ASSESS traction at 69#s           Patient will benefit from skilled therapeutic intervention in order to improve the following deficits and impairments:     Visit Diagnosis: Chronic left-sided low back pain without sciatica     Problem List Patient Active Problem List   Diagnosis Date Noted  . Morbid obesity (HCC) 03/24/2015    Gaelan Glennon,CHRIS, PTA 07/02/2020, 11:58 AM  [facility] [address] [place], [place], [address] Phone: [phone]   Fax:  [phone]  Name: AIRI COPADO MRN: [id number] Date of Birth: Jun 24, 1976

## 2020-07-07 ENCOUNTER — Ambulatory Visit: Payer: BC Managed Care – PPO | Admitting: Physical Therapy

## 2020-07-07 ENCOUNTER — Other Ambulatory Visit: Payer: Self-pay

## 2020-07-07 ENCOUNTER — Encounter: Payer: Self-pay | Admitting: Physical Therapy

## 2020-07-07 DIAGNOSIS — M545 Low back pain, unspecified: Secondary | ICD-10-CM

## 2020-07-07 NOTE — Therapy (Signed)
[facility] [address] [place], [place], [address] Phone: [phone]   Fax:  [phone]  Physical Therapy Treatment  Patient Details  Name: Carla Rogers MRN: [id number] Date of Birth: Nov 25, 1975 Referring Provider (PT): Eugenia Mcalpine MD   Encounter Date: 07/07/2020   PT End of Session - 07/07/20 0820    Visit Number 3    Number of Visits 12    Date for PT Re-Evaluation 07/27/20    Authorization Type FOTO.    PT Start Time 0820    PT Stop Time 0910    PT Time Calculation (min) 50 min    Activity Tolerance Patient tolerated treatment well    Behavior During Therapy [facility] for tasks assessed/performed           Past Medical History:  Diagnosis Date  . ADHD   . Anxiety   . Back pain   . Complication of anesthesia    slow to wake up   . Depression   . Diabetes mellitus without complication (HCC)   . GERD (gastroesophageal reflux disease)   . Hyperlipidemia   . Hypertension   . Joint pain   . Knee pain   . Multiple food allergies    (red onions- Headaches)  . Osteoarthritis   . Overactive bladder   . Palpitations   . PONV (postoperative nausea and vomiting)   . Sleep apnea    cpap   . Vitamin D deficiency     Past Surgical History:  Procedure Laterality Date  . CHOLECYSTECTOMY    . COLONOSCOPY    . exploratory vaginal surgery     . LAPAROSCOPIC GASTRIC SLEEVE RESECTION N/A 03/24/2015   Procedure: LAPAROSCOPIC GASTRIC SLEEVE RESECTION UPPER ENDOSCOPY;  Surgeon: Ovidio Kin, MD;  Location: [facility];  Service: General;  Laterality: N/A;  . LAPAROSCOPY    . pilonidal cyst removal     . UPPER GI ENDOSCOPY  03/24/2015   Procedure: UPPER GI ENDOSCOPY;  Surgeon: Ovidio Kin, MD;  Location: [facility];  Service: General;;    There were no vitals filed for this visit.   Subjective Assessment - 07/07/20 0817    Subjective COVID-19 screen performed prior to patient entering clinic. Reports discomfort in mid back. Had some  soreness after initial traction session but not excruciating.    Pertinent History Knee surgery, HTN, DM, OA.    How long can you sit comfortably? Varies.    How long can you stand comfortably? Varies.    How long can you walk comfortably? Varies.    Patient Stated Goals Reduce pain and be able to do more with less pain.    Currently in Pain? Yes    Pain Score 3     Pain Location Back    Pain Orientation Mid    Pain Descriptors / Indicators Discomfort    Pain Type Chronic pain    Pain Onset More than a month ago    Pain Frequency Intermittent              [facility] PT Assessment - 07/07/20 0001      Assessment   Medical Diagnosis Low back pain.    Referring Provider (PT) Eugenia Mcalpine MD      Precautions   Precautions None      Restrictions   Weight Bearing Restrictions No                         [facility] Adult PT  Treatment/Exercise - 07/07/20 0001      Lumbar Exercises: Standing   Scapular Retraction Strengthening;Both;20 reps    Scapular Retraction Limitations Blue XTS    Shoulder Extension Strengthening;Both;20 reps;Limitations    Shoulder Extension Limitations Blue XTS      Lumbar Exercises: Seated   Other Seated Lumbar Exercises Core press with ball x20 reps 5 sec holds    Other Seated Lumbar Exercises Horizontal abd x20 reps, B D2 red theraband x15 reps each      Lumbar Exercises: Supine   Bridge 20 reps;3 seconds      Modalities   Modalities Traction      Traction   Type of Traction Lumbar    Min (lbs) 15    Max (lbs) 75    Hold Time 99    Rest Time 5    Time 15                       PT Long Term Goals - 06/29/20 0865      PT LONG TERM GOAL #1   Title Ind with a HEP.    Time 4    Period Weeks    Status New      PT LONG TERM GOAL #2   Title Perform ADL's with pain not > 3/10.    Time 4    Period Weeks    Status New                 Plan - 07/07/20 0913    Clinical Impression Statement Patient presented in  clinic with greater thoracic pain reported than LBP. Patient progressed through more postural strengthening exercises due to thoracolumbar pain. No exaggerated pain reported during therex session. Mechanical lumbar traction continued at 75# max. No complaints following end of session.    Personal Factors and Comorbidities Comorbidity 1;Comorbidity 2    Comorbidities Knee surgery, HTN, DM, OA.    Examination-Activity Limitations Other    Examination-Participation Restrictions Other;Yard Work    Conservation officer, historic buildings Evolving/Moderate complexity    Rehab Potential Good    PT Frequency 2x / week    PT Duration 6 weeks    PT Treatment/Interventions ADLs/Self Care Home Management;Cryotherapy;Electrical Stimulation;Ultrasound;Traction;Moist Heat;Functional mobility training;Therapeutic activities;Therapeutic exercise;Manual techniques;Patient/family education;Passive range of motion;Dry needling;Joint Manipulations;Spinal Manipulations    PT Next Visit Plan Int traction at 30% body weight, Combo e'stim/US and STW/M to bilateral L-S regions, back stabilization exercises.   ASSESS traction at 69#s    Consulted and Agree with Plan of Care Patient           Patient will benefit from skilled therapeutic intervention in order to improve the following deficits and impairments:  Decreased activity tolerance  Visit Diagnosis: Chronic left-sided low back pain without sciatica     Problem List Patient Active Problem List   Diagnosis Date Noted  . Morbid obesity (HCC) 03/24/2015    Marvell Fuller, PTA 07/07/2020, 9:16 AM  [facility] [address] [place], [place], [address] Phone: [phone]   Fax:  [phone]  Name: Carla Rogers MRN: [id number] Date of Birth: 08-Aug-1975

## 2020-07-09 ENCOUNTER — Ambulatory Visit: Payer: BC Managed Care – PPO | Admitting: Physical Therapy

## 2020-07-09 ENCOUNTER — Other Ambulatory Visit: Payer: Self-pay

## 2020-07-09 DIAGNOSIS — G8929 Other chronic pain: Secondary | ICD-10-CM

## 2020-07-09 DIAGNOSIS — M545 Low back pain, unspecified: Secondary | ICD-10-CM

## 2020-07-09 NOTE — Patient Instructions (Signed)

## 2020-07-09 NOTE — Therapy (Signed)
[facility] [address] [place], [place], [address] Phone: [phone]   Fax:  [phone]  Physical Therapy Treatment  Patient Details  Name: Carla Rogers MRN: [id number] Date of Birth: [birth year] Referring Provider (PT): Eugenia Mcalpine MD   Encounter Date: 07/09/2020   PT End of Session - 07/09/20 0854    Visit Number 4    Number of Visits 12    Date for PT Re-Evaluation 07/27/20    Authorization Type FOTO.    PT Start Time 0815    PT Stop Time 0857    PT Time Calculation (min) 42 min    Activity Tolerance Patient tolerated treatment well    Behavior During Therapy WFL for tasks assessed/performed           Past Medical History:  Diagnosis Date  . ADHD   . Anxiety   . Back pain   . Complication of anesthesia    slow to wake up   . Depression   . Diabetes mellitus without complication (HCC)   . GERD (gastroesophageal reflux disease)   . Hyperlipidemia   . Hypertension   . Joint pain   . Knee pain   . Multiple food allergies    (red onions- Headaches)  . Osteoarthritis   . Overactive bladder   . Palpitations   . PONV (postoperative nausea and vomiting)   . Sleep apnea    cpap   . Vitamin D deficiency     Past Surgical History:  Procedure Laterality Date  . CHOLECYSTECTOMY    . COLONOSCOPY    . exploratory vaginal surgery     . LAPAROSCOPIC GASTRIC SLEEVE RESECTION N/A 03/24/2015   Procedure: LAPAROSCOPIC GASTRIC SLEEVE RESECTION UPPER ENDOSCOPY;  Surgeon: Ovidio Kin, MD;  Location: [facility];  Service: General;  Laterality: N/A;  . LAPAROSCOPY    . pilonidal cyst removal     . UPPER GI ENDOSCOPY  03/24/2015   Procedure: UPPER GI ENDOSCOPY;  Surgeon: Ovidio Kin, MD;  Location: [facility];  Service: General;;    There were no vitals filed for this visit.   Subjective Assessment - 07/09/20 0819    Subjective COVID-19 screen performed prior to patient entering clinic. Patient arrived with good response after  last treatment and 3/10 discomfort today.    Pertinent History Knee surgery, HTN, DM, OA.    How long can you sit comfortably? Varies.    How long can you stand comfortably? Varies.    How long can you walk comfortably? Varies.    Patient Stated Goals Reduce pain and be able to do more with less pain.    Currently in Pain? Yes    Pain Score 3     Pain Location Back    Pain Orientation Mid    Pain Descriptors / Indicators Discomfort    Pain Type Chronic pain    Pain Onset More than a month ago    Pain Frequency Intermittent    Aggravating Factors  Prolong siting and prolong activity    Pain Relieving Factors at rest                             [facility] Adult PT Treatment/Exercise - 07/09/20 0001      Lumbar Exercises: Aerobic   Nustep L3 x UE/LE, core focus      Lumbar Exercises: Standing   Scapular Retraction Strengthening;Both;20 reps    Scapular Retraction Limitations Blue XTS  Shoulder Extension Strengthening;Both;20 reps;Limitations    Shoulder Extension Limitations Blue XTS      Lumbar Exercises: Seated   Other Seated Lumbar Exercises seated posture strengthening "W", "T", "V" x20 each      Lumbar Exercises: Supine   Bridge 20 reps;3 seconds    Straight Leg Raise 3 seconds   2x10     Traction   Type of Traction Lumbar    Min (lbs) 15    Max (lbs) 80    Hold Time 99    Rest Time 5    Time 15                  PT Education - 07/09/20 0905    Education Details Posture awareness techniques    Person(s) Educated Patient    Methods Explanation;Demonstration;Handout    Comprehension Verbalized understanding;Returned demonstration               PT Long Term Goals - 07/09/20 0825      PT LONG TERM GOAL #1   Title Ind with a HEP.    Time 4    Period Weeks    Status On-going      PT LONG TERM GOAL #2   Title Perform ADL's with pain not > 3/10.    Baseline Pain increase spasms ongoing 07/09/20    Time 4    Period Weeks     Status On-going                 Plan - 07/09/20 0857    Clinical Impression Statement Patient tolerated treatment well today and responded well to increased traction. Patient educated on posture awareness techniques with HEP provided. Patient has a good understanding of core and mid back strengthening. Patient current goals ongoing due to ongoing pain and spasms in low back.    Personal Factors and Comorbidities Comorbidity 1;Comorbidity 2    Comorbidities Knee surgery, HTN, DM, OA.    Examination-Activity Limitations Other    Examination-Participation Restrictions Other;Yard Work    Conservation officer, historic buildings Evolving/Moderate complexity    Rehab Potential Good    PT Frequency 2x / week    PT Duration 6 weeks    PT Treatment/Interventions ADLs/Self Care Home Management;Cryotherapy;Electrical Stimulation;Ultrasound;Traction;Moist Heat;Functional mobility training;Therapeutic activities;Therapeutic exercise;Manual techniques;Patient/family education;Passive range of motion;Dry needling;Joint Manipulations;Spinal Manipulations    PT Next Visit Plan cont with POC for traction core and posture strengthening and Combo e'stim/US and STW/M to bilateral L-S regions PRN    Consulted and Agree with Plan of Care Patient           Patient will benefit from skilled therapeutic intervention in order to improve the following deficits and impairments:  Decreased activity tolerance  Visit Diagnosis: Chronic left-sided low back pain without sciatica     Problem List Patient Active Problem List   Diagnosis Date Noted  . Morbid obesity (HCC) 03/24/2015    Jodell Weitman P 07/09/2020, 9:22 AM  [facility] [address] [place], [place], [address] Phone: [phone]   Fax:  [phone]  Name: Carla Rogers MRN: [id number] Date of Birth: 1976/02/09

## 2020-07-09 NOTE — Therapy (Signed)
[facility] [address] [place], [place], [address] Phone: [phone]   Fax:  [phone]  Physical Therapy Treatment  Patient Details  Name: Carla Rogers MRN: [id number] Date of Birth: 11-01-1975 Referring Provider (PT): Eugenia Mcalpine MD   Encounter Date: 07/09/2020   PT End of Session - 07/09/20 0854    Visit Number 4    Number of Visits 12    Date for PT Re-Evaluation 07/27/20    Authorization Type FOTO.    PT Start Time 0815    PT Stop Time 0857    PT Time Calculation (min) 42 min    Activity Tolerance Patient tolerated treatment well    Behavior During Therapy WFL for tasks assessed/performed           Past Medical History:  Diagnosis Date  . ADHD   . Anxiety   . Back pain   . Complication of anesthesia    slow to wake up   . Depression   . Diabetes mellitus without complication (HCC)   . GERD (gastroesophageal reflux disease)   . Hyperlipidemia   . Hypertension   . Joint pain   . Knee pain   . Multiple food allergies    (red onions- Headaches)  . Osteoarthritis   . Overactive bladder   . Palpitations   . PONV (postoperative nausea and vomiting)   . Sleep apnea    cpap   . Vitamin D deficiency     Past Surgical History:  Procedure Laterality Date  . CHOLECYSTECTOMY    . COLONOSCOPY    . exploratory vaginal surgery     . LAPAROSCOPIC GASTRIC SLEEVE RESECTION N/A 03/24/2015   Procedure: LAPAROSCOPIC GASTRIC SLEEVE RESECTION UPPER ENDOSCOPY;  Surgeon: Ovidio Kin, MD;  Location: [facility];  Service: General;  Laterality: N/A;  . LAPAROSCOPY    . pilonidal cyst removal     . UPPER GI ENDOSCOPY  03/24/2015   Procedure: UPPER GI ENDOSCOPY;  Surgeon: Ovidio Kin, MD;  Location: [facility];  Service: General;;    There were no vitals filed for this visit.   Subjective Assessment - 07/09/20 0819    Subjective COVID-19 screen performed prior to patient entering clinic. Patient arrived with good response after  last treatment and 3/10 discomfort today.    Pertinent History Knee surgery, HTN, DM, OA.    How long can you sit comfortably? Varies.    How long can you stand comfortably? Varies.    How long can you walk comfortably? Varies.    Patient Stated Goals Reduce pain and be able to do more with less pain.    Currently in Pain? Yes    Pain Score 3     Pain Location Back    Pain Orientation Mid    Pain Descriptors / Indicators Discomfort    Pain Type Chronic pain    Pain Onset More than a month ago    Pain Frequency Intermittent    Aggravating Factors  Prolong siting and prolong activity    Pain Relieving Factors at rest                             [facility] Adult PT Treatment/Exercise - 07/09/20 0001      Lumbar Exercises: Aerobic   Nustep L3 x UE/LE, core focus      Lumbar Exercises: Standing   Scapular Retraction Strengthening;Both;20 reps    Scapular Retraction Limitations Blue XTS  Shoulder Extension Strengthening;Both;20 reps;Limitations    Shoulder Extension Limitations Blue XTS      Lumbar Exercises: Seated   Other Seated Lumbar Exercises seated posture strengthening "W", "T", "V" x20 each      Lumbar Exercises: Supine   Bridge 20 reps;3 seconds    Straight Leg Raise 3 seconds   2x10     Traction   Type of Traction Lumbar    Min (lbs) 15    Max (lbs) 80    Hold Time 99    Rest Time 5    Time 15                  PT Education - 07/09/20 0905    Education Details Posture awareness techniques    Person(s) Educated Patient    Methods Explanation;Demonstration;Handout    Comprehension Verbalized understanding;Returned demonstration               PT Long Term Goals - 07/09/20 0825      PT LONG TERM GOAL #1   Title Ind with a HEP.    Time 4    Period Weeks    Status On-going      PT LONG TERM GOAL #2   Title Perform ADL's with pain not > 3/10.    Baseline Pain increase spasms ongoing 07/09/20    Time 4    Period Weeks     Status On-going                 Plan - 07/09/20 0857    Clinical Impression Statement Patient tolerated treatment well today and responded well to increased traction. Patient educated on posture awareness techniques with HEP provided. Patient has a good understanding of core and mid back strengthening. Patient current goals ongoing due to ongoing pain and spasms in low back.    Personal Factors and Comorbidities Comorbidity 1;Comorbidity 2    Comorbidities Knee surgery, HTN, DM, OA.    Examination-Activity Limitations Other    Examination-Participation Restrictions Other;Yard Work    Conservation officer, historic buildings Evolving/Moderate complexity    Rehab Potential Good    PT Frequency 2x / week    PT Duration 6 weeks    PT Treatment/Interventions ADLs/Self Care Home Management;Cryotherapy;Electrical Stimulation;Ultrasound;Traction;Moist Heat;Functional mobility training;Therapeutic activities;Therapeutic exercise;Manual techniques;Patient/family education;Passive range of motion;Dry needling;Joint Manipulations;Spinal Manipulations    PT Next Visit Plan cont with POC for traction core and posture strengthening and Combo e'stim/US and STW/M to bilateral L-S regions PRN    Consulted and Agree with Plan of Care Patient           Patient will benefit from skilled therapeutic intervention in order to improve the following deficits and impairments:  Decreased activity tolerance  Visit Diagnosis: Chronic left-sided low back pain without sciatica     Problem List Patient Active Problem List   Diagnosis Date Noted  . Morbid obesity (HCC) 03/24/2015    Haizlee Henton P, PTA 07/09/2020, 9:06 AM  [facility] [address] [place], [place], [address] Phone: [phone]   Fax:  [phone]  Name: Carla Rogers MRN: [id number] Date of Birth: 08-Jul-1976

## 2020-07-13 ENCOUNTER — Other Ambulatory Visit: Payer: Self-pay

## 2020-07-13 ENCOUNTER — Ambulatory Visit: Payer: BC Managed Care – PPO | Admitting: Physical Therapy

## 2020-07-13 DIAGNOSIS — M545 Low back pain, unspecified: Secondary | ICD-10-CM | POA: Diagnosis not present

## 2020-07-13 NOTE — Therapy (Signed)
[facility] [address] [place], [place], [address] Phone: [phone]   Fax:  [phone]  Physical Therapy Treatment  Patient Details  Name: Carla Rogers MRN: [id number] Date of Birth: Apr 18, 1976 Referring Provider (PT): Eugenia Mcalpine MD   Encounter Date: 07/13/2020   PT End of Session - 07/13/20 1026    Visit Number 5    Number of Visits 12    Date for PT Re-Evaluation 07/27/20    Authorization Type FOTO.    PT Start Time [phone]    PT Stop Time 1034    PT Time Calculation (min) 46 min    Activity Tolerance Patient tolerated treatment well    Behavior During Therapy WFL for tasks assessed/performed           Past Medical History:  Diagnosis Date  . ADHD   . Anxiety   . Back pain   . Complication of anesthesia    slow to wake up   . Depression   . Diabetes mellitus without complication (HCC)   . GERD (gastroesophageal reflux disease)   . Hyperlipidemia   . Hypertension   . Joint pain   . Knee pain   . Multiple food allergies    (red onions- Headaches)  . Osteoarthritis   . Overactive bladder   . Palpitations   . PONV (postoperative nausea and vomiting)   . Sleep apnea    cpap   . Vitamin D deficiency     Past Surgical History:  Procedure Laterality Date  . CHOLECYSTECTOMY    . COLONOSCOPY    . exploratory vaginal surgery     . LAPAROSCOPIC GASTRIC SLEEVE RESECTION N/A 03/24/2015   Procedure: LAPAROSCOPIC GASTRIC SLEEVE RESECTION UPPER ENDOSCOPY;  Surgeon: Ovidio Kin, MD;  Location: [facility];  Service: General;  Laterality: N/A;  . LAPAROSCOPY    . pilonidal cyst removal     . UPPER GI ENDOSCOPY  03/24/2015   Procedure: UPPER GI ENDOSCOPY;  Surgeon: Ovidio Kin, MD;  Location: [facility];  Service: General;;    There were no vitals filed for this visit.   Subjective Assessment - 07/13/20 0954    Subjective COVID-19 screen performed prior to patient entering clinic. Patient arrived with no pain and did well  after last treatment.    Pertinent History Knee surgery, HTN, DM, OA.    How long can you sit comfortably? Varies.    How long can you stand comfortably? Varies.    How long can you walk comfortably? Varies.    Patient Stated Goals Reduce pain and be able to do more with less pain.    Currently in Pain? No/denies                             [facility] Adult PT Treatment/Exercise - 07/13/20 0001      Lumbar Exercises: Aerobic   Nustep L4 x11min UE/LE      Lumbar Exercises: Standing   Scapular Retraction Strengthening;Both;20 reps    Scapular Retraction Limitations Blue XTS    Shoulder Extension Strengthening;Both;20 reps;Limitations    Shoulder Extension Limitations Blue XTS      Lumbar Exercises: Supine   Bent Knee Raise 3 seconds   2x10   Bridge 20 reps;3 seconds    Straight Leg Raise 3 seconds   2x10   Other Supine Lumbar Exercises clamshell x30 with red t-band      Traction   Type of Traction Lumbar  Min (lbs) 15    Max (lbs) 90    Hold Time 99    Rest Time 5    Time 15                       PT Long Term Goals - 07/09/20 0825      PT LONG TERM GOAL #1   Title Ind with a HEP.    Time 4    Period Weeks    Status On-going      PT LONG TERM GOAL #2   Title Perform ADL's with pain not > 3/10.    Baseline Pain increase spasms ongoing 07/09/20    Time 4    Period Weeks    Status On-going                 Plan - 07/13/20 1027    Clinical Impression Statement Patient tolerated treatment well today. Patient continues to respond to lumbar mechanical traction and able to increase today with good respone. Patient progressing with core exericses and able to perform ADL's with less discomfort. Goals progressing today.    Personal Factors and Comorbidities Comorbidity 1;Comorbidity 2    Comorbidities Knee surgery, HTN, DM, OA.    Examination-Activity Limitations Other    Examination-Participation Restrictions Other;Yard Work     Conservation officer, historic buildings Evolving/Moderate complexity    Rehab Potential Good    PT Frequency 2x / week    PT Duration 6 weeks    PT Treatment/Interventions ADLs/Self Care Home Management;Cryotherapy;Electrical Stimulation;Ultrasound;Traction;Moist Heat;Functional mobility training;Therapeutic activities;Therapeutic exercise;Manual techniques;Patient/family education;Passive range of motion;Dry needling;Joint Manipulations;Spinal Manipulations    PT Next Visit Plan cont with POC for traction core and posture strengthening and Combo e'stim/US and STW/M to bilateral L-S regions PRN    Consulted and Agree with Plan of Care Patient           Patient will benefit from skilled therapeutic intervention in order to improve the following deficits and impairments:  Decreased activity tolerance  Visit Diagnosis: Chronic left-sided low back pain without sciatica     Problem List Patient Active Problem List   Diagnosis Date Noted  . Morbid obesity (HCC) 03/24/2015    Carla Rogers, PTA 07/13/2020, 10:46 AM  [facility] [address] [place], [place], [address] Phone: [phone]   Fax:  [phone]  Name: Carla Rogers MRN: [id number] Date of Birth: 05/05/1976

## 2020-07-17 ENCOUNTER — Encounter: Payer: BC Managed Care – PPO | Admitting: Physical Therapy

## 2021-03-30 NOTE — Progress Notes (Signed)
03/31/21- 45 yoF former smoker(22.5 pkyrs) for sleep evaluation courtesy of Roe Rutherford, NP with concern of OSA Medical problem list includes HTN, OSA, GERD, DM, Osteoarthritis, Morbid Obesity, Hyperlipidemia, Depression, ADHD. 3 special needs kids. NPSG  06/10/11- AHI 81/ hr, desaturation to 80%, BMI 49, CPAP to 9 Epworth score-10 Body weight today-227 lbs Covid vax-3 Moderna -----Sleep Consult-snoring. She had lost weight and stopped using machine years ago.  Now family tells her she snores. Wakes frequently, stays tired. Occasional ativan if needed for sleep. 1-2 cups coffee. No ENT surgery, heart or lung disease. +Family hx OSA.  Prior to Admission medications   Medication Sig Start Date End Date Taking? Authorizing Provider  amphetamine-dextroamphetamine (ADDERALL) 20 MG tablet Take 20 mg by mouth 2 (two) times daily.   Yes [provider]  ascorbic acid (VITAMIN C) 500 MG tablet Take 500 mg by mouth daily.   Yes [provider]  buPROPion (WELLBUTRIN XL) 300 MG 24 hr tablet Take 300 mg by mouth daily.   Yes [provider]  Cholecalciferol (VITAMIN D) 125 MCG (5000 UT) CAPS Take 1 capsule by mouth daily.   Yes [provider]  escitalopram (LEXAPRO) 20 MG tablet Take 20 mg by mouth every evening.   Yes [provider]  levonorgestrel (LILETTA, 52 MG,) 19.5 MCG/DAY IUD IUD 1 each by Intrauterine route once.   Yes [provider]  LORazepam (ATIVAN) 1 MG tablet Take 1 mg by mouth daily as needed for anxiety or sleep.    Yes [provider]  Magnesium 400 MG CAPS Take 1 capsule by mouth daily.   Yes [provider]  metFORMIN (GLUCOPHAGE) 500 MG tablet Take 500 mg by mouth 2 (two) times daily with a meal.    Yes [provider]  Multiple Vitamin (THERA) TABS Take 1 tablet by mouth every morning. Chewable   Yes [provider]  Omega 3 1200 MG CAPS Take 1 capsule by mouth daily.   Yes [provider]  rosuvastatin (CRESTOR) 20 MG tablet Take 20 mg by mouth every evening.   Yes [provider]  TURMERIC PO Take 1,000 mg by mouth daily.   Yes [provider]  Vitamin D, Ergocalciferol, (DRISDOL) 1.25 MG (50000 UNIT) CAPS capsule Take 1 capsule (50,000 Units total) by mouth every 7 (seven) days. 12/11/19  Yes Quillian Quince D, MD   Past Medical History:  Diagnosis Date   ADHD    Anxiety    Back pain    Complication of anesthesia    slow to wake up    Depression    Diabetes mellitus without complication (HCC)    GERD (gastroesophageal reflux disease)    Hyperlipidemia    Hypertension    Joint pain    Knee pain    Multiple food allergies    (red onions- Headaches)   Osteoarthritis    Overactive bladder    Palpitations    PONV (postoperative nausea and vomiting)    Sleep apnea    cpap    Vitamin D deficiency    Past Surgical History:  Procedure Laterality Date   CHOLECYSTECTOMY     COLONOSCOPY     exploratory vaginal surgery      LAPAROSCOPIC GASTRIC SLEEVE RESECTION N/A 03/24/2015   Procedure: LAPAROSCOPIC GASTRIC SLEEVE RESECTION UPPER ENDOSCOPY;  Surgeon: Ovidio Kin, MD;  Location: [facility];  Service: General;  Laterality: N/A;   LAPAROSCOPY     pilonidal cyst removal  UPPER GI ENDOSCOPY  03/24/2015   Procedure: UPPER GI ENDOSCOPY;  Surgeon: Ovidio Kin, MD;  Location: [facility];  Service: General;;   Family History  Problem Relation Age of Onset   Hypertension Mother    Hyperlipidemia Mother    Sudden death Mother    Depression Mother    Diabetes Father    Hypertension Father    Hyperlipidemia Father    Heart disease Father    Stroke Father    Anxiety disorder Father    Sleep apnea Father    Alcoholism Father    Obesity Father    Social History   Socioeconomic History   Marital status: Married    Spouse name: Thayer Ohm   Number of children: 3   Years of education: Not on file   Highest education level: Not on file   Occupational History   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  Tobacco Use   Smoking status: Former    Packs/day: 1.50    Years: 15.00    Pack years: 22.50    Types: Cigarettes, E-cigarettes    Quit date: 08/01/2008    Years since quitting: 12.6   Smokeless tobacco: Never  Substance and Sexual Activity   Alcohol use: Yes    Comment: occasional    Drug use: No   Sexual activity: Not on file  Other Topics Concern   Not on file  Social History Narrative   Not on file   Social Determinants of Health   Financial Resource Strain: Not on file  Food Insecurity: Not on file  Transportation Needs: Not on file  Physical Activity: Not on file  Stress: Not on file  Social Connections: Not on file  Intimate Partner Violence: Not on file   ROS-see HPI   + = positive Constitutional:    weight loss, night sweats, fevers, chills, fatigue, lassitude. HEENT:    headaches, difficulty swallowing, tooth/dental problems, sore throat,       sneezing, itching, ear ache, nasal congestion, post nasal drip, snoring CV:    chest pain, orthopnea, PND, swelling in lower extremities, anasarca,                                   dizziness, palpitations Resp:   shortness of breath with exertion or at rest.                productive cough,   non-productive cough, coughing up of blood.              change in color of mucus.  wheezing.   Skin:    rash or lesions. GI:  No-   heartburn, indigestion, abdominal pain, nausea, vomiting, diarrhea,                 change in bowel habits, loss of appetite GU: dysuria, change in color of urine, no urgency or frequency.   flank pain. MS:   +joint pain, stiffness, decreased range of motion, back pain. Neuro-     nothing unusual Psych:  change in mood or affect.  +depression or +anxiety.   memory loss.   OBJ- Physical Exam General- Alert, Oriented, Affect-appropriate, Distress- none acute, + morbid obesity Skin- rash-none, lesions- none, excoriation-  none Lymphadenopathy- none Head- atraumatic            Eyes- Gross vision intact, PERRLA, conjunctivae and secretions clear  Ears- Hearing, canals-normal            Nose- Clear, no-Septal dev, mucus, polyps, erosion, perforation             Throat- Mallampati IV , mucosa clear , drainage- none, tonsils- atrophic, + teeth Neck- flexible , trachea midline, no stridor , thyroid nl, carotid no bruit Chest - symmetrical excursion , unlabored           Heart/CV- RRR , no murmur , no gallop  , no rub, nl s1 s2                           - JVD- none , edema- none, stasis changes- none, varices- none           Lung- clear to P&A, wheeze- none, cough- none , dullness-none, rub- none           Chest wall-  Abd-  Br/ Gen/ Rectal- Not done, not indicated Extrem- cyanosis- none, clubbing, none, atrophy- none, strength- nl Neuro- grossly intact to observation

## 2021-03-31 ENCOUNTER — Ambulatory Visit (INDEPENDENT_AMBULATORY_CARE_PROVIDER_SITE_OTHER): Payer: BC Managed Care – PPO | Admitting: Internal Medicine

## 2021-03-31 ENCOUNTER — Encounter: Payer: Self-pay | Admitting: Internal Medicine

## 2021-03-31 ENCOUNTER — Other Ambulatory Visit: Payer: Self-pay

## 2021-03-31 VITALS — BP 142/90 | HR 85 | Temp 98.0°F | Ht 61.5 in | Wt 227.2 lb

## 2021-03-31 DIAGNOSIS — G4733 Obstructive sleep apnea (adult) (pediatric): Secondary | ICD-10-CM | POA: Diagnosis not present

## 2021-03-31 NOTE — Assessment & Plan Note (Signed)
She implies that stress related to having 3 special needs kids drives some of her eating. Plan - encourage weight loss. Consider referral to [facility]

## 2021-03-31 NOTE — Assessment & Plan Note (Signed)
She weighs a little less than at time of original sleep study, but still high probability she needs treatment. Discussed options,  But with her body habitus, CPAP is likely first choicee. Plan- HST

## 2021-03-31 NOTE — Patient Instructions (Signed)
Order- schedule home sleep test   dx OSA  Please call us for results and recommendations about 2 weeks after your sleep test 

## 2021-06-02 ENCOUNTER — Telehealth: Payer: Self-pay | Admitting: Internal Medicine

## 2021-06-04 NOTE — Telephone Encounter (Signed)
Left vm for pt to call me to set up hst. °

## 2021-06-04 NOTE — Telephone Encounter (Signed)
Spoke to pt & scheduled her hst.  Nothing further needed. 

## 2021-06-11 ENCOUNTER — Ambulatory Visit: Payer: BC Managed Care – PPO

## 2021-06-11 ENCOUNTER — Other Ambulatory Visit: Payer: Self-pay

## 2021-06-11 DIAGNOSIS — G4733 Obstructive sleep apnea (adult) (pediatric): Secondary | ICD-10-CM

## 2021-06-29 DIAGNOSIS — G4733 Obstructive sleep apnea (adult) (pediatric): Secondary | ICD-10-CM

## 2021-06-30 ENCOUNTER — Ambulatory Visit: Payer: BC Managed Care – PPO | Admitting: Internal Medicine

## 2021-07-01 ENCOUNTER — Telehealth: Payer: Self-pay | Admitting: Internal Medicine

## 2021-07-01 DIAGNOSIS — G4733 Obstructive sleep apnea (adult) (pediatric): Secondary | ICD-10-CM

## 2021-07-01 NOTE — Telephone Encounter (Signed)
Home sleep test showed moderate to severe sleep apnea, averaging 29-30 apneas/ hour with drops in blood oxygen level.  I recommend we order new DME, new CPAP auto 5-15, mask of choice, humidifier, supplies, airView/ card  She will need to see me again in 31-90 days after she gets her machine- per insurance regs.

## 2021-07-01 NOTE — Telephone Encounter (Signed)
Pt had sleep study performed 11/11 and is calling to see if the results are avail. Dr. Maple Hudson, please advise.

## 2021-07-02 NOTE — Telephone Encounter (Signed)
Call made to patient, confirmed DOB. Made aware of CY recommendations. Voiced understanding. Made aware there is a national delay on Cpap machines so there will be a wait. Voiced understanding. Patient states she has a resmed machine from years ago that has not been used in year and she wanted to know whether she should use it until she receives the new machine. I made her aware she she has not used it in years she probably does not need to use the machine due to dust, mold, etc however I would ask provider and let her know.   CY please advise if patient should use her cpap machine from 7 years ago.

## 2021-07-02 NOTE — Telephone Encounter (Signed)
LMTCB

## 2021-07-02 NOTE — Telephone Encounter (Signed)
She can take her old CPAP to her DME and ask them to clean and service it, and set it for her current settings. Then shee can use this until her new machine is available.

## 2021-07-13 NOTE — Telephone Encounter (Signed)
Called and spoke to pt. Informed her of the information per Dr. Maple Hudson. Order placed for new CPAP along with the request to use the old CPAP. Will keep encounter open to see if old machine can be used, if so an appt needs to be made with CY for 31-90 days from start.

## 2021-07-20 NOTE — Telephone Encounter (Signed)
Pt does have a f/u scheduled with CY 12/29.

## 2021-07-29 ENCOUNTER — Ambulatory Visit: Payer: BC Managed Care – PPO | Admitting: Internal Medicine

## 2021-08-09 ENCOUNTER — Ambulatory Visit (INDEPENDENT_AMBULATORY_CARE_PROVIDER_SITE_OTHER): Payer: BC Managed Care – PPO | Admitting: Plastic Surgery

## 2021-08-09 ENCOUNTER — Other Ambulatory Visit: Payer: Self-pay

## 2021-08-09 VITALS — BP 137/85 | HR 70 | Ht 61.5 in | Wt 201.4 lb

## 2021-08-09 DIAGNOSIS — N62 Hypertrophy of breast: Secondary | ICD-10-CM

## 2021-08-10 NOTE — Progress Notes (Signed)
Referring Provider Rick Duff, PA-C [address] [place],  [place] [address]   CC:  Breast hypertrophy and back pain.   Carla Rogers is an 46 y.o. female.  HPI: The patient is a 46 year old female who has macromastia.  She has back neck and shoulder pain.  She also has shoulder grooving and rashes.  She uses antifungal spray under her breast.  Her last mammogram was in [facility] with [facility] and she will provide [place] with the results.  She does not have a family history of breast cancer.  She had a left breast biopsy over 10 years ago which was benign.  She is currently a tobacco smoker and vapes.  She is a diabetic but her hemoglobin A1c's have been consistently less than 7 based on patient report.  She also has a history of a T12 compression fracture.  She feels that her breasts exacerbate this pain  Allergies  Allergen Reactions   Pneumococcal Vaccines Shortness Of Breath   Codeine    Macrodantin [Nitrofurantoin Macrocrystal] Rash and Other (See Comments)    dizziness   Mobic [Meloxicam] Rash   Sulfa Antibiotics Rash    Outpatient Encounter Medications as of 08/09/2021  Medication Sig   amphetamine-dextroamphetamine (ADDERALL) 20 MG tablet Take by mouth.   ascorbic acid (VITAMIN C) 500 MG tablet Take 500 mg by mouth daily.   buPROPion (WELLBUTRIN XL) 300 MG 24 hr tablet Take 300 mg by mouth daily.   Cholecalciferol (VITAMIN D) 125 MCG (5000 UT) CAPS Take 1 capsule by mouth daily.   escitalopram (LEXAPRO) 20 MG tablet Take 20 mg by mouth every evening.   levonorgestrel (LILETTA, 52 MG,) 19.5 MCG/DAY IUD IUD 1 each by Intrauterine route once.   LORazepam (ATIVAN) 1 MG tablet Take 1 mg by mouth daily as needed for anxiety or sleep.    losartan-hydrochlorothiazide (HYZAAR) 100-25 MG tablet Take 1 tablet by mouth daily.   Magnesium 400 MG CAPS Take 1 capsule by mouth daily.   metFORMIN (GLUCOPHAGE) 500 MG tablet Take 500 mg by mouth 2 (two) times daily with a meal.     MOUNJARO 2.5 MG/0.5ML Pen SMARTSIG:2.5 Milligram(s) SUB-Q Once a Week   Multiple Vitamin (THERA) TABS Take 1 tablet by mouth every morning. Chewable   rosuvastatin (CRESTOR) 20 MG tablet Take 20 mg by mouth every evening.   solifenacin (VESICARE) 10 MG tablet Take by mouth daily.   [DISCONTINUED] TURMERIC PO Take 1,000 mg by mouth daily.   [DISCONTINUED] Vitamin D, Ergocalciferol, (DRISDOL) 1.25 MG (50000 UNIT) CAPS capsule Take 1 capsule (50,000 Units total) by mouth every 7 (seven) days.   No facility-administered encounter medications on file as of 08/09/2021.     Past Medical History:  Diagnosis Date   ADHD    Anxiety    Back pain    Complication of anesthesia    slow to wake up    Depression    Diabetes mellitus without complication (HCC)    GERD (gastroesophageal reflux disease)    Hyperlipidemia    Hypertension    Joint pain    Knee pain    Multiple food allergies    (red onions- Headaches)   Osteoarthritis    Overactive bladder    Palpitations    PONV (postoperative nausea and vomiting)    Sleep apnea    cpap    Vitamin D deficiency     Past Surgical History:  Procedure Laterality Date   CHOLECYSTECTOMY  COLONOSCOPY     exploratory vaginal surgery      LAPAROSCOPIC GASTRIC SLEEVE RESECTION N/A 03/24/2015   Procedure: LAPAROSCOPIC GASTRIC SLEEVE RESECTION UPPER ENDOSCOPY;  Surgeon: Ovidio Kin, MD;  Location: [facility];  Service: General;  Laterality: N/A;   LAPAROSCOPY     pilonidal cyst removal      UPPER GI ENDOSCOPY  03/24/2015   Procedure: UPPER GI ENDOSCOPY;  Surgeon: Ovidio Kin, MD;  Location: [facility];  Service: General;;    Family History  Problem Relation Age of Onset   Hypertension Mother    Hyperlipidemia Mother    Sudden death Mother    Depression Mother    Diabetes Father    Hypertension Father    Hyperlipidemia Father    Heart disease Father    Stroke Father    Anxiety disorder Father    Sleep apnea Father    Alcoholism Father     Obesity Father     Social History   Social History Narrative   Not on file     Review of Systems General: Denies fevers, chills, weight loss CV: Denies chest pain, shortness of breath, palpitations   Physical Exam Vitals with BMI 08/09/2021 03/31/2021 12/11/2019  Height 5' 1.5" 5' 1.5" 5\' 2"   Weight 201 lbs 6 oz 227 lbs 3 oz 229 lbs  BMI 37.44 42.24 41.87  Systolic 137 142  Diastolic 85 90 84  Pulse 70 85 75    General:  No acute distress,  Alert and oriented, Non-Toxic, Normal speech and affect Breast: Sternal notch to nipple 38 cm bilaterally, nipple to fold 14 cm on the right and 15 cm on the left, base width 19 cm on the right and 19 cm on the left  Assessment/Plan Patient is a 46 year old who is a good candidate for breast reduction other than that I would like her to stop using all tobacco products.  I think she is a candidate for a greater than 750 g breast reduction after she stops all nicotine.  I instructed her to call [phone] 4 weeks after she has stopped all nicotine and we can send a urine cotinine test.  Time based coding: 31 minutes were spent with the patient.  Greater than 50% was spent on counseling cordination of care.  We discussed we discussed various types of breast reduction.  We also discussed smoking cessation.  [place] 08/10/2021, 8:15 PM

## 2021-09-13 NOTE — Addendum Note (Signed)
Addended by: [name] on: 09/13/2021 12:59 PM   Modules accepted: Orders

## 2021-09-30 LAB — NICOTINE SCREEN, URINE: Cotinine Ql Scrn, Ur: NEGATIVE ng/mL

## 2021-10-01 ENCOUNTER — Telehealth: Payer: Self-pay | Admitting: Plastic Surgery

## 2021-10-01 NOTE — Telephone Encounter (Signed)
Patient left voice message r- patient of Dr Domenica Reamer - regarding her nicotine test* ? *per verbal by Dr Domenica Reamer - send message to Judeth Cornfield to get posted. ?

## 2021-10-06 ENCOUNTER — Telehealth: Payer: Self-pay

## 2021-10-06 NOTE — Telephone Encounter (Signed)
Patient called to say she has passed her nicotine test.  She said Dr. Erin Hearing said she would need to be nicotine-free 16 weeks prior to her surgery.  She said she stopped smoking the day after her appt with Dr. Erin Hearing, which would make her first day without nicotine January 10.  She just wants to verify that Dr. Erin Hearing will see that as her 1st day nicotine-free.  Please call. ?

## 2021-10-11 ENCOUNTER — Other Ambulatory Visit: Payer: Self-pay

## 2021-10-11 ENCOUNTER — Encounter: Payer: Self-pay | Admitting: Plastic Surgery

## 2021-10-11 ENCOUNTER — Ambulatory Visit (INDEPENDENT_AMBULATORY_CARE_PROVIDER_SITE_OTHER): Payer: BC Managed Care – PPO | Admitting: Plastic Surgery

## 2021-10-11 DIAGNOSIS — N62 Hypertrophy of breast: Secondary | ICD-10-CM | POA: Diagnosis not present

## 2021-10-11 DIAGNOSIS — G8929 Other chronic pain: Secondary | ICD-10-CM

## 2021-10-11 DIAGNOSIS — M546 Pain in thoracic spine: Secondary | ICD-10-CM | POA: Diagnosis not present

## 2021-10-11 NOTE — Progress Notes (Signed)
? ?  Referring Provider ?Rick Duff, PA-C ?[phone] ?[place],  [place] [address]  ? ?CC: No chief complaint on file. ?   ? ?Carla Rogers is an 46 y.o. female.  ?HPI: 46 year old interested in bilateral breast reduction.  She has been off of nicotine since early January and has now has a negative urine cotinine test.  She has previously done physical therapy for back pain toward the end of 2021.  She is interested in proceeding with breast reduction ? ?Review of Systems ?General: No fever chills or change in health status. ? ?Physical Exam ?Vitals with BMI 08/09/2021 03/31/2021 12/11/2019  ?Height 5' 1.5" 5' 1.5" 5\' 2"   ?Weight 201 lbs 6 oz 227 lbs 3 oz 229 lbs  ?BMI 37.44 42.24 41.87  ?Systolic 137 142  ?Diastolic 85 90 84  ?Pulse 70 85 75  ?  ? ?Breast: Phone visit ? ?Assessment/Plan ?Patient has been interested in breast reduction.  She has had a negative urine cotinine test and is now interested in proceeding. ? ?Time based coding phone visit: ?Time on call:  6 minutes ?Call type: voice ?Patient location: home ?Physician location:  office ?  ? ?115 ?10/11/2021, 3:29 PM  ? ? ?    ?

## 2021-11-24 ENCOUNTER — Telehealth: Payer: Self-pay | Admitting: Plastic Surgery

## 2021-11-24 NOTE — Telephone Encounter (Signed)
Patient called to accept/confirm date of 12/29/21.  ?Requesting call back with information about what driving restrictions will be.  ?Please advise at [phone]. ?

## 2021-11-26 ENCOUNTER — Ambulatory Visit (INDEPENDENT_AMBULATORY_CARE_PROVIDER_SITE_OTHER): Payer: BC Managed Care – PPO | Admitting: Plastic Surgery

## 2021-11-26 DIAGNOSIS — N62 Hypertrophy of breast: Secondary | ICD-10-CM

## 2021-11-26 NOTE — Progress Notes (Signed)
? ?  Referring Provider ?Rick Duff, PA-C ?[phone] ?[place],  [place] [address]  ? ?CC: Breast hypertrophy ? ?Carla Rogers is an 46 y.o. female.  ?HPI: Patient is a 46 year old with significant breast hypertrophy.  She is interested in proceeding with surgery.  Her urine cotinine was negative.  We discussed risk and benefits and she wants to proceed. ? ?Review of Systems ?General: No fever no chills ? ?Physical Exam ? ?  08/09/2021  ? 11:29 AM 03/31/2021  ? 10:12 AM 12/11/2019  ? 12:00 PM  ?Vitals with BMI  ?Height 5' 1.5" 5' 1.5" 5\' 2"   ?Weight 201 lbs 6 oz 227 lbs 3 oz 229 lbs  ?BMI 37.44 42.24 41.87  ?Systolic 137 142  ?Diastolic 85 90 84  ?Pulse 70 85 75  ?  ?Phone visit, exam not repeated. ? ?Assessment/Plan ?Patient has a surgery date and we reviewed her labs.  We discussed risk and benefits again including nipple necrosis, wound problems, asymmetry and the patient wishes to proceed. ? ?Time based coding phone visit: ?Time on call:  7 min ?Call type: [phone] ?Patient location: home ?Physician location:  office ?  ? ?818 ?11/26/2021, 2:33 PM  ? ? ?    ?

## 2021-12-13 ENCOUNTER — Encounter: Payer: Self-pay | Admitting: Physician Assistant

## 2021-12-13 ENCOUNTER — Ambulatory Visit (INDEPENDENT_AMBULATORY_CARE_PROVIDER_SITE_OTHER): Payer: BC Managed Care – PPO | Admitting: Physician Assistant

## 2021-12-13 VITALS — BP 140/83 | HR 68 | Ht 61.0 in | Wt 190.4 lb

## 2021-12-13 DIAGNOSIS — N62 Hypertrophy of breast: Secondary | ICD-10-CM

## 2021-12-13 MED ORDER — ONDANSETRON 4 MG PO TBDP: 4.0000 mg | ORAL_TABLET | Freq: Three times a day (TID) | ORAL | 0 refills | Status: DC | PRN

## 2021-12-13 MED ORDER — OXYCODONE HCL 5 MG PO TABS: 5.0000 mg | ORAL_TABLET | Freq: Four times a day (QID) | ORAL | 0 refills | Status: AC | PRN

## 2021-12-13 NOTE — H&P (View-Only) (Signed)
Patient ID: Carla Rogers, female    DOB: Jan 12, 1976, 46 y.o.   MRN: [id number]  Chief Complaint  Patient presents with   Pre-op Exam      ICD-10-CM   1. Breast hypertrophy  N62        History of Present Illness: Carla Rogers is a 46 y.o.  female  with a history of macromastia.  She presents for preoperative evaluation for upcoming procedure, bilateral breast reduction, scheduled for 12/29/2021 with Dr.  Erin Hearing .  The patient has not had problems with anesthesia.  Patient denies any personal history of blood clots, cancer, MI, or CVA.  Her father had a CVA and MI, but no other family history of blood clots or clotting disorder.  She will hold her Mounjaro 1 week before surgery.  Hold her Mobic and any other NSAIDs 72 hours before surgery.  She will hold all vitamins and supplements 5 days prior to surgery.  She will hold her dextroamphetamine day of surgery.  She uses CPAP at night for OSA.  Confirms that she is a 38 DDD or DDD cup and would like to be a C cup postoperatively.  Understands possibility of free nipple graft as well as postoperative drains.  She endorses leg swelling and varicosities.  Summary of Previous Visit: Patient was seen for consult 08/09/2021.  She complained of neck and shoulder discomfort in the context of large breasts.  She also endorsed history of T12 compression fracture exacerbated by large breast size.  STN 38 cm each side.  Estimated excess breast tissue removed at time of surgery equals 750 g each side.  She completed physical therapy and had a negative urine cotinine test.  After discussing breast reduction surgery, patient expressed that she would like to proceed.  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  PMH Significant for: Macromastia, type II DM with most recent A1c 6.0%, tobacco use disorder with negative screen 09/2021, HTN, HLD, OSA with CPAP, right hip arthropathy, anxiety.   Past Medical History: Allergies: Allergies  Allergen  Reactions   Pneumococcal Vaccines Shortness Of Breath   Codeine    Macrodantin [Nitrofurantoin Macrocrystal] Rash and Other (See Comments)    dizziness   Mobic [Meloxicam] Rash   Sulfa Antibiotics Rash    Current Medications:  Current Outpatient Medications:    ascorbic acid (VITAMIN C) 500 MG tablet, Take 500 mg by mouth daily., Disp: , Rfl:    buPROPion (WELLBUTRIN XL) 300 MG 24 hr tablet, Take 300 mg by mouth daily., Disp: , Rfl:    escitalopram (LEXAPRO) 20 MG tablet, Take 20 mg by mouth every evening., Disp: , Rfl:    levonorgestrel (LILETTA, 52 MG,) 19.5 MCG/DAY IUD IUD, 1 each by Intrauterine route once., Disp: , Rfl:    LORazepam (ATIVAN) 1 MG tablet, Take 1 mg by mouth daily as needed for anxiety or sleep. , Disp: , Rfl:    metFORMIN (GLUCOPHAGE) 500 MG tablet, Take 500 mg by mouth 2 (two) times daily with a meal. , Disp: , Rfl:    MOUNJARO 2.5 MG/0.5ML Pen, SMARTSIG:2.5 Milligram(s) SUB-Q Once a Week, Disp: , Rfl:    Multiple Vitamin (THERA) TABS, Take 1 tablet by mouth every morning. Chewable, Disp: , Rfl:    ondansetron (ZOFRAN-ODT) 4 MG disintegrating tablet, Take 1 tablet (4 mg total) by mouth every 8 (eight) hours as needed for nausea or vomiting., Disp: 20 tablet, Rfl: 0   oxyCODONE (ROXICODONE) 5 MG immediate release tablet,  Take 1 tablet (5 mg total) by mouth every 6 (six) hours as needed for up to 5 days for severe pain., Disp: 20 tablet, Rfl: 0   solifenacin (VESICARE) 10 MG tablet, Take by mouth daily., Disp: , Rfl:    Cholecalciferol (VITAMIN D) 125 MCG (5000 UT) CAPS, Take 1 capsule by mouth daily., Disp: , Rfl:    losartan-hydrochlorothiazide (HYZAAR) 100-25 MG tablet, Take 1 tablet by mouth daily., Disp: , Rfl:    Magnesium 400 MG CAPS, Take 1 capsule by mouth daily., Disp: , Rfl:    rosuvastatin (CRESTOR) 20 MG tablet, Take 20 mg by mouth every evening., Disp: , Rfl:   Past Medical Problems: Past Medical History:  Diagnosis Date   ADHD    Anxiety    Back  pain    Complication of anesthesia    slow to wake up    Depression    Diabetes mellitus without complication (HCC)    GERD (gastroesophageal reflux disease)    Hyperlipidemia    Hypertension    Joint pain    Knee pain    Multiple food allergies    (red onions- Headaches)   Osteoarthritis    Overactive bladder    Palpitations    PONV (postoperative nausea and vomiting)    Sleep apnea    cpap    Vitamin D deficiency     Past Surgical History: Past Surgical History:  Procedure Laterality Date   CHOLECYSTECTOMY     COLONOSCOPY     exploratory vaginal surgery      LAPAROSCOPIC GASTRIC SLEEVE RESECTION N/A 03/24/2015   Procedure: LAPAROSCOPIC GASTRIC SLEEVE RESECTION UPPER ENDOSCOPY;  Surgeon: Alphonsa Overall, MD;  Location: [facility];  Service: General;  Laterality: N/A;   LAPAROSCOPY     pilonidal cyst removal      UPPER GI ENDOSCOPY  03/24/2015   Procedure: UPPER GI ENDOSCOPY;  Surgeon: Alphonsa Overall, MD;  Location: [facility];  Service: General;;    Social History: Social History   Socioeconomic History   Marital status: Married    Spouse name: Gerald Stabs   Number of children: 3   Years of education: Not on file   Highest education level: Not on file  Occupational History   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  Tobacco Use   Smoking status: Former    Packs/day: 1.50    Years: 15.00    Pack years: 22.50    Types: Cigarettes, E-cigarettes    Quit date: 08/01/2008    Years since quitting: 13.3   Smokeless tobacco: Never  Substance and Sexual Activity   Alcohol use: Yes    Comment: occasional    Drug use: No   Sexual activity: Not on file  Other Topics Concern   Not on file  Social History Narrative   Not on file   Social Determinants of Health   Financial Resource Strain: Not on file  Food Insecurity: Not on file  Transportation Needs: Not on file  Physical Activity: Not on file  Stress: Not on file  Social Connections: Not on file  Intimate Partner Violence: Not on  file    Family History: Family History  Problem Relation Age of Onset   Hypertension Mother    Hyperlipidemia Mother    Sudden death Mother    Depression Mother    Diabetes Father    Hypertension Father    Hyperlipidemia Father    Heart disease Father    Stroke Father    Anxiety disorder Father  Sleep apnea Father    Alcoholism Father    Obesity Father     Review of Systems: ROS Denies any recent chest pain, difficulty breathing, or fevers.  Physical Exam: Vital Signs BP 140/83 (BP Location: Left Arm, Patient Position: Sitting, Cuff Size: Large)   Pulse 68   Ht 5\' 1"  (1.549 m)   Wt 190 lb 6.4 oz (86.4 kg)   SpO2 96%   BMI 35.98 kg/m   Physical Exam Constitutional:      General: Not in acute distress.    Appearance: Normal appearance. Not ill-appearing.  HENT:     Head: Normocephalic and atraumatic.  Eyes:     Pupils: Pupils are equal, round. Cardiovascular:     Rate and Rhythm: Normal rate.    Pulses: Normal pulses.  Pulmonary:     Effort: No respiratory distress or increased work of breathing.  Speaks in full sentences. Abdominal:     General: Abdomen is flat. No distension.   Musculoskeletal: Normal range of motion.  Scattered varicosities.  Bilateral lower extremity swelling, no pitting edema. Skin:    General: Skin is warm and dry.     Findings: No erythema or rash.  Neurological:     Mental Status: Alert and oriented to person, place, and time.  Psychiatric:        Mood and Affect: Mood normal.        Behavior: Behavior normal.    Assessment/Plan: The patient is scheduled for bilateral breast reduction with Dr. .  Risks, benefits, and alternatives of procedure discussed, questions answered and consent obtained.    Smoking Status: Quit all smoking and other nicotine-containing products 08/20/2021; Counseling Given?  She understands risks of nicotine with regard to postoperative infections, poor wound healing, and other potential  complications.  Does not plan to resume.  Negative nicotine test 09/2021. Last Mammogram: 06/2021 with [facility]; Results: Cannot review in EMR, but patient reports negative.  Caprini Score: 6; Risk Factors include: Age, BMI greater than 25, leg swelling, varicosities, and length of planned surgery.  Liletta IUD, no estrogen.  Recommendation for mechanical prophylaxis. Encourage early ambulation.   Pictures obtained: 08/09/2021  Post-op Rx sent to pharmacy: Oxycodone, Zofran.  Patient was provided with the General Surgical Risk consent document and Pain Medication Agreement prior to their appointment.  They had adequate time to read through the risk consent documents and Pain Medication Agreement. We also discussed them in person together during this preop appointment. All of their questions were answered to their satisfaction.  Recommended calling if they have any further questions.  Risk consent form and Pain Medication Agreement to be scanned into patient's chart.  The risk that can be encountered with breast reduction were discussed and include the following but not limited to these:  Breast asymmetry, fluid accumulation, firmness of the breast, inability to breast feed, loss of nipple or areola, skin loss, decrease or no nipple sensation, fat necrosis of the breast tissue, bleeding, infection, healing delay.  There are risks of anesthesia, changes to skin sensation and injury to nerves or blood vessels.  The muscle can be temporarily or permanently injured.  You may have an allergic reaction to tape, suture, glue, blood products which can result in skin discoloration, swelling, pain, skin lesions, poor healing.  Any of these can lead to the need for revisonal surgery or stage procedures.  A reduction has potential to interfere with diagnostic procedures.  Nipple or breast piercing can increase risks of infection.  This procedure  is best done when the breast is fully developed.  Changes in the breast will  continue to occur over time.  Pregnancy can alter the outcomes of previous breast reduction surgery, weight gain and weigh loss can also effect the long term appearance.   We discussed the possibility of amputation/free nipple graft technique due to the length of her STN.  She is understanding of the possibility that we would need to transition from a pedicle technique to a free nipple graft technique intraoperatively.  We discussed the risks associated with free nipple graft breast reductions, including but not limited to failure of the graft, partial loss of the graft, loss of sensation of bilateral nipple areola, complete loss of the nipple areola graft, inability to breast-feed, postoperative wounds, ongoing wound care.  We also discussed the risks associated with the pedicle technique.  We discussed that with the pedicle technique she could develop nipple areolar necrosis which would result in loss of the nipple, this would also result in ongoing wound care and possible changes in the shape of her breast.     Electronically signed by: Krista Blue, PA-C 12/13/2021 12:10 PM

## 2021-12-13 NOTE — Progress Notes (Addendum)
Patient ID: Carla Rogers, female    DOB: Jan 12, 1976, 46 y.o.   MRN: [id number]  Chief Complaint  Patient presents with   Pre-op Exam      ICD-10-CM   1. Breast hypertrophy  N62        History of Present Illness: Carla Rogers is a 46 y.o.  female  with a history of macromastia.  She presents for preoperative evaluation for upcoming procedure, bilateral breast reduction, scheduled for 12/29/2021 with Dr.  Erin Hearing .  The patient has not had problems with anesthesia.  Patient denies any personal history of blood clots, cancer, MI, or CVA.  Her father had a CVA and MI, but no other family history of blood clots or clotting disorder.  She will hold her Mounjaro 1 week before surgery.  Hold her Mobic and any other NSAIDs 72 hours before surgery.  She will hold all vitamins and supplements 5 days prior to surgery.  She will hold her dextroamphetamine day of surgery.  She uses CPAP at night for OSA.  Confirms that she is a 38 DDD or DDD cup and would like to be a C cup postoperatively.  Understands possibility of free nipple graft as well as postoperative drains.  She endorses leg swelling and varicosities.  Summary of Previous Visit: Patient was seen for consult 08/09/2021.  She complained of neck and shoulder discomfort in the context of large breasts.  She also endorsed history of T12 compression fracture exacerbated by large breast size.  STN 38 cm each side.  Estimated excess breast tissue removed at time of surgery equals 750 g each side.  She completed physical therapy and had a negative urine cotinine test.  After discussing breast reduction surgery, patient expressed that she would like to proceed.  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  PMH Significant for: Macromastia, type II DM with most recent A1c 6.0%, tobacco use disorder with negative screen 09/2021, HTN, HLD, OSA with CPAP, right hip arthropathy, anxiety.   Past Medical History: Allergies: Allergies  Allergen  Reactions   Pneumococcal Vaccines Shortness Of Breath   Codeine    Macrodantin [Nitrofurantoin Macrocrystal] Rash and Other (See Comments)    dizziness   Mobic [Meloxicam] Rash   Sulfa Antibiotics Rash    Current Medications:  Current Outpatient Medications:    ascorbic acid (VITAMIN C) 500 MG tablet, Take 500 mg by mouth daily., Disp: , Rfl:    buPROPion (WELLBUTRIN XL) 300 MG 24 hr tablet, Take 300 mg by mouth daily., Disp: , Rfl:    escitalopram (LEXAPRO) 20 MG tablet, Take 20 mg by mouth every evening., Disp: , Rfl:    levonorgestrel (LILETTA, 52 MG,) 19.5 MCG/DAY IUD IUD, 1 each by Intrauterine route once., Disp: , Rfl:    LORazepam (ATIVAN) 1 MG tablet, Take 1 mg by mouth daily as needed for anxiety or sleep. , Disp: , Rfl:    metFORMIN (GLUCOPHAGE) 500 MG tablet, Take 500 mg by mouth 2 (two) times daily with a meal. , Disp: , Rfl:    MOUNJARO 2.5 MG/0.5ML Pen, SMARTSIG:2.5 Milligram(s) SUB-Q Once a Week, Disp: , Rfl:    Multiple Vitamin (THERA) TABS, Take 1 tablet by mouth every morning. Chewable, Disp: , Rfl:    ondansetron (ZOFRAN-ODT) 4 MG disintegrating tablet, Take 1 tablet (4 mg total) by mouth every 8 (eight) hours as needed for nausea or vomiting., Disp: 20 tablet, Rfl: 0   oxyCODONE (ROXICODONE) 5 MG immediate release tablet,  Take 1 tablet (5 mg total) by mouth every 6 (six) hours as needed for up to 5 days for severe pain., Disp: 20 tablet, Rfl: 0   solifenacin (VESICARE) 10 MG tablet, Take by mouth daily., Disp: , Rfl:    Cholecalciferol (VITAMIN D) 125 MCG (5000 UT) CAPS, Take 1 capsule by mouth daily., Disp: , Rfl:    losartan-hydrochlorothiazide (HYZAAR) 100-25 MG tablet, Take 1 tablet by mouth daily., Disp: , Rfl:    Magnesium 400 MG CAPS, Take 1 capsule by mouth daily., Disp: , Rfl:    rosuvastatin (CRESTOR) 20 MG tablet, Take 20 mg by mouth every evening., Disp: , Rfl:   Past Medical Problems: Past Medical History:  Diagnosis Date   ADHD    Anxiety    Back  pain    Complication of anesthesia    slow to wake up    Depression    Diabetes mellitus without complication (HCC)    GERD (gastroesophageal reflux disease)    Hyperlipidemia    Hypertension    Joint pain    Knee pain    Multiple food allergies    (red onions- Headaches)   Osteoarthritis    Overactive bladder    Palpitations    PONV (postoperative nausea and vomiting)    Sleep apnea    cpap    Vitamin D deficiency     Past Surgical History: Past Surgical History:  Procedure Laterality Date   CHOLECYSTECTOMY     COLONOSCOPY     exploratory vaginal surgery      LAPAROSCOPIC GASTRIC SLEEVE RESECTION N/A 03/24/2015   Procedure: LAPAROSCOPIC GASTRIC SLEEVE RESECTION UPPER ENDOSCOPY;  Surgeon: Alphonsa Overall, MD;  Location: [facility];  Service: General;  Laterality: N/A;   LAPAROSCOPY     pilonidal cyst removal      UPPER GI ENDOSCOPY  03/24/2015   Procedure: UPPER GI ENDOSCOPY;  Surgeon: Alphonsa Overall, MD;  Location: [facility];  Service: General;;    Social History: Social History   Socioeconomic History   Marital status: Married    Spouse name: Gerald Stabs   Number of children: 3   Years of education: Not on file   Highest education level: Not on file  Occupational History   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  Tobacco Use   Smoking status: Former    Packs/day: 1.50    Years: 15.00    Pack years: 22.50    Types: Cigarettes, E-cigarettes    Quit date: 08/01/2008    Years since quitting: 13.3   Smokeless tobacco: Never  Substance and Sexual Activity   Alcohol use: Yes    Comment: occasional    Drug use: No   Sexual activity: Not on file  Other Topics Concern   Not on file  Social History Narrative   Not on file   Social Determinants of Health   Financial Resource Strain: Not on file  Food Insecurity: Not on file  Transportation Needs: Not on file  Physical Activity: Not on file  Stress: Not on file  Social Connections: Not on file  Intimate Partner Violence: Not on  file    Family History: Family History  Problem Relation Age of Onset   Hypertension Mother    Hyperlipidemia Mother    Sudden death Mother    Depression Mother    Diabetes Father    Hypertension Father    Hyperlipidemia Father    Heart disease Father    Stroke Father    Anxiety disorder Father  Sleep apnea Father    Alcoholism Father    Obesity Father     Review of Systems: ROS Denies any recent chest pain, difficulty breathing, or fevers.  Physical Exam: Vital Signs BP 140/83 (BP Location: Left Arm, Patient Position: Sitting, Cuff Size: Large)   Pulse 68   Ht 5' 1" (1.549 m)   Wt 190 lb 6.4 oz (86.4 kg)   SpO2 96%   BMI 35.98 kg/m   Physical Exam Constitutional:      General: Not in acute distress.    Appearance: Normal appearance. Not ill-appearing.  HENT:     Head: Normocephalic and atraumatic.  Eyes:     Pupils: Pupils are equal, round. Cardiovascular:     Rate and Rhythm: Normal rate.    Pulses: Normal pulses.  Pulmonary:     Effort: No respiratory distress or increased work of breathing.  Speaks in full sentences. Abdominal:     General: Abdomen is flat. No distension.   Musculoskeletal: Normal range of motion.  Scattered varicosities.  Bilateral lower extremity swelling, no pitting edema. Skin:    General: Skin is warm and dry.     Findings: No erythema or rash.  Neurological:     Mental Status: Alert and oriented to person, place, and time.  Psychiatric:        Mood and Affect: Mood normal.        Behavior: Behavior normal.    Assessment/Plan: The patient is scheduled for bilateral breast reduction with Dr. Luppens.  Risks, benefits, and alternatives of procedure discussed, questions answered and consent obtained.    Smoking Status: Quit all smoking and other nicotine-containing products 08/20/2021; Counseling Given?  She understands risks of nicotine with regard to postoperative infections, poor wound healing, and other potential  complications.  Does not plan to resume.  Negative nicotine test 09/2021. Last Mammogram: 06/2021 with [facility]; Results: Cannot review in EMR, but patient reports negative.  Caprini Score: 6; Risk Factors include: Age, BMI greater than 25, leg swelling, varicosities, and length of planned surgery.  Liletta IUD, no estrogen.  Recommendation for mechanical prophylaxis. Encourage early ambulation.   Pictures obtained: 08/09/2021  Post-op Rx sent to pharmacy: Oxycodone, Zofran.  Patient was provided with the General Surgical Risk consent document and Pain Medication Agreement prior to their appointment.  They had adequate time to read through the risk consent documents and Pain Medication Agreement. We also discussed them in person together during this preop appointment. All of their questions were answered to their satisfaction.  Recommended calling if they have any further questions.  Risk consent form and Pain Medication Agreement to be scanned into patient's chart.  The risk that can be encountered with breast reduction were discussed and include the following but not limited to these:  Breast asymmetry, fluid accumulation, firmness of the breast, inability to breast feed, loss of nipple or areola, skin loss, decrease or no nipple sensation, fat necrosis of the breast tissue, bleeding, infection, healing delay.  There are risks of anesthesia, changes to skin sensation and injury to nerves or blood vessels.  The muscle can be temporarily or permanently injured.  You may have an allergic reaction to tape, suture, glue, blood products which can result in skin discoloration, swelling, pain, skin lesions, poor healing.  Any of these can lead to the need for revisonal surgery or stage procedures.  A reduction has potential to interfere with diagnostic procedures.  Nipple or breast piercing can increase risks of infection.  This procedure   is best done when the breast is fully developed.  Changes in the breast will  continue to occur over time.  Pregnancy can alter the outcomes of previous breast reduction surgery, weight gain and weigh loss can also effect the long term appearance.   We discussed the possibility of amputation/free nipple graft technique due to the length of her STN.  She is understanding of the possibility that we would need to transition from a pedicle technique to a free nipple graft technique intraoperatively.  We discussed the risks associated with free nipple graft breast reductions, including but not limited to failure of the graft, partial loss of the graft, loss of sensation of bilateral nipple areola, complete loss of the nipple areola graft, inability to breast-feed, postoperative wounds, ongoing wound care.  We also discussed the risks associated with the pedicle technique.  We discussed that with the pedicle technique she could develop nipple areolar necrosis which would result in loss of the nipple, this would also result in ongoing wound care and possible changes in the shape of her breast.     Electronically signed by: Krista Blue, PA-C 12/13/2021 12:10 PM

## 2021-12-20 NOTE — Progress Notes (Signed)
Surgical Instructions    Your procedure is scheduled on Wednesday, May 31st, 2023.   Report to [facility] Main Entrance "A" at 09:45 A.M., then check in with the Admitting office.  Call this number if you have problems the morning of surgery:  [phone]   If you have any questions prior to your surgery date call [phone]: Open Monday-Friday 8am-4pm    Remember:  Do not eat after midnight the night before your surgery  You may drink clear liquids until 08"45 the morning of your surgery.   Clear liquids allowed are: Water, Non-Citrus Juices (without pulp), Carbonated Beverages, Clear Tea, Black Coffee ONLY (NO MILK, CREAM OR POWDERED CREAMER of any kind), and Gatorade    Take these medicines the morning of surgery with A SIP OF WATER:   buPROPion (WELLBUTRIN XL)  rosuvastatin (CRESTOR)  solifenacin (VESICARE)  If needed:  cetirizine (ZYRTEC) cyclobenzaprine (FLEXERIL)  HYDROcodone-acetaminophen (NORCO/VICODIN) LORazepam (ATIVAN)   ondansetron (ZOFRAN-ODT)   As of today, STOP taking any Aspirin (unless otherwise instructed by your surgeon) Aleve, Naproxen, Ibuprofen, Motrin, Advil, Goody's, BC's, all herbal medications, fish oil, and all vitamins.   WHAT DO I DO ABOUT MY DIABETES MEDICATION?   Do not take metFORMIN (GLUCOPHAGE) the morning of surgery.  Do not take tirzepatide [facility]) the morning of surgery.  The day ofsurgery: HOW TO MANAGE YOUR DIABETES BEFORE AND AFTER SURGERY  Why is it important to control my blood sugar before and after surgery? Improving blood sugar levels before and after surgery helps healing and can limit problems. A way of improving blood sugar control is eating a healthy diet by:  Eating less sugar and carbohydrates  Increasing activity/exercise  Talking with your doctor about reaching your blood sugar goals High blood sugars (greater than 180 mg/dL) can raise your risk of infections and slow your recovery, so you will need to  focus on controlling your diabetes during the weeks before surgery. Make sure that the doctor who takes care of your diabetes knows about your planned surgery including the date and location.  How do I manage my blood sugar before surgery? Check your blood sugar at least 4 times a day, starting 2 days before surgery, to make sure that the level is not too high or low.  Check your blood sugar the morning of your surgery when you wake up and every 2 hours until you get to the Short Stay unit.  If your blood sugar is less than 70 mg/dL, you will need to treat for low blood sugar: Do not take insulin. Treat a low blood sugar (less than 70 mg/dL) with  cup of clear juice (cranberry or apple), 4 glucose tablets, OR glucose gel. Recheck blood sugar in 15 minutes after treatment (to make sure it is greater than 70 mg/dL). If your blood sugar is not greater than 70 mg/dL on recheck, call [phone] for further instructions. Report your blood sugar to the short stay nurse when you get to Short Stay.  If you are admitted to the hospital after surgery: Your blood sugar will be checked by the staff and you will probably be given insulin after surgery (instead of oral diabetes medicines) to make sure you have good blood sugar levels. The goal for blood sugar control after surgery is 80-180 mg/dL.   The day of surgery:          Do not wear jewelry or makeup Do not wear lotions, powders, perfumes, or deodorant. Do not shave 48 hours prior  to surgery.   Do not bring valuables to the hospital. Do not wear nail polish, gel polish, artificial nails, or any other type of covering on natural nails (fingers and toes) If you have artificial nails or gel coating that need to be removed by a nail salon, please have this removed prior to surgery. Artificial nails or gel coating may interfere with anesthesia's ability to adequately monitor your vital signs.   [facility] is not responsible for any belongings or  valuables. .   Do NOT Smoke (Tobacco/Vaping)  24 hours prior to your procedure  If you use a CPAP at night, you may bring your mask for your overnight stay.   Contacts, glasses, hearing aids, dentures or partials may not be worn into surgery, please bring cases for these belongings   For patients admitted to the hospital, discharge time will be determined by your treatment team.   Patients discharged the day of surgery will not be allowed to drive home, and someone needs to stay with them for 24 hours.   SURGICAL WAITING ROOM VISITATION Patients having surgery or a procedure in a hospital may have two support people. Children under the age of 21 must have an adult with them who is not the patient. They may stay in the waiting area during the procedure and may switch out with other visitors. If the patient needs to stay at the hospital during part of their recovery, the visitor guidelines for inpatient rooms apply.  Please refer to the [facility] website for the visitor guidelines for Inpatients (after your surgery is over and you are in a regular room).    Special instructions:    Oral Hygiene is also important to reduce your risk of infection.  Remember - BRUSH YOUR TEETH THE MORNING OF SURGERY WITH YOUR REGULAR TOOTHPASTE   [facility]- Preparing For Surgery  Before surgery, you can play an important role. Because skin is not sterile, your skin needs to be as free of germs as possible. You can reduce the number of germs on your skin by washing with CHG (chlorahexidine gluconate) Soap before surgery.  CHG is an antiseptic cleaner which kills germs and bonds with the skin to continue killing germs even after washing.     Please do not use if you have an allergy to CHG or antibacterial soaps. If your skin becomes reddened/irritated stop using the CHG.  Do not shave (including legs and underarms) for at least 48 hours prior to first CHG shower. It is OK to shave your face.  Please  follow these instructions carefully.     Shower the NIGHT BEFORE SURGERY and the MORNING OF SURGERY with CHG Soap.   If you chose to wash your hair, wash your hair first as usual with your normal shampoo. After you shampoo, rinse your hair and body thoroughly to remove the shampoo.  Then [employer] and genitals (private parts) with your normal soap and rinse thoroughly to remove soap.  After that Use CHG Soap as you would any other liquid soap. You can apply CHG directly to the skin and wash gently with a scrungie or a clean washcloth.   Apply the CHG Soap to your body ONLY FROM THE NECK DOWN.  Do not use on open wounds or open sores. Avoid contact with your eyes, ears, mouth and genitals (private parts). Wash Face and genitals (private parts)  with your normal soap.   Wash thoroughly, paying special attention to the area where your surgery  will be performed.  Thoroughly rinse your body with warm water from the neck down.  DO NOT shower/wash with your normal soap after using and rinsing off the CHG Soap.  Pat yourself dry with a CLEAN TOWEL.  Wear CLEAN PAJAMAS to bed the night before surgery  Place CLEAN SHEETS on your bed the night before your surgery  DO NOT SLEEP WITH PETS.   Day of Surgery:  Take a shower with CHG soap. Wear Clean/Comfortable clothing the morning of surgery Do not apply any deodorants/lotions.   Remember to brush your teeth WITH YOUR REGULAR TOOTHPASTE.    If you received a COVID test during your pre-op visit, it is requested that you wear a mask when out in public, stay away from anyone that may not be feeling well, and notify your surgeon if you develop symptoms. If you have been in contact with anyone that has tested positive in the last 10 days, please notify your surgeon.    Please read over the following fact sheets that you were given.

## 2021-12-21 ENCOUNTER — Other Ambulatory Visit: Payer: Self-pay

## 2021-12-21 ENCOUNTER — Encounter (HOSPITAL_COMMUNITY)
Admission: RE | Admit: 2021-12-21 | Discharge: 2021-12-21 | Disposition: A | Discharge status: Home / Self Care | Payer: BC Managed Care – PPO | Source: Ambulatory Visit | Attending: Plastic Surgery | Admitting: Plastic Surgery

## 2021-12-21 ENCOUNTER — Encounter (HOSPITAL_COMMUNITY): Payer: Self-pay

## 2021-12-21 VITALS — BP 156/83 | HR 73 | Temp 98.2°F | Resp 18 | Ht 61.0 in | Wt 187.3 lb

## 2021-12-21 DIAGNOSIS — E119 Type 2 diabetes mellitus without complications: Secondary | ICD-10-CM | POA: Insufficient documentation

## 2021-12-21 DIAGNOSIS — N3281 Overactive bladder: Secondary | ICD-10-CM | POA: Insufficient documentation

## 2021-12-21 DIAGNOSIS — E669 Obesity, unspecified: Secondary | ICD-10-CM | POA: Diagnosis not present

## 2021-12-21 DIAGNOSIS — G4733 Obstructive sleep apnea (adult) (pediatric): Secondary | ICD-10-CM | POA: Diagnosis not present

## 2021-12-21 DIAGNOSIS — I10 Essential (primary) hypertension: Secondary | ICD-10-CM | POA: Diagnosis not present

## 2021-12-21 DIAGNOSIS — E785 Hyperlipidemia, unspecified: Secondary | ICD-10-CM | POA: Diagnosis not present

## 2021-12-21 DIAGNOSIS — Z01818 Encounter for other preprocedural examination: Secondary | ICD-10-CM | POA: Diagnosis present

## 2021-12-21 DIAGNOSIS — K219 Gastro-esophageal reflux disease without esophagitis: Secondary | ICD-10-CM | POA: Insufficient documentation

## 2021-12-21 LAB — BASIC METABOLIC PANEL
Anion gap: 8 (ref 5–15)
BUN: 6 mg/dL (ref 6–20)
CO2: 26 mmol/L (ref 22–32)
Calcium: 9.2 mg/dL (ref 8.9–10.3)
Chloride: 103 mmol/L (ref 98–111)
Creatinine, Ser: 0.74 mg/dL (ref 0.44–1.00)
GFR, Estimated: >60 mL/min (ref >60)
Glucose, Bld: 97 mg/dL (ref 70–99)
Potassium: 3.7 mmol/L (ref 3.5–5.1)
Sodium: 137 mmol/L (ref 135–145)

## 2021-12-21 LAB — CBC
HCT: 40.7 % (ref 36.0–46.0)
Hemoglobin: 13.6 g/dL (ref 12.0–15.0)
MCH: 30.3 pg (ref 26.0–34.0)
MCHC: 33.4 g/dL (ref 30.0–36.0)
MCV: 90.6 fL (ref 80.0–100.0)
Platelets: 197 10*3/uL (ref 150–400)
RBC: 4.49 MIL/uL (ref 3.87–5.11)
RDW: 13.1 % (ref 11.5–15.5)
WBC: 5.5 10*3/uL (ref 4.0–10.5)
nRBC: 0 % (ref 0.0–0.2)

## 2021-12-21 LAB — HEMOGLOBIN A1C
Hgb A1c MFr Bld: 5.9 % — ABNORMAL HIGH (ref 4.8–5.6)
Mean Plasma Glucose: 122.63 mg/dL

## 2021-12-21 LAB — GLUCOSE, CAPILLARY: Glucose-Capillary: 105 mg/dL — ABNORMAL HIGH (ref 70–99)

## 2021-12-21 NOTE — Progress Notes (Addendum)
PCP - Pablo Lawrence, AGNP Cardiologist - denies  PPM/ICD - denies Device Orders - n/a Rep Notified - n/a  Chest x-ray - n/a EKG - 12/21/2021 Stress Test - denies ECHO - denies Cardiac Cath - denies  Sleep Study - yes, positive for OSA CPAP - yes - sometimes  Fasting Blood Sugar - patient is not checking CBG at home CBG today - 105 A1C - done in PAT on 12/21/2021  Blood Thinner Instructions: n/a  Aspirin Instructions: Patient was instructed: As of today, STOP taking any Aspirin (unless otherwise instructed by your surgeon) Aleve, Naproxen, Ibuprofen, Motrin, Advil, Goody's, BC's, all herbal medications, fish oil, and all vitamins.  ERAS Protcol - yes, until 08:45 o'clock  COVID TEST- n/a   Anesthesia review: yes - slow to wake up after anesthesia; EKG review  Patient denies shortness of breath, fever, cough and chest pain at PAT appointment   All instructions explained to the patient, with a verbal understanding of the material. Patient agrees to go over the instructions while at home for a better understanding. Patient also instructed to self quarantine after being tested for COVID-19. The opportunity to ask questions was provided.

## 2021-12-22 NOTE — Progress Notes (Signed)
Anesthesia Chart Review:  Case: [id number] Date/Time: 12/29/21 1130   Procedure: MAMMARY REDUCTION  (BREAST) (Bilateral: Breast)   Anesthesia type: General   Pre-op diagnosis: Breast Hypertrophy   [facility] ROOM 04 / [facility] OR   Surgeons: [name], MD       DISCUSSION: Patient is a 46 year old female scheduled for the above procedure.  History includes former smoker (quit 08/01/08), post-operative N/V, DM2, HTN, HLD, palpitations, GERD, OSA (inconsistent use of CPAP), overactive bladder, ADHD, obesity (laparoscopic gastric sleeve resection 03/24/15).  Reported being "slow to wake up" after anesthesia.  PAT labs and EKG acceptable for OR.  Anesthesia team to evaluate on the day of surgery.  VS: BP (!) 156/83   Pulse 73   Temp 36.8 C (Oral)   Resp 18   Ht 5\' 1"  (1.549 m)   Wt 85 kg   SpO2 100%   BMI 35.39 kg/m   PROVIDERS: , NP is PCP Roe Rutherford)    LABS: Labs reviewed: Acceptable for surgery. (all labs ordered are listed, but only abnormal results are displayed)  Labs Reviewed  GLUCOSE, CAPILLARY - Abnormal; Notable for the following components:      Result Value   Glucose-Capillary 105 (*)    All other components within normal limits  HEMOGLOBIN A1C - Abnormal; Notable for the following components:   Hgb A1c MFr Bld 5.9 (*)    All other components within normal limits  BASIC METABOLIC PANEL  CBC    Home Sleep Study 06/11/21: IMPRESSIONS: Moderate to severe obstructive sleep apnea/hypopnea syndrome, AHI 29.3/h.  Snoring with oxygen desaturation to a nadir of 74% with average 92%.  Suggest CPAP titration sleep study or AutoPap.  Other options would be based on clinical judgment.   EKG: 12/21/21: NSR   CV: N/A  Past Medical History:  Diagnosis Date   ADHD    Anxiety    Back pain    Complication of anesthesia    slow to wake up    Depression    Diabetes mellitus without complication (HCC)    GERD (gastroesophageal reflux disease)     Hyperlipidemia    Hypertension    Joint pain    Knee pain    Multiple food allergies    (red onions- Headaches)   Osteoarthritis    Overactive bladder    Palpitations    PONV (postoperative nausea and vomiting)    Sleep apnea    cpap    Vitamin D deficiency     Past Surgical History:  Procedure Laterality Date   CHOLECYSTECTOMY     COLONOSCOPY     exploratory vaginal surgery      LAPAROSCOPIC GASTRIC SLEEVE RESECTION N/A 03/24/2015   Procedure: LAPAROSCOPIC GASTRIC SLEEVE RESECTION UPPER ENDOSCOPY;  Surgeon: 03/26/2015, MD;  Location: [facility] ORS;  Service: General;  Laterality: N/A;   LAPAROSCOPY     pilonidal cyst removal      UPPER GI ENDOSCOPY  03/24/2015   Procedure: UPPER GI ENDOSCOPY;  Surgeon: 03/26/2015, MD;  Location: [facility] ORS;  Service: General;;    MEDICATIONS:  amphetamine-dextroamphetamine (ADDERALL) 20 MG tablet   ascorbic acid (VITAMIN C) 500 MG tablet   buPROPion (WELLBUTRIN XL) 300 MG 24 hr tablet   cetirizine (ZYRTEC) 10 MG tablet   cholecalciferol (VITAMIN D3) 25 MCG (1000 UNIT) tablet   cyclobenzaprine (FLEXERIL) 10 MG tablet   docusate sodium (COLACE) 100 MG capsule   escitalopram (LEXAPRO) 20 MG tablet   HYDROcodone-acetaminophen (NORCO/VICODIN) 5-325 MG tablet  ibuprofen (ADVIL) 200 MG tablet   levonorgestrel (LILETTA, 52 MG,) 19.5 MCG/DAY IUD IUD   LORazepam (ATIVAN) 1 MG tablet   losartan-hydrochlorothiazide (HYZAAR) 100-12.5 MG tablet   Magnesium 250 MG TABS   meloxicam (MOBIC) 15 MG tablet   metFORMIN (GLUCOPHAGE) 500 MG tablet   ondansetron (ZOFRAN-ODT) 4 MG disintegrating tablet   OVER THE COUNTER MEDICATION   Pediatric Multiple Vitamins (CHILDRENS MULTIVITAMIN) chewable tablet   rosuvastatin (CRESTOR) 10 MG tablet   solifenacin (VESICARE) 10 MG tablet   tirzepatide (MOUNJARO) 7.5 MG/0.5ML Pen   zinc gluconate 50 MG tablet   No current facility-administered medications for this encounter.    Shonna Chock, PA-C Surgical Short  Stay/Anesthesiology [facility] Phone [phone] [facility] Phone [phone] 12/22/2021 3:07 PM

## 2021-12-22 NOTE — Anesthesia Preprocedure Evaluation (Addendum)
Anesthesia Evaluation  Patient identified by MRN, date of birth, ID band Patient awake    Reviewed: Allergy & Precautions, NPO status , Patient's Chart, lab work & pertinent test results  History of Anesthesia Complications Negative for: history of anesthetic complications  Airway Mallampati: III  TM Distance: >3 FB Neck ROM: Full    Dental  (+) Loose, Dental Advisory Given   Pulmonary sleep apnea , former smoker,    Pulmonary exam normal        Cardiovascular hypertension, Pt. on medications Normal cardiovascular exam     Neuro/Psych PSYCHIATRIC DISORDERS Anxiety Depression negative neurological ROS     GI/Hepatic Neg liver ROS, GERD  ,  Endo/Other  diabetesMorbid obesity  Renal/GU negative Renal ROS  negative genitourinary   Musculoskeletal negative musculoskeletal ROS (+)   Abdominal   Peds negative pediatric ROS (+)  Hematology negative hematology ROS (+)   Anesthesia Other Findings   Reproductive/Obstetrics negative OB ROS                           Anesthesia Physical Anesthesia Plan  ASA: 3  Anesthesia Plan: General   Post-op Pain Management: Celebrex PO (pre-op)* and Tylenol PO (pre-op)*   Induction: Intravenous  PONV Risk Score and Plan: 4 or greater and Ondansetron, Dexamethasone, Midazolam and Scopolamine patch - Pre-op  Airway Management Planned: Oral ETT  Additional Equipment:   Intra-op Plan:   Post-operative Plan: Extubation in OR  Informed Consent: I have reviewed the patients History and Physical, chart, labs and discussed the procedure including the risks, benefits and alternatives for the proposed anesthesia with the patient or authorized representative who has indicated his/her understanding and acceptance.     Dental advisory given  Plan Discussed with: CRNA and Anesthesiologist  Anesthesia Plan Comments: (PAT note written 12/22/2021 by Shonna Chock, PA-C. )       Anesthesia Quick Evaluation

## 2021-12-29 ENCOUNTER — Encounter (HOSPITAL_COMMUNITY): Payer: Self-pay | Admitting: Plastic Surgery

## 2021-12-29 ENCOUNTER — Ambulatory Visit (HOSPITAL_COMMUNITY)
Admission: RE | Admit: 2021-12-29 | Discharge: 2021-12-29 | Disposition: A | Discharge status: Home / Self Care | Payer: BC Managed Care – PPO | Source: Ambulatory Visit | Attending: Plastic Surgery | Admitting: Plastic Surgery

## 2021-12-29 ENCOUNTER — Encounter (HOSPITAL_COMMUNITY)
Admission: RE | Disposition: A | Discharge status: Home / Self Care | Payer: Self-pay | Source: Ambulatory Visit | Attending: Plastic Surgery

## 2021-12-29 ENCOUNTER — Ambulatory Visit (HOSPITAL_COMMUNITY): Payer: BC Managed Care – PPO | Admitting: Anesthesiology

## 2021-12-29 ENCOUNTER — Other Ambulatory Visit: Payer: Self-pay

## 2021-12-29 ENCOUNTER — Ambulatory Visit (HOSPITAL_COMMUNITY): Payer: BC Managed Care – PPO | Admitting: Vascular Surgery

## 2021-12-29 DIAGNOSIS — K219 Gastro-esophageal reflux disease without esophagitis: Secondary | ICD-10-CM | POA: Diagnosis not present

## 2021-12-29 DIAGNOSIS — Z6835 Body mass index (BMI) 35.0-35.9, adult: Secondary | ICD-10-CM | POA: Insufficient documentation

## 2021-12-29 DIAGNOSIS — N62 Hypertrophy of breast: Secondary | ICD-10-CM | POA: Diagnosis not present

## 2021-12-29 DIAGNOSIS — E119 Type 2 diabetes mellitus without complications: Secondary | ICD-10-CM | POA: Insufficient documentation

## 2021-12-29 DIAGNOSIS — Z87891 Personal history of nicotine dependence: Secondary | ICD-10-CM | POA: Diagnosis not present

## 2021-12-29 DIAGNOSIS — I10 Essential (primary) hypertension: Secondary | ICD-10-CM | POA: Insufficient documentation

## 2021-12-29 DIAGNOSIS — G4733 Obstructive sleep apnea (adult) (pediatric): Secondary | ICD-10-CM | POA: Insufficient documentation

## 2021-12-29 DIAGNOSIS — Z01818 Encounter for other preprocedural examination: Secondary | ICD-10-CM

## 2021-12-29 DIAGNOSIS — N6041 Mammary duct ectasia of right breast: Secondary | ICD-10-CM | POA: Insufficient documentation

## 2021-12-29 DIAGNOSIS — N6042 Mammary duct ectasia of left breast: Secondary | ICD-10-CM | POA: Insufficient documentation

## 2021-12-29 HISTORY — PX: BREAST REDUCTION SURGERY: SHX8

## 2021-12-29 LAB — GLUCOSE, CAPILLARY
Glucose-Capillary: 68 mg/dL — ABNORMAL LOW (ref 70–99)
Glucose-Capillary: 103 mg/dL — ABNORMAL HIGH (ref 70–99)
Glucose-Capillary: 122 mg/dL — ABNORMAL HIGH (ref 70–99)
Glucose-Capillary: 65 mg/dL — ABNORMAL LOW (ref 70–99)
Glucose-Capillary: 110 mg/dL — ABNORMAL HIGH (ref 70–99)

## 2021-12-29 LAB — POCT PREGNANCY, URINE: Preg Test, Ur: NEGATIVE

## 2021-12-29 SURGERY — MAMMOPLASTY, REDUCTION
Anesthesia: General | Site: Breast | Laterality: Bilateral

## 2021-12-29 MED ORDER — EPHEDRINE 5 MG/ML INJ
INTRAVENOUS | Status: AC
  Filled 2021-12-29: qty 5

## 2021-12-29 MED ORDER — HYDROMORPHONE HCL 1 MG/ML IJ SOLN
INTRAMUSCULAR | Status: AC
  Filled 2021-12-29: qty 0.5

## 2021-12-29 MED ORDER — FENTANYL CITRATE (PF) 250 MCG/5ML IJ SOLN
INTRAMUSCULAR | Status: DC | PRN
  Administered 2021-12-29 (×2): 50 ug via INTRAVENOUS
  Administered 2021-12-29: 150 ug via INTRAVENOUS

## 2021-12-29 MED ORDER — OXYCODONE HCL 5 MG PO TABS
ORAL_TABLET | ORAL | Status: AC
  Filled 2021-12-29: qty 1

## 2021-12-29 MED ORDER — DEXTROSE 50 % IV SOLN
12.5000 g | INTRAVENOUS | Status: AC
  Administered 2021-12-29: 12.5 g via INTRAVENOUS
  Filled 2021-12-29: qty 50

## 2021-12-29 MED ORDER — DEXMEDETOMIDINE (PRECEDEX) IN NS 20 MCG/5ML (4 MCG/ML) IV SYRINGE
PREFILLED_SYRINGE | INTRAVENOUS | Status: DC | PRN
  Administered 2021-12-29: 8 ug via INTRAVENOUS

## 2021-12-29 MED ORDER — DEXMEDETOMIDINE (PRECEDEX) IN NS 20 MCG/5ML (4 MCG/ML) IV SYRINGE
PREFILLED_SYRINGE | INTRAVENOUS | Status: AC
  Filled 2021-12-29: qty 5

## 2021-12-29 MED ORDER — PROPOFOL 10 MG/ML IV BOLUS
INTRAVENOUS | Status: DC | PRN
  Administered 2021-12-29: 20 mg via INTRAVENOUS
  Administered 2021-12-29: 40 mg via INTRAVENOUS
  Administered 2021-12-29: 160 mg via INTRAVENOUS

## 2021-12-29 MED ORDER — DEXTROSE 50 % IV SOLN
INTRAVENOUS | Status: DC | PRN
  Administered 2021-12-29: 12.5 g via INTRAVENOUS

## 2021-12-29 MED ORDER — FENTANYL CITRATE (PF) 100 MCG/2ML IJ SOLN: 25.0000 ug | INTRAMUSCULAR | Status: DC | PRN

## 2021-12-29 MED ORDER — SCOPOLAMINE 1 MG/3DAYS TD PT72: 1.0000 | MEDICATED_PATCH | TRANSDERMAL | Status: DC

## 2021-12-29 MED ORDER — DEXAMETHASONE SODIUM PHOSPHATE 10 MG/ML IJ SOLN
INTRAMUSCULAR | Status: AC
  Filled 2021-12-29: qty 1

## 2021-12-29 MED ORDER — ONDANSETRON HCL 4 MG/2ML IJ SOLN
INTRAMUSCULAR | Status: AC
  Filled 2021-12-29: qty 2

## 2021-12-29 MED ORDER — CHLORHEXIDINE GLUCONATE CLOTH 2 % EX PADS: 6.0000 | MEDICATED_PAD | Freq: Once | CUTANEOUS | Status: DC

## 2021-12-29 MED ORDER — LACTATED RINGERS IV SOLN: INTRAVENOUS | Status: DC

## 2021-12-29 MED ORDER — LACTATED RINGERS IV SOLN
INTRAVENOUS | Status: DC | PRN
  Administered 2021-12-29: 1000 mL

## 2021-12-29 MED ORDER — CELECOXIB 200 MG PO CAPS
200.0000 mg | ORAL_CAPSULE | Freq: Once | ORAL | Status: AC
Start: 2021-12-29 — End: 2021-12-29

## 2021-12-29 MED ORDER — MIDAZOLAM HCL 2 MG/2ML IJ SOLN
INTRAMUSCULAR | Status: DC | PRN
  Administered 2021-12-29: 2 mg via INTRAVENOUS

## 2021-12-29 MED ORDER — EPHEDRINE SULFATE-NACL 50-0.9 MG/10ML-% IV SOSY
PREFILLED_SYRINGE | INTRAVENOUS | Status: DC | PRN
  Administered 2021-12-29: 5 mg via INTRAVENOUS

## 2021-12-29 MED ORDER — BUPIVACAINE HCL (PF) 0.25 % IJ SOLN
INTRAMUSCULAR | Status: AC
  Filled 2021-12-29: qty 30

## 2021-12-29 MED ORDER — DEXAMETHASONE SODIUM PHOSPHATE 10 MG/ML IJ SOLN
INTRAMUSCULAR | Status: DC | PRN
  Administered 2021-12-29: 4 mg via INTRAVENOUS

## 2021-12-29 MED ORDER — LIDOCAINE 2% (20 MG/ML) 5 ML SYRINGE
INTRAMUSCULAR | Status: DC | PRN
  Administered 2021-12-29: 100 mg via INTRAVENOUS

## 2021-12-29 MED ORDER — ORAL CARE MOUTH RINSE: 15.0000 mL | Freq: Once | OROMUCOSAL | Status: AC

## 2021-12-29 MED ORDER — LIDOCAINE 2% (20 MG/ML) 5 ML SYRINGE
INTRAMUSCULAR | Status: AC
  Filled 2021-12-29: qty 5

## 2021-12-29 MED ORDER — CHLORHEXIDINE GLUCONATE 0.12 % MT SOLN
15.0000 mL | Freq: Once | OROMUCOSAL | Status: AC
  Administered 2021-12-29: 15 mL via OROMUCOSAL
  Filled 2021-12-29: qty 15

## 2021-12-29 MED ORDER — HYDROMORPHONE HCL 1 MG/ML IJ SOLN
INTRAMUSCULAR | Status: DC | PRN
  Administered 2021-12-29 (×2): .5 mg via INTRAVENOUS

## 2021-12-29 MED ORDER — CEFAZOLIN SODIUM-DEXTROSE 2-4 GM/100ML-% IV SOLN
2.0000 g | INTRAVENOUS | Status: AC
  Administered 2021-12-29: 2 g via INTRAVENOUS
  Filled 2021-12-29: qty 100

## 2021-12-29 MED ORDER — SCOPOLAMINE 1 MG/3DAYS TD PT72
MEDICATED_PATCH | TRANSDERMAL | Status: AC
  Administered 2021-12-29: 1.5 mg via TRANSDERMAL
  Filled 2021-12-29: qty 1

## 2021-12-29 MED ORDER — EPINEPHRINE PF 1 MG/ML IJ SOLN
INTRAMUSCULAR | Status: AC
  Filled 2021-12-29: qty 1

## 2021-12-29 MED ORDER — SUGAMMADEX SODIUM 200 MG/2ML IV SOLN
INTRAVENOUS | Status: DC | PRN
  Administered 2021-12-29: 100 mg via INTRAVENOUS
  Administered 2021-12-29: 200 mg via INTRAVENOUS

## 2021-12-29 MED ORDER — ROCURONIUM BROMIDE 10 MG/ML (PF) SYRINGE
PREFILLED_SYRINGE | INTRAVENOUS | Status: DC | PRN
  Administered 2021-12-29: 100 mg via INTRAVENOUS
  Administered 2021-12-29: 20 mg via INTRAVENOUS

## 2021-12-29 MED ORDER — PROMETHAZINE HCL 25 MG/ML IJ SOLN: 6.2500 mg | INTRAMUSCULAR | Status: DC | PRN

## 2021-12-29 MED ORDER — MIDAZOLAM HCL 2 MG/2ML IJ SOLN
INTRAMUSCULAR | Status: AC
Start: 2021-12-29 — End: ?
  Filled 2021-12-29: qty 2

## 2021-12-29 MED ORDER — ROCURONIUM BROMIDE 10 MG/ML (PF) SYRINGE
PREFILLED_SYRINGE | INTRAVENOUS | Status: AC
  Filled 2021-12-29: qty 20

## 2021-12-29 MED ORDER — FENTANYL CITRATE (PF) 250 MCG/5ML IJ SOLN
INTRAMUSCULAR | Status: AC
  Filled 2021-12-29: qty 5

## 2021-12-29 MED ORDER — CELECOXIB 200 MG PO CAPS
ORAL_CAPSULE | ORAL | Status: AC
  Administered 2021-12-29: 200 mg via ORAL
  Filled 2021-12-29: qty 1

## 2021-12-29 MED ORDER — 0.9 % SODIUM CHLORIDE (POUR BTL) OPTIME
TOPICAL | Status: DC | PRN
  Administered 2021-12-29: 1000 mL

## 2021-12-29 MED ORDER — OXYCODONE HCL 5 MG PO TABS
5.0000 mg | ORAL_TABLET | Freq: Once | ORAL | Status: AC
  Administered 2021-12-29: 5 mg via ORAL

## 2021-12-29 MED ORDER — ACETAMINOPHEN 500 MG PO TABS
ORAL_TABLET | ORAL | Status: AC
  Administered 2021-12-29: 1000 mg via ORAL
  Filled 2021-12-29: qty 2

## 2021-12-29 MED ORDER — AMISULPRIDE (ANTIEMETIC) 5 MG/2ML IV SOLN
10.0000 mg | Freq: Once | INTRAVENOUS | Status: DC | PRN
Start: 2021-12-29 — End: 2021-12-29

## 2021-12-29 MED ORDER — PROPOFOL 500 MG/50ML IV EMUL
INTRAVENOUS | Status: DC | PRN
  Administered 2021-12-29: 20 ug/kg/min via INTRAVENOUS

## 2021-12-29 MED ORDER — ONDANSETRON HCL 4 MG/2ML IJ SOLN
INTRAMUSCULAR | Status: DC | PRN
  Administered 2021-12-29: 4 mg via INTRAVENOUS

## 2021-12-29 MED ORDER — ACETAMINOPHEN 500 MG PO TABS
1000.0000 mg | ORAL_TABLET | Freq: Once | ORAL | Status: AC
Start: 2021-12-29 — End: 2021-12-29
  Filled 2021-12-29: qty 2

## 2021-12-29 SURGICAL SUPPLY — 72 items
APL PRP STRL LF DISP 70% ISPRP (MISCELLANEOUS) ×2
APL SKNCLS STERI-STRIP NONHPOA (GAUZE/BANDAGES/DRESSINGS) ×2
BAG COUNTER SPONGE SURGICOUNT (BAG) IMPLANT
BAG SPNG CNTER NS LX DISP (BAG)
BENZOIN TINCTURE PRP APPL 2/3 (GAUZE/BANDAGES/DRESSINGS) ×4 IMPLANT
BIOPATCH RED 1 DISK 7.0 (GAUZE/BANDAGES/DRESSINGS) ×4 IMPLANT
BLADE SURG 10 STRL SS (BLADE) ×16 IMPLANT
BLADE SURG 15 STRL LF DISP TIS (BLADE) ×2 IMPLANT
BLADE SURG 15 STRL SS (BLADE) ×8
BNDG GAUZE ELAST 4 BULKY (GAUZE/BANDAGES/DRESSINGS) ×4 IMPLANT
CHLORAPREP W/TINT 26 (MISCELLANEOUS) ×4 IMPLANT
CLSR STERI-STRIP ANTIMIC 1/2X4 (GAUZE/BANDAGES/DRESSINGS) ×4 IMPLANT
DEVICE DSSCT PLSMBLD 3.0S LGHT (MISCELLANEOUS) IMPLANT
DRAIN CHANNEL 15F RND FF W/TCR (WOUND CARE) ×4 IMPLANT
DRAPE HALF SHEET 40X57 (DRAPES) ×4 IMPLANT
DRAPE LAPAROSCOPIC ABDOMINAL (DRAPES) ×2 IMPLANT
DRESSING MEPILEX FLEX 4X4 (GAUZE/BANDAGES/DRESSINGS) ×2 IMPLANT
DRSG MEPILEX BORDER 4X12 (GAUZE/BANDAGES/DRESSINGS) ×4 IMPLANT
DRSG MEPILEX FLEX 4X4 (GAUZE/BANDAGES/DRESSINGS) ×4
DRSG MEPILEX SACRM 8.7X9.8 (GAUZE/BANDAGES/DRESSINGS) ×2 IMPLANT
DRSG MEPITEL 4X7.2 (GAUZE/BANDAGES/DRESSINGS) IMPLANT
DRSG MEPITEL 8X12 (GAUZE/BANDAGES/DRESSINGS) IMPLANT
DRSG PAD ABDOMINAL 8X10 ST (GAUZE/BANDAGES/DRESSINGS) ×4 IMPLANT
ELECT BLADE 4.0 EZ CLEAN MEGAD (MISCELLANEOUS)
ELECT REM PT RETURN 9FT ADLT (ELECTROSURGICAL) ×2
ELECTRODE BLDE 4.0 EZ CLN MEGD (MISCELLANEOUS) IMPLANT
ELECTRODE REM PT RTRN 9FT ADLT (ELECTROSURGICAL) ×1 IMPLANT
EVACUATOR SILICONE 100CC (DRAIN) ×4 IMPLANT
GLOVE BIO SURGEON STRL SZ 6.5 (GLOVE) IMPLANT
GLOVE BIO SURGEON STRL SZ7.5 (GLOVE) IMPLANT
GLOVE BIOGEL M STRL SZ7.5 (GLOVE) ×2 IMPLANT
GLOVE BIOGEL PI IND STRL 8 (GLOVE) IMPLANT
GLOVE BIOGEL PI INDICATOR 8 (GLOVE)
GLOVE ECLIPSE 6.5 STRL STRAW (GLOVE) IMPLANT
GLOVE SURG UNDER POLY LF SZ7.5 (GLOVE) IMPLANT
GOWN STRL REUS W/ TWL LRG LVL3 (GOWN DISPOSABLE) ×2 IMPLANT
GOWN STRL REUS W/TWL LRG LVL3 (GOWN DISPOSABLE) ×4
GOWN STRL REUS W/TWL XL LVL3 (GOWN DISPOSABLE) ×2 IMPLANT
KIT BASIN OR (CUSTOM PROCEDURE TRAY) ×2 IMPLANT
MARKER SKIN DUAL TIP RULER LAB (MISCELLANEOUS) ×2 IMPLANT
NDL HYPO 25GX1X1/2 BEV (NEEDLE) IMPLANT
NDL SAFETY ECLIPSE 18X1.5 (NEEDLE) IMPLANT
NDL SPNL 18GX3.5 QUINCKE PK (NEEDLE) ×1 IMPLANT
NEEDLE HYPO 18GX1.5 SHARP (NEEDLE)
NEEDLE HYPO 25GX1X1/2 BEV (NEEDLE) IMPLANT
NEEDLE SPNL 18GX3.5 QUINCKE PK (NEEDLE) ×2 IMPLANT
NS IRRIG 1000ML POUR BTL (IV SOLUTION) ×2 IMPLANT
PACK GENERAL/GYN (CUSTOM PROCEDURE TRAY) ×2 IMPLANT
PENCIL SMOKE EVACUATOR (MISCELLANEOUS) IMPLANT
PIN SAFETY STERILE (MISCELLANEOUS) ×2 IMPLANT
PLASMABLADE 3.0S W/LIGHT (MISCELLANEOUS) ×2
SPONGE T-LAP 18X18 ~~LOC~~+RFID (SPONGE) ×6 IMPLANT
STAPLER INSORB 30 2030 C-SECTI (MISCELLANEOUS) ×4 IMPLANT
STAPLER VISISTAT 35W (STAPLE) ×3 IMPLANT
STRIP CLOSURE SKIN 1/2X4 (GAUZE/BANDAGES/DRESSINGS) ×6 IMPLANT
SUT MNCRL AB 3-0 PS2 18 (SUTURE) ×3 IMPLANT
SUT MNCRL AB 4-0 PS2 18 (SUTURE) ×4 IMPLANT
SUT MON AB 5-0 PS2 18 (SUTURE) ×2 IMPLANT
SUT PDS AB 3-0 SH 27 (SUTURE) ×9 IMPLANT
SUT PDS AB 4-0 SH 27 (SUTURE) ×8 IMPLANT
SUT SILK 2 0 SH (SUTURE) ×4 IMPLANT
SUT VIC AB 2-0 SH 27 (SUTURE) ×6
SUT VIC AB 2-0 SH 27XBRD (SUTURE) ×2 IMPLANT
SUT VLOC 90 3-0 CLR P12 (SUTURE) ×5 IMPLANT
SUT VLOC 90 P-14 23 (SUTURE) ×2 IMPLANT
SYR 50ML LL SCALE MARK (SYRINGE) ×2 IMPLANT
SYR CONTROL 10ML LL (SYRINGE) IMPLANT
TOWEL GREEN STERILE FF (TOWEL DISPOSABLE) ×2 IMPLANT
TRAY FOLEY W/BAG SLVR 16FR (SET/KITS/TRAYS/PACK)
TRAY FOLEY W/BAG SLVR 16FR ST (SET/KITS/TRAYS/PACK) IMPLANT
TUBING INFILTRATION IT-10001 (TUBING) ×2 IMPLANT
UNDERPAD 30X36 HEAVY ABSORB (UNDERPADS AND DIAPERS) ×4 IMPLANT

## 2021-12-29 NOTE — Anesthesia Postprocedure Evaluation (Signed)
Anesthesia Post Note  Patient: Carla Rogers  Procedure(s) Performed: MAMMARY REDUCTION  (BREAST) (Bilateral: Breast)     Patient location during evaluation: PACU Anesthesia Type: General Level of consciousness: sedated Pain management: pain level controlled Vital Signs Assessment: post-procedure vital signs reviewed and stable Respiratory status: spontaneous breathing and respiratory function stable Cardiovascular status: stable Postop Assessment: no apparent nausea or vomiting Anesthetic complications: no   No notable events documented.  Last Vitals:  Vitals:   12/29/21 1523 12/29/21 1538  BP: (!) 145/90 137/89  Pulse: 89 80  Resp: 12 17  Temp:  36.4 C  SpO2: 95% 98%    Last Pain:  Vitals:   12/29/21 1538  TempSrc:   PainSc: 4                  Tresha Muzio DANIEL

## 2021-12-29 NOTE — Op Note (Signed)
Operative Note   DATE OF OPERATION: 12/29/2021  LOCATION: [facility]   SURGICAL DEPARTMENT: Plastic Surgery  PREOPERATIVE DIAGNOSES: Bilateral symptomatic macromastia.  POSTOPERATIVE DIAGNOSES:  same  PROCEDURE: Bilateral breast reduction with superomedial pedicle.  SURGEON: [name], MD  ASSISTANT: Evelena Leyden, PA  ANESTHESIA: General.  COMPLICATIONS: None.   INDICATIONS FOR PROCEDURE:  The patient, Carla Rogers is a 46 y.o. female born on 1975-10-28, is here for treatment of bilateral symptomatic macromastia. MRN: [id number]  CONSENT:  Informed consent was obtained directly from the patient. Risks, benefits and alternatives were fully discussed. Specific risks including but not limited to bleeding, infection, hematoma, seroma, scarring, pain, infection, contracture, asymmetry, wound healing problems, and need for further surgery were all discussed. The patient did have an ample opportunity to have questions answered to satisfaction.   DESCRIPTION OF PROCEDURE:  The patient was marked preoperatively for a Wise pattern skin excision.  The patient was taken to the operating room. SCDs were placed and antibiotics were given. General anesthesia was administered.The patient's operative site was prepped and draped in a sterile fashion. A time out was performed and all information was confirmed to be correct.  Right Breast: The breast was infiltrated with tumescent solution to help with hemostasis.  The nipple was marked with a cookie cutter.  A superomedial pedicle was drawn out with the base of at least 8 cm in size.  A breast tourniquet was then applied and the pedicle was de-epithelialized.  Breast tourniquet was then let down and all incisions were made with a 10 blade.  The pedicle was then isolated down to the chest wall with cautery and the excision was performed removing tissue primarily inferiorly and laterally.  Hemostasis was obtained and the wound was  stapled closed.  Left breast:  The breast was infiltrated with tumescent solution to help with hemostasis.  The nipple was marked with a cookie cutter.  A superomedial pedicle was drawn out with the base of at least 8 cm in size.  A breast tourniquet was then applied and the pedicle was de-epithelialized.  Breast tourniquet was then let down and all incisions were made with a 10 blade.  The pedicle was then isolated down to the chest wall with cautery and the excision was performed removing tissue primarily inferiorly and laterally.  Hemostasis was obtained and the wound was stapled closed.  Patient was then set up to check for size and symmetry.  Minor modifications were made.  This resulted in a total of 752 g removed from the right side and 652 g removed from the left side.  The inframammary incision was closed with a combination of buried in-sorb staples, 3-0 PDS and a running V-lock 90.  The vertical and periareolar limbs were closed with interrupted buried 4-0 PDS and a running V-lock 90 suture.  Steri-Strips were then applied along with a soft dressing and breast binder.  The advanced practice practitioner (APP) assisted throughout the case.  The APP was essential in retraction and counter traction when needed to make the case progress smoothly.  This retraction and assistance made it possible to see the tissue planes for the procedure.  The assistance was needed for hemostasis, tissue re-approximation and closure of the incision site.    The patient tolerated the procedure well.  There were no complications. The patient was allowed to wake from anesthesia, extubated and taken to the recovery room in satisfactory condition.  I was present for the entire procedure.

## 2021-12-29 NOTE — Discharge Instructions (Addendum)
Activity: Avoid strenuous activity.  No lifting, pushing, or pulling greater than 15 pounds.  Diet: No restrictions.  Try to optimize nutrition with plenty of proteins, fruits, and vegetables to improve healing.   Wound Care: Leave breast binder on for three days. Then take it off, remove the ABD pads and medipore tape. If you need to replace for drainage, please use ABD pads or gauze.  You may then shower normally. Leave the Steri-Strips in place. After a few days, you may transition to a front-clipping/front-zipping compressive sports bra. You may continue to use the breast binder if that is preferred.  If you have drains placed, be sure to record the daily output from each side. They will be removed at your 1 or 2 week post-op visit, depending on your exam.   Follow-Up: Scheduled for next week.  Things to watch for:  Call the office if you experience fever, chills, intractable vomiting, or significant bleeding.  Mild wound drainage is common after breast reduction surgery and should not be cause for alarm.

## 2021-12-29 NOTE — Interval H&P Note (Signed)
History and Physical Interval Note:  12/29/2021 10:45 AM  Carla Rogers  has presented today for surgery, with the diagnosis of Breast Hypertrophy.  The various methods of treatment have been discussed with the patient and family. After consideration of risks, benefits and other options for treatment, the patient has consented to  Procedure(s): MAMMARY REDUCTION  (BREAST) (Bilateral) as a surgical intervention.  The patient's history has been reviewed, patient examined, no change in status, stable for surgery.  I have reviewed the patient's chart and labs.  Questions were answered to the patient's satisfaction.     [name]

## 2021-12-29 NOTE — Transfer of Care (Signed)
Immediate Anesthesia Transfer of Care Note  Patient: Carla Rogers  Procedure(s) Performed: MAMMARY REDUCTION  (BREAST) (Bilateral: Breast)  Patient Location: PACU  Anesthesia Type:General  Level of Consciousness: drowsy, patient cooperative and responds to stimulation  Airway & Oxygen Therapy: Patient Spontanous Breathing  Post-op Assessment: Report given to RN and Post -op Vital signs reviewed and stable  Post vital signs: Reviewed and stable  Last Vitals:  Vitals Value Taken Time  BP 165/101 12/29/21 1425  Temp    Pulse 90 12/29/21 1428  Resp    SpO2 96 % 12/29/21 1428  Vitals shown include unvalidated device data.  Last Pain:  Vitals:   12/29/21 0912  TempSrc: Oral         Complications: No notable events documented.

## 2021-12-29 NOTE — Anesthesia Procedure Notes (Signed)
Procedure Name: Intubation Date/Time: 12/29/2021 11:26 AM Performed by: Janace Litten, CRNA Pre-anesthesia Checklist: Patient identified, Emergency Drugs available, Suction available and Patient being monitored Patient Re-evaluated:Patient Re-evaluated prior to induction Oxygen Delivery Method: Circle System Utilized Preoxygenation: Pre-oxygenation with 100% oxygen Induction Type: IV induction Ventilation: Mask ventilation without difficulty Laryngoscope Size: Mac and 3 Grade View: Grade I Tube type: Oral Tube size: 7.0 mm Number of attempts: 1 Airway Equipment and Method: Stylet Placement Confirmation: ETT inserted through vocal cords under direct vision, positive ETCO2 and breath sounds checked- equal and bilateral Secured at: 20 cm Tube secured with: Tape Dental Injury: Teeth and Oropharynx as per pre-operative assessment

## 2021-12-30 ENCOUNTER — Encounter (HOSPITAL_COMMUNITY): Payer: Self-pay | Admitting: Plastic Surgery

## 2021-12-31 LAB — SURGICAL PATHOLOGY

## 2022-01-07 ENCOUNTER — Ambulatory Visit (INDEPENDENT_AMBULATORY_CARE_PROVIDER_SITE_OTHER): Payer: BC Managed Care – PPO | Admitting: Plastic Surgery

## 2022-01-07 ENCOUNTER — Telehealth: Payer: Self-pay | Admitting: Plastic Surgery

## 2022-01-07 DIAGNOSIS — N62 Hypertrophy of breast: Secondary | ICD-10-CM

## 2022-01-07 NOTE — Telephone Encounter (Signed)
Patient called in with questions regarding the steristrips and what pain medication was being prescribed for her post-op.   She attempted to call into the nurse line but the line stated to dial an ext and would not let her leave a voicemail.

## 2022-01-07 NOTE — Telephone Encounter (Signed)
Called pt, na, left detailed vm with cb #. Adv that I would send Dr. Domenica Reamer and his PA, Gerre Pebbles a message as Dr. Domenica Reamer was away on a trauma call and Gerre Pebbles was in a training.   Also adv that steri strips will fall off on their own but if they start to lift or hang, she can clip them with scissors and take them off if they don't stick back on.

## 2022-01-08 NOTE — Progress Notes (Signed)
S/p bilateral breast reduction Happy with initial result   PE Incision c/d/I  A/P Dong well, will follow up for recheck.

## 2022-01-10 ENCOUNTER — Telehealth: Payer: Self-pay | Admitting: *Deleted

## 2022-01-10 NOTE — Telephone Encounter (Signed)
Rx faxed to [facility] with demographics and insurance info. Fax confirmation received. Original rx sent to batch scan

## 2022-01-20 NOTE — Progress Notes (Signed)
Patient is a pleasant 46 year old female with PMH of macromastia s/p bilateral breast reduction performed 12/29/2021 by Dr. Domenica Reamer who presents to clinic for postoperative follow-up.  Patient was seen in clinic on 01/07/2022 for initial postop, exam was entirely reassuring.   Today, patient is doing well.  She states that she will occasionally experience sharp, electrical type discomfort that is fleeting, and described as less than 5 minutes duration.  She describes small area of firmness on left breast.  She also describes a strange sensation when consuming warm liquids in the morning and reports swelling and tenderness at IV insertion site right forearm.  Otherwise, doing well.  Pain comes and goes.  She is taking Mobic.  Lastly, patient is mildly bothered by excess axillary tissue bilaterally.  Physical exam is entirely reassuring.  Breasts are soft.  Excellent shape and symmetry.  Small amount of excess axillary tissue bilaterally, symmetric appearing.  Steri-Strips removed, incisions CDI.  Small less than 1 cm wound at right breast superior T-zone and similar size wound at medial aspect left NAC.  NAC's are viable.  No subcutaneous fluid collections noted.  No drainage.  Recommending Vaseline and gauze over small wounds, changed daily.  She likely had thrombophlebitis right forearm, recommending continued Mobic and warm compresses if needed.  It does not appear to be considerable on exam.  Continue activity modifications and compressive garments.  Return in 3 weeks.  Picture(s) obtained of the patient and placed in the chart were with the patient's or guardian's permission.

## 2022-01-21 ENCOUNTER — Ambulatory Visit (INDEPENDENT_AMBULATORY_CARE_PROVIDER_SITE_OTHER): Payer: BC Managed Care – PPO | Admitting: Physician Assistant

## 2022-01-21 DIAGNOSIS — Z9889 Other specified postprocedural states: Secondary | ICD-10-CM

## 2022-02-10 NOTE — Progress Notes (Signed)
Patient is a 46 year old female with history of bilateral symptomatic macromastia.  She underwent bilateral breast reduction with Dr. Domenica Reamer on 12/29/2021.  Patient presents for postoperative follow-up.  Patient was last seen in the clinic on 01/21/2022.  At this visit, she was doing well.  She did report some tingling pain and an area of firmness to the left breast.  Her physical exam was entirely reassuring.  Her incisions were clean dry and intact.  There is a small 1 cm wound at the right breast superior T-zone and a similar sized wound at the medial aspect of the left NAC.  Recommended patient place Vaseline and gauze over the wounds, and continue compressive garments.  Today, patient reports she is doing well.  She states she still has small wounds to the left breast.  She also states that she feels some sutures are poking out and they are bothering her.  Patient also reports some slight pain to the left lateral aspect of her left breast incision.  She denies any fevers or chills.  She denies any other issues at this time.  On exam, patient is sitting upright in no acute distress.  Her breasts are symmetric and soft bilaterally.  NAC's appear viable bilaterally.  To the left breast, there is a small wound to the medial aspect of the NAC.  There is a small V-lock suture knot noted.  This was cut and removed.  Patient tolerated well.  There is also a small superficial wound to the T-zone on the left breast.  There is also a suture noted in this area, this was trimmed and patient tolerated well.  There is no erythema or drainage to either of the wounds. Incision is otherwise intact.  Previous drain site to the left side has healed well.  To the right breast, there is a small wound to the inferior aspect of the NAC.  There was a suture also protruding from this wound, this was trimmed and patient tolerated well.  There is no surrounding erythema or drainage.  Incision is healing well otherwise.  Discussed  with patient that she may apply Vaseline over the wounds daily.  Discussed with the patient to keep the inframammary aspect of her breasts clean and dry.  Discussed with the patient to make sure she pats this area dry after she gets out of the shower.  Discussed with the patient she may take Tylenol or ibuprofen for some of the slight pain she is having.  Discussed with the patient she can now start gradually increasing her activities.  Discussed with the patient to avoid submerging her incisions until the wounds are completely healed.  Discussed with the patient to continue compressive sports bra for few more weeks.  Patient expressed understanding and all of her questions were answered.  Instructed patient to call if she has any questions or concerns.   Patient to follow-up in 1 month.  Pictures were obtained with the patient and placed in the patient's chart with the patient's permission.

## 2022-02-11 ENCOUNTER — Encounter: Payer: Self-pay | Admitting: Student

## 2022-02-11 ENCOUNTER — Encounter: Payer: BC Managed Care – PPO | Admitting: Physician Assistant

## 2022-02-11 ENCOUNTER — Ambulatory Visit (INDEPENDENT_AMBULATORY_CARE_PROVIDER_SITE_OTHER): Payer: BC Managed Care – PPO | Admitting: Student

## 2022-02-11 DIAGNOSIS — Z9889 Other specified postprocedural states: Secondary | ICD-10-CM

## 2022-03-09 ENCOUNTER — Encounter (INDEPENDENT_AMBULATORY_CARE_PROVIDER_SITE_OTHER): Payer: Self-pay

## 2022-03-09 ENCOUNTER — Telehealth: Payer: Self-pay

## 2022-03-09 NOTE — Telephone Encounter (Signed)
Faxed back signed orders for mastectomy garments. Received confirmation fax.

## 2022-03-25 ENCOUNTER — Other Ambulatory Visit (HOSPITAL_COMMUNITY): Payer: Self-pay

## 2022-03-25 MED ORDER — MOUNJARO 10 MG/0.5ML ~~LOC~~ SOAJ
SUBCUTANEOUS | 4 refills | Status: AC
  Filled 2022-03-25: qty 2, 28d supply, fill #0
  Filled 2022-04-18: qty 2, 28d supply, fill #1
  Filled 2022-05-16 – 2022-10-21 (×2): qty 2, 28d supply, fill #2
  Filled 2022-11-21 – 2022-12-07 (×2): qty 2, 28d supply, fill #3

## 2022-03-26 ENCOUNTER — Other Ambulatory Visit (HOSPITAL_COMMUNITY): Payer: Self-pay

## 2022-03-28 ENCOUNTER — Other Ambulatory Visit (HOSPITAL_COMMUNITY): Payer: Self-pay

## 2022-03-31 ENCOUNTER — Encounter: Payer: Self-pay | Admitting: Physical Therapy

## 2022-03-31 ENCOUNTER — Ambulatory Visit: Payer: BC Managed Care – PPO | Attending: Sports Medicine | Admitting: Physical Therapy

## 2022-03-31 ENCOUNTER — Other Ambulatory Visit: Payer: Self-pay

## 2022-03-31 DIAGNOSIS — M5459 Other low back pain: Secondary | ICD-10-CM

## 2022-03-31 DIAGNOSIS — M25551 Pain in right hip: Secondary | ICD-10-CM | POA: Diagnosis present

## 2022-03-31 NOTE — Therapy (Signed)
OUTPATIENT PHYSICAL THERAPY LOWER EXTREMITY EVALUATION   Patient Name: Carla Rogers MRN: [id number] DOB:April 01, 1976, 46 y.o., female Today's Date: 03/31/2022   PT End of Session - 03/31/22 0943     Visit Number 1    Number of Visits 8    Date for PT Re-Evaluation 04/28/22    PT Start Time 0815    PT Stop Time 0906    PT Time Calculation (min) 51 min             Past Medical History:  Diagnosis Date   ADHD    Anxiety    Back pain    Complication of anesthesia    slow to wake up    Depression    Diabetes mellitus without complication (HCC)    GERD (gastroesophageal reflux disease)    Hyperlipidemia    Hypertension    Joint pain    Knee pain    Multiple food allergies    (red onions- Headaches)   Osteoarthritis    Overactive bladder    Palpitations    PONV (postoperative nausea and vomiting)    Sleep apnea    cpap    Vitamin D deficiency    Past Surgical History:  Procedure Laterality Date   BREAST REDUCTION SURGERY Bilateral 12/29/2021   Procedure: MAMMARY REDUCTION  (BREAST);  Surgeon: [name], MD;  Location: [facility];  Service: Plastics;  Laterality: Bilateral;   CHOLECYSTECTOMY     COLONOSCOPY     exploratory vaginal surgery      LAPAROSCOPIC GASTRIC SLEEVE RESECTION N/A 03/24/2015   Procedure: LAPAROSCOPIC GASTRIC SLEEVE RESECTION UPPER ENDOSCOPY;  Surgeon: Ovidio Kin, MD;  Location: [facility] ORS;  Service: General;  Laterality: N/A;   LAPAROSCOPY     pilonidal cyst removal      UPPER GI ENDOSCOPY  03/24/2015   Procedure: UPPER GI ENDOSCOPY;  Surgeon: Ovidio Kin, MD;  Location: [facility] ORS;  Service: General;;   Patient Active Problem List   Diagnosis Date Noted   OSA (obstructive sleep apnea)    Morbid obesity (HCC) 03/24/2015      REFERRING PROVIDER: Elinor Dodge MD   REFERRING DIAG:   Other low back pain  Pain in right hip  Rationale for Evaluation and Treatment Rehabilitation  ONSET DATE: Right hip pain since  1/23.  SUBJECTIVE:   SUBJECTIVE STATEMENT: The patient presents to the clinic with c/o right hip pain that has been ongoing since January of 2023.  This correlated to her spending an entire day cleaning her floors.  She has also had some ongoing low back and has seen a Land for this.  Injections she has received have helped.  Her resting pain-level is a 3/10 today.  Rest, ice, medication help decrease pain.  Certain activities, over doing it, standing too long and occasionally sitting too long increases her pain.    PERTINENT HISTORY: DM, left knee surgery, T-12 compression fracture.  PAIN:  Are you having pain?  As above.  PRECAUTIONS: None  WEIGHT BEARING RESTRICTIONS No  FALLS:  Has patient fallen in last 6 months? No  LIVING ENVIRONMENT: Lives with: lives with their spouse Lives in: House/apartment Has following equipment at home: None    PLOF: Independent  PATIENT GOALS Perform ADL's without pain.   OBJECTIVE:   POSTURE: No Significant postural limitations  PALPATION: Tender to palpation over bilateral SIJ's, some tenderness in right low back and right trochanteric region and especially over her right hip flexor musculature.  LOWER EXTREMITY ROM:  Normal  active lumbar flexion and extension and normal right hip range of motion.  LOWER EXTREMITY MMT: Normal LE strength.  LOWER EXTREMITY SPECIAL TESTS:  Equal leg lengths, (-) RT SLR, pain with a right FABER test.      GAIT: WNL.    TODAY'S TREATMENT: IFC at 80-150 Hz on 40% scan x 20 minutes to patient's right LB/HIP region.  Patient tolerated treatment without complaint with normal modality response following removal of modality.   PATIENT EDUCATION:  We discussed sleeping posture with pillow between knees, use of lumbar roll, avoidance of crossing legs (which reproduce her pain) and correct posture while seated.   ASSESSMENT:  CLINICAL IMPRESSION: The patient presents to OPPT with c/o  right hip pain since January of 2023.  She had reproducible pain with a FABER test.  She also has c/o SIJ pain right > left.  She has palpable tenderness over bilateral SIJ's, right low back, trochanteric region and right hip flexor.  Active lumbar flexion and extension as well as right hip range of motion is normal.  Her LE strength is normal.  We discussed various pain avoidance techniques today.  Patient will benefit from skilled physical therapy intervention to address pain and deficits.   OBJECTIVE IMPAIRMENTS decreased activity tolerance and pain.   ACTIVITY LIMITATIONS sitting and standing  PERSONAL FACTORS Time since onset of injury/illness/exacerbation are also affecting patient's functional outcome.   REHAB POTENTIAL: Excellent  CLINICAL DECISION MAKING: Stable/uncomplicated  EVALUATION COMPLEXITY: Low   GOALS: LONG TERM GOALS: Target date: 04/28/2022   Ind with an HEP. Baseline:  Goal status: INITIAL  2.  Perform ADL's with pain not > 3/10. Baseline:  Goal status: INITIAL    PLAN: PT FREQUENCY: 2x/week  PT DURATION: 4 weeks  PLANNED INTERVENTIONS: Therapeutic exercises, Therapeutic activity, Patient/Family education, Self Care, Dry Needling, Electrical stimulation, Cryotherapy, Moist heat, Ultrasound, and Manual therapy  PLAN FOR NEXT SESSION: Core exercise progression, spine protection and BM techniques.  Modalities and STW/M as needed.   Ethelyne Erich, [place], PT 03/31/2022, 11:13 AM

## 2022-04-05 ENCOUNTER — Ambulatory Visit: Payer: BC Managed Care – PPO | Attending: Sports Medicine | Admitting: Physical Therapy

## 2022-04-05 ENCOUNTER — Other Ambulatory Visit: Payer: Self-pay

## 2022-04-05 DIAGNOSIS — M25551 Pain in right hip: Secondary | ICD-10-CM | POA: Insufficient documentation

## 2022-04-05 DIAGNOSIS — M5459 Other low back pain: Secondary | ICD-10-CM | POA: Diagnosis present

## 2022-04-05 NOTE — Therapy (Signed)
OUTPATIENT PHYSICAL THERAPY LOWER EXTREMITY TREATMENT   Patient Name: Carla Rogers MRN: [id number] DOB:September 11, 1975, 46 y.o., female Today's Date: 04/05/2022   PT End of Session - 04/05/22 0920     Visit Number 2    Number of Visits 8    Date for PT Re-Evaluation 04/28/22    PT Start Time 0815    PT Stop Time 0908    PT Time Calculation (min) 53 min    Activity Tolerance Patient tolerated treatment well    Behavior During Therapy WFL for tasks assessed/performed              Past Medical History:  Diagnosis Date   ADHD    Anxiety    Back pain    Complication of anesthesia    slow to wake up    Depression    Diabetes mellitus without complication (HCC)    GERD (gastroesophageal reflux disease)    Hyperlipidemia    Hypertension    Joint pain    Knee pain    Multiple food allergies    (red onions- Headaches)   Osteoarthritis    Overactive bladder    Palpitations    PONV (postoperative nausea and vomiting)    Sleep apnea    cpap    Vitamin D deficiency    Past Surgical History:  Procedure Laterality Date   BREAST REDUCTION SURGERY Bilateral 12/29/2021   Procedure: MAMMARY REDUCTION  (BREAST);  Surgeon: [name], MD;  Location: [facility] OR;  Service: Plastics;  Laterality: Bilateral;   CHOLECYSTECTOMY     COLONOSCOPY     exploratory vaginal surgery      LAPAROSCOPIC GASTRIC SLEEVE RESECTION N/A 03/24/2015   Procedure: LAPAROSCOPIC GASTRIC SLEEVE RESECTION UPPER ENDOSCOPY;  Surgeon: Ovidio Kin, MD;  Location: [facility];  Service: General;  Laterality: N/A;   LAPAROSCOPY     pilonidal cyst removal      UPPER GI ENDOSCOPY  03/24/2015   Procedure: UPPER GI ENDOSCOPY;  Surgeon: Ovidio Kin, MD;  Location: [facility];  Service: General;;   Patient Active Problem List   Diagnosis Date Noted   OSA (obstructive sleep apnea)    Morbid obesity (HCC) 03/24/2015      REFERRING PROVIDER: Elinor Dodge MD   REFERRING DIAG:   Other low back pain  Pain in right  hip  Rationale for Evaluation and Treatment Rehabilitation  ONSET DATE: Right hip pain since 1/23.  SUBJECTIVE:   SUBJECTIVE STATEMENT: No new complaints. OBJECTIVE:   WNL.    TODAY'S TREATMENT: Right sdly position with pillow between knees for comfort:  U/S at 1.50 W/CM2 x 12 minutes to patient's right SIJ/lateral hip region f/b STW/M x 11 minutes including ischemic release technique f/b IFC at 80-150 Hz on 40% scan x 20 minutes to patient's right LB/HIP region.  Patient tolerated treatment without complaint with normal modality response following removal of modality.   ASSESSMENT:  CLINICAL IMPRESSION: The patient's right TFL was very tender to palpation and very taut.  She tolerated treatment well today.    GOALS: LONG TERM GOALS: Target date: 05/03/2022   Ind with an HEP. Baseline:  Goal status: INITIAL  2.  Perform ADL's with pain not > 3/10. Baseline:  Goal status: INITIAL    PLAN: PT FREQUENCY: 2x/week  PT DURATION: 4 weeks  PLANNED INTERVENTIONS: Therapeutic exercises, Therapeutic activity, Patient/Family education, Self Care, Dry Needling, Electrical stimulation, Cryotherapy, Moist heat, Ultrasound, and Manual therapy  PLAN FOR NEXT SESSION: Core exercise progression, spine protection and  BM techniques.  Modalities and STW/M as needed.   Leighanne Adolph, [place], PT 04/05/2022, 9:22 AM

## 2022-04-18 ENCOUNTER — Other Ambulatory Visit (HOSPITAL_COMMUNITY): Payer: Self-pay

## 2022-05-16 ENCOUNTER — Other Ambulatory Visit (HOSPITAL_COMMUNITY): Payer: Self-pay

## 2022-05-28 ENCOUNTER — Other Ambulatory Visit (HOSPITAL_COMMUNITY): Payer: Self-pay

## 2022-06-02 ENCOUNTER — Other Ambulatory Visit (HOSPITAL_COMMUNITY): Payer: Self-pay

## 2022-06-02 MED ORDER — MOUNJARO 10 MG/0.5ML ~~LOC~~ SOPN
10.0000 mg | PEN_INJECTOR | SUBCUTANEOUS | 4 refills | Status: AC
Start: 2022-06-02 — End: ?
  Filled 2022-06-02: qty 2, 28d supply, fill #0

## 2022-06-03 ENCOUNTER — Other Ambulatory Visit (HOSPITAL_COMMUNITY): Payer: Self-pay

## 2022-06-03 MED ORDER — MOUNJARO 12.5 MG/0.5ML ~~LOC~~ SOAJ
12.5000 mg | SUBCUTANEOUS | 2 refills | Status: DC
Start: 2022-06-03 — End: 2022-09-15
  Filled 2022-06-03: qty 2, 28d supply, fill #0
  Filled 2022-07-12: qty 2, 28d supply, fill #1
  Filled 2022-08-07: qty 2, 28d supply, fill #2

## 2022-06-06 ENCOUNTER — Other Ambulatory Visit (HOSPITAL_COMMUNITY): Payer: Self-pay

## 2022-07-12 ENCOUNTER — Other Ambulatory Visit (HOSPITAL_COMMUNITY): Payer: Self-pay

## 2022-07-27 ENCOUNTER — Other Ambulatory Visit (HOSPITAL_COMMUNITY): Payer: Self-pay

## 2022-09-09 ENCOUNTER — Other Ambulatory Visit (HOSPITAL_COMMUNITY): Payer: Self-pay

## 2022-09-14 ENCOUNTER — Other Ambulatory Visit (HOSPITAL_COMMUNITY): Payer: Self-pay

## 2022-09-15 ENCOUNTER — Other Ambulatory Visit (HOSPITAL_COMMUNITY): Payer: Self-pay

## 2022-09-15 MED ORDER — MOUNJARO 12.5 MG/0.5ML ~~LOC~~ SOAJ
SUBCUTANEOUS | 2 refills | Status: AC
  Filled 2022-09-15 – 2023-01-17 (×2): qty 2, 28d supply, fill #0

## 2022-09-26 ENCOUNTER — Other Ambulatory Visit (HOSPITAL_COMMUNITY): Payer: Self-pay

## 2022-09-28 ENCOUNTER — Other Ambulatory Visit (HOSPITAL_COMMUNITY): Payer: Self-pay

## 2022-09-28 MED ORDER — MOUNJARO 7.5 MG/0.5ML ~~LOC~~ SOAJ
7.5000 mg | SUBCUTANEOUS | 3 refills | Status: DC
Start: 2022-09-28 — End: 2023-01-23
  Filled 2022-09-28 (×2): qty 2, 28d supply, fill #0

## 2022-09-29 ENCOUNTER — Other Ambulatory Visit (HOSPITAL_COMMUNITY): Payer: Self-pay

## 2022-10-21 ENCOUNTER — Other Ambulatory Visit (HOSPITAL_COMMUNITY): Payer: Self-pay

## 2022-10-23 ENCOUNTER — Other Ambulatory Visit: Payer: Self-pay

## 2022-10-25 ENCOUNTER — Other Ambulatory Visit (HOSPITAL_COMMUNITY): Payer: Self-pay

## 2022-10-27 ENCOUNTER — Other Ambulatory Visit (HOSPITAL_COMMUNITY): Payer: Self-pay

## 2022-11-22 ENCOUNTER — Other Ambulatory Visit: Payer: Self-pay

## 2022-11-28 ENCOUNTER — Other Ambulatory Visit (HOSPITAL_COMMUNITY): Payer: Self-pay

## 2022-12-07 ENCOUNTER — Other Ambulatory Visit (HOSPITAL_COMMUNITY): Payer: Self-pay

## 2023-01-17 ENCOUNTER — Other Ambulatory Visit (HOSPITAL_COMMUNITY): Payer: Self-pay

## 2023-04-04 ENCOUNTER — Other Ambulatory Visit (HOSPITAL_COMMUNITY): Payer: Self-pay

## 2023-04-04 MED ORDER — MOUNJARO 5 MG/0.5ML ~~LOC~~ SOAJ
5.0000 mg | SUBCUTANEOUS | 0 refills | Status: DC
Start: 2023-04-04 — End: 2023-05-03
  Filled 2023-04-04: qty 2, 28d supply, fill #0

## 2023-04-05 ENCOUNTER — Other Ambulatory Visit (HOSPITAL_COMMUNITY): Payer: Self-pay

## 2023-05-03 ENCOUNTER — Other Ambulatory Visit (HOSPITAL_COMMUNITY): Payer: Self-pay

## 2023-07-28 ENCOUNTER — Other Ambulatory Visit: Payer: Self-pay | Admitting: Orthopedic Surgery

## 2023-07-31 LAB — SURGICAL PATHOLOGY

## 2024-04-25 ENCOUNTER — Other Ambulatory Visit: Payer: Self-pay | Admitting: Orthopedic Surgery

## 2024-05-16 ENCOUNTER — Encounter (HOSPITAL_BASED_OUTPATIENT_CLINIC_OR_DEPARTMENT_OTHER): Payer: Self-pay | Admitting: Orthopedic Surgery

## 2024-05-21 ENCOUNTER — Encounter (HOSPITAL_BASED_OUTPATIENT_CLINIC_OR_DEPARTMENT_OTHER)
Admission: RE | Admit: 2024-05-21 | Discharge: 2024-05-21 | Disposition: A | Discharge status: Home / Self Care | Source: Ambulatory Visit | Attending: Orthopedic Surgery | Admitting: Orthopedic Surgery

## 2024-05-21 DIAGNOSIS — F32A Depression, unspecified: Secondary | ICD-10-CM | POA: Diagnosis not present

## 2024-05-21 DIAGNOSIS — Z01818 Encounter for other preprocedural examination: Secondary | ICD-10-CM | POA: Insufficient documentation

## 2024-05-21 DIAGNOSIS — I10 Essential (primary) hypertension: Secondary | ICD-10-CM | POA: Diagnosis not present

## 2024-05-21 DIAGNOSIS — E119 Type 2 diabetes mellitus without complications: Secondary | ICD-10-CM | POA: Diagnosis not present

## 2024-05-21 DIAGNOSIS — F419 Anxiety disorder, unspecified: Secondary | ICD-10-CM | POA: Diagnosis not present

## 2024-05-21 DIAGNOSIS — Z7984 Long term (current) use of oral hypoglycemic drugs: Secondary | ICD-10-CM | POA: Diagnosis not present

## 2024-05-21 DIAGNOSIS — M65311 Trigger thumb, right thumb: Secondary | ICD-10-CM | POA: Diagnosis present

## 2024-05-21 DIAGNOSIS — Z7985 Long-term (current) use of injectable non-insulin antidiabetic drugs: Secondary | ICD-10-CM | POA: Diagnosis not present

## 2024-05-21 LAB — BASIC METABOLIC PANEL WITH GFR
Anion gap: 7 (ref 5–15)
BUN: 9 mg/dL (ref 6–20)
CO2: 27 mmol/L (ref 22–32)
Calcium: 8.9 mg/dL (ref 8.9–10.3)
Chloride: 107 mmol/L (ref 98–111)
Creatinine, Ser: 0.73 mg/dL (ref 0.44–1.00)
GFR, Estimated: >60 mL/min (ref >60)
Glucose, Bld: 89 mg/dL (ref 70–99)
Potassium: 4.6 mmol/L (ref 3.5–5.1)
Sodium: 141 mmol/L (ref 135–145)

## 2024-05-21 NOTE — Progress Notes (Signed)

## 2024-05-24 ENCOUNTER — Other Ambulatory Visit: Payer: Self-pay

## 2024-05-24 ENCOUNTER — Ambulatory Visit (HOSPITAL_BASED_OUTPATIENT_CLINIC_OR_DEPARTMENT_OTHER): Admitting: Anesthesiology

## 2024-05-24 ENCOUNTER — Encounter (HOSPITAL_BASED_OUTPATIENT_CLINIC_OR_DEPARTMENT_OTHER): Payer: Self-pay | Admitting: Orthopedic Surgery

## 2024-05-24 ENCOUNTER — Encounter (HOSPITAL_BASED_OUTPATIENT_CLINIC_OR_DEPARTMENT_OTHER): Admission: RE | Disposition: A | Payer: Self-pay | Source: Home / Self Care | Attending: Orthopedic Surgery

## 2024-05-24 ENCOUNTER — Ambulatory Visit (HOSPITAL_BASED_OUTPATIENT_CLINIC_OR_DEPARTMENT_OTHER)
Admission: RE | Admit: 2024-05-24 | Discharge: 2024-05-24 | Disposition: A | Discharge status: Home / Self Care | Attending: Orthopedic Surgery | Admitting: Orthopedic Surgery

## 2024-05-24 DIAGNOSIS — Z7985 Long-term (current) use of injectable non-insulin antidiabetic drugs: Secondary | ICD-10-CM | POA: Insufficient documentation

## 2024-05-24 DIAGNOSIS — E119 Type 2 diabetes mellitus without complications: Secondary | ICD-10-CM | POA: Insufficient documentation

## 2024-05-24 DIAGNOSIS — F32A Depression, unspecified: Secondary | ICD-10-CM | POA: Insufficient documentation

## 2024-05-24 DIAGNOSIS — M65311 Trigger thumb, right thumb: Secondary | ICD-10-CM | POA: Diagnosis not present

## 2024-05-24 DIAGNOSIS — I10 Essential (primary) hypertension: Secondary | ICD-10-CM | POA: Insufficient documentation

## 2024-05-24 DIAGNOSIS — F419 Anxiety disorder, unspecified: Secondary | ICD-10-CM | POA: Insufficient documentation

## 2024-05-24 DIAGNOSIS — Z7984 Long term (current) use of oral hypoglycemic drugs: Secondary | ICD-10-CM | POA: Insufficient documentation

## 2024-05-24 HISTORY — PX: TRIGGER FINGER RELEASE: SHX641

## 2024-05-24 LAB — POCT PREGNANCY, URINE: Preg Test, Ur: NEGATIVE

## 2024-05-24 LAB — GLUCOSE, CAPILLARY
Glucose-Capillary: 85 mg/dL (ref 70–99)
Glucose-Capillary: 82 mg/dL (ref 70–99)

## 2024-05-24 SURGERY — RELEASE, A1 PULLEY, FOR TRIGGER FINGER
Anesthesia: Monitor Anesthesia Care | Site: Thumb | Laterality: Right

## 2024-05-24 MED ORDER — MIDAZOLAM HCL 2 MG/2ML IJ SOLN
INTRAMUSCULAR | Status: AC
  Filled 2024-05-24: qty 2

## 2024-05-24 MED ORDER — FENTANYL CITRATE (PF) 100 MCG/2ML IJ SOLN
INTRAMUSCULAR | Status: AC
  Filled 2024-05-24: qty 2

## 2024-05-24 MED ORDER — LACTATED RINGERS IV SOLN: INTRAVENOUS | Status: DC

## 2024-05-24 MED ORDER — ACETAMINOPHEN 500 MG PO TABS
ORAL_TABLET | ORAL | Status: AC
  Filled 2024-05-24: qty 2

## 2024-05-24 MED ORDER — PROPOFOL 10 MG/ML IV BOLUS
INTRAVENOUS | Status: DC | PRN
  Administered 2024-05-24: 200 mg via INTRAVENOUS

## 2024-05-24 MED ORDER — CEFAZOLIN SODIUM-DEXTROSE 2-4 GM/100ML-% IV SOLN
INTRAVENOUS | Status: AC
  Filled 2024-05-24: qty 100

## 2024-05-24 MED ORDER — MIDAZOLAM HCL 5 MG/5ML IJ SOLN
INTRAMUSCULAR | Status: DC | PRN
  Administered 2024-05-24: 2 mg via INTRAVENOUS

## 2024-05-24 MED ORDER — BUPIVACAINE HCL (PF) 0.25 % IJ SOLN
INTRAMUSCULAR | Status: DC | PRN
  Administered 2024-05-24: 9 mL

## 2024-05-24 MED ORDER — ACETAMINOPHEN 500 MG PO TABS
1000.0000 mg | ORAL_TABLET | Freq: Once | ORAL | Status: AC
  Administered 2024-05-24: 1000 mg via ORAL

## 2024-05-24 MED ORDER — KETOROLAC TROMETHAMINE 30 MG/ML IJ SOLN
INTRAMUSCULAR | Status: AC
  Filled 2024-05-24: qty 1

## 2024-05-24 MED ORDER — OXYCODONE HCL 5 MG PO TABS
ORAL_TABLET | ORAL | Status: AC
  Filled 2024-05-24: qty 1

## 2024-05-24 MED ORDER — LIDOCAINE HCL (CARDIAC) PF 100 MG/5ML IV SOSY
PREFILLED_SYRINGE | INTRAVENOUS | Status: DC | PRN
  Administered 2024-05-24: 60 mg via INTRAVENOUS

## 2024-05-24 MED ORDER — FENTANYL CITRATE (PF) 100 MCG/2ML IJ SOLN
25.0000 ug | INTRAMUSCULAR | Status: DC | PRN
  Administered 2024-05-24: 25 ug via INTRAVENOUS

## 2024-05-24 MED ORDER — FENTANYL CITRATE (PF) 100 MCG/2ML IJ SOLN
INTRAMUSCULAR | Status: DC | PRN
  Administered 2024-05-24 (×2): 50 ug via INTRAVENOUS

## 2024-05-24 MED ORDER — OXYCODONE HCL 5 MG PO TABS
5.0000 mg | ORAL_TABLET | Freq: Once | ORAL | Status: AC | PRN
  Administered 2024-05-24: 5 mg via ORAL

## 2024-05-24 MED ORDER — AMISULPRIDE (ANTIEMETIC) 5 MG/2ML IV SOLN: 10.0000 mg | Freq: Once | INTRAVENOUS | Status: DC | PRN

## 2024-05-24 MED ORDER — PROPOFOL 500 MG/50ML IV EMUL
INTRAVENOUS | Status: DC | PRN
  Administered 2024-05-24: 200 ug/kg/min via INTRAVENOUS

## 2024-05-24 MED ORDER — OXYCODONE HCL 5 MG/5ML PO SOLN: 5.0000 mg | Freq: Once | ORAL | Status: AC | PRN

## 2024-05-24 MED ORDER — DEXAMETHASONE SOD PHOSPHATE PF 10 MG/ML IJ SOLN
INTRAMUSCULAR | Status: DC | PRN
  Administered 2024-05-24: 5 mg via INTRAVENOUS

## 2024-05-24 MED ORDER — HYDROCODONE-ACETAMINOPHEN 5-325 MG PO TABS: 1.0000 | ORAL_TABLET | Freq: Four times a day (QID) | ORAL | 0 refills | Status: AC | PRN

## 2024-05-24 MED ORDER — CEFAZOLIN SODIUM-DEXTROSE 2-4 GM/100ML-% IV SOLN
2.0000 g | INTRAVENOUS | Status: AC
Start: 2024-05-24 — End: 2024-05-24
  Administered 2024-05-24: 2 g via INTRAVENOUS

## 2024-05-24 MED ORDER — KETOROLAC TROMETHAMINE 30 MG/ML IJ SOLN
30.0000 mg | Freq: Once | INTRAMUSCULAR | Status: AC | PRN
  Administered 2024-05-24: 30 mg via INTRAVENOUS

## 2024-05-24 MED ORDER — 0.9 % SODIUM CHLORIDE (POUR BTL) OPTIME
TOPICAL | Status: DC | PRN
  Administered 2024-05-24: 100 mL

## 2024-05-24 MED ORDER — BUPIVACAINE HCL (PF) 0.25 % IJ SOLN
INTRAMUSCULAR | Status: AC
  Filled 2024-05-24: qty 30

## 2024-05-24 SURGICAL SUPPLY — 28 items
BLADE SURG 15 STRL LF DISP TIS (BLADE) ×2 IMPLANT
BNDG COHESIVE 2X5 TAN ST LF (GAUZE/BANDAGES/DRESSINGS) ×1 IMPLANT
BNDG COMPR ESMARK 4X3 LF (GAUZE/BANDAGES/DRESSINGS) IMPLANT
CHLORAPREP W/TINT 26 (MISCELLANEOUS) ×1 IMPLANT
CORD BIPOLAR FORCEPS 12FT (ELECTRODE) ×1 IMPLANT
COVER BACK TABLE 60X90IN (DRAPES) ×1 IMPLANT
COVER MAYO STAND STRL (DRAPES) ×1 IMPLANT
CUFF TOURN SGL QUICK 18X4 (TOURNIQUET CUFF) ×1 IMPLANT
DRAPE EXTREMITY T 121X128X90 (DISPOSABLE) ×1 IMPLANT
DRAPE SURG 17X23 STRL (DRAPES) ×1 IMPLANT
GAUZE SPONGE 4X4 12PLY STRL (GAUZE/BANDAGES/DRESSINGS) ×1 IMPLANT
GAUZE XEROFORM 1X8 LF (GAUZE/BANDAGES/DRESSINGS) ×1 IMPLANT
GLOVE BIO SURGEON STRL SZ7.5 (GLOVE) ×1 IMPLANT
GLOVE BIOGEL PI IND STRL 7.0 (GLOVE) IMPLANT
GLOVE BIOGEL PI IND STRL 7.5 (GLOVE) IMPLANT
GLOVE BIOGEL PI IND STRL 8 (GLOVE) ×1 IMPLANT
GOWN STRL REUS W/ TWL LRG LVL3 (GOWN DISPOSABLE) ×1 IMPLANT
GOWN STRL REUS W/TWL XL LVL3 (GOWN DISPOSABLE) ×1 IMPLANT
NDL HYPO 25X1 1.5 SAFETY (NEEDLE) ×1 IMPLANT
NEEDLE HYPO 25X1 1.5 SAFETY (NEEDLE) ×1 IMPLANT
NS IRRIG 1000ML POUR BTL (IV SOLUTION) ×1 IMPLANT
PACK BASIN DAY SURGERY FS (CUSTOM PROCEDURE TRAY) ×1 IMPLANT
STOCKINETTE 4X48 STRL (DRAPES) ×1 IMPLANT
SUT ETHILON 4 0 PS 2 18 (SUTURE) ×1 IMPLANT
SYR BULB EAR ULCER 3OZ GRN STR (SYRINGE) ×1 IMPLANT
SYR CONTROL 10ML LL (SYRINGE) ×1 IMPLANT
TOWEL GREEN STERILE FF (TOWEL DISPOSABLE) ×2 IMPLANT
UNDERPAD 30X36 HEAVY ABSORB (UNDERPADS AND DIAPERS) ×1 IMPLANT

## 2024-05-24 NOTE — Anesthesia Postprocedure Evaluation (Signed)
 Anesthesia Post Note  Patient: Carla Rogers  Procedure(s) Performed: RELEASE, A1 PULLEY, FOR TRIGGER FINGER (Right: Thumb)     Patient location during evaluation: PACU Anesthesia Type: General Level of consciousness: awake Pain management: pain level controlled Vital Signs Assessment: post-procedure vital signs reviewed and stable Respiratory status: spontaneous breathing, nonlabored ventilation and respiratory function stable Cardiovascular status: blood pressure returned to baseline and stable Postop Assessment: no apparent nausea or vomiting Anesthetic complications: no   No notable events documented.  Last Vitals:  Vitals:   05/24/24 1544 05/24/24 1614  BP:  124/66  Pulse: (!) 56 60  Resp: 13 18  Temp:  36.7 C  SpO2: 96% 99%    Last Pain:  Vitals:   05/24/24 1614  TempSrc: Temporal  PainSc:                  Bernardino SQUIBB Jaiveer Panas

## 2024-05-24 NOTE — Discharge Instructions (Addendum)
 [facility] Instructions Hand Surgery  Wound Care: Keep your hand elevated above the level of your heart.  Do not allow it to dangle by your side.  Keep the dressing dry and do not remove it unless your doctor advises you to do so.  He will usually change it at the time of your post-op visit.  Moving your fingers is advised to stimulate circulation but will depend on the site of your surgery.  If you have a splint applied, your doctor will advise you regarding movement.  Activity: Do not drive or operate machinery today.  Rest today and then you may return to your normal activity and work as indicated by your physician.  Diet:  Drink liquids today or eat a light diet.  You may resume a regular diet tomorrow.    General expectations: Pain for two to three days. Fingers may become slightly swollen.  Call your doctor if any of the following occur: Severe pain not relieved by pain medication. Elevated temperature. Dressing soaked with blood. Inability to move fingers. White or bluish color to fingers.    No Tylenol  before 6:20pm. No ibuprofen before 10:40pm   Post Anesthesia Home Care Instructions  Activity: Get plenty of rest for the remainder of the day. A responsible individual must stay with you for 24 hours following the procedure.  For the next 24 hours, DO NOT: -Drive a car -Advertising copywriter -Drink alcoholic beverages -Take any medication unless instructed by your physician -Make any legal decisions or sign important papers.  Meals: Start with liquid foods such as gelatin or soup. Progress to regular foods as tolerated. Avoid greasy, spicy, heavy foods. If nausea and/or vomiting occur, drink only clear liquids until the nausea and/or vomiting subsides. Call your physician if vomiting continues.  Special Instructions/Symptoms: Your throat may feel dry or sore from the anesthesia or the breathing tube placed in your throat during surgery. If this causes discomfort, gargle  with warm salt water. The discomfort should disappear within 24 hours.

## 2024-05-24 NOTE — Op Note (Signed)
 05/24/2024 [facility] OPERATIVE REPORT   PREOPERATIVE DIAGNOSIS: Right trigger thumb.  POSTOPERATIVE DIAGNOSIS:  Right trigger thumb.  PROCEDURE: Right trigger thumb release.  SURGEON:  Sal Spratley, MD  ASSISTANT:  None.  ANESTHESIA:  General.  IV FLUIDS:  Per anesthesia flow sheet.  ESTIMATED BLOOD LOSS:  Minimal.  COMPLICATIONS:  None.  SPECIMENS:  None.  TOURNIQUET TIME:  Total Tourniquet Time Documented: Upper Arm (Right) - 8 minutes Total: Upper Arm (Right) - 8 minutes   DISPOSITION:  Stable to PACU.  LOCATION: [facility]  INDICATIONS: Carla Rogers is a 48 y.o. female with triggering of the thumb.  This has been injected without lasting resolution.  She wishes to proceed with surgical trigger release.  Risks, benefits and alternatives of surgery were discussed including the risk of blood loss, infection, damage to nerves, vessels, tendons, ligaments, bone, failure of surgery, need for additional surgery, complications with wound healing, continued pain, continued triggering and need for repeat surgery.  She voiced understanding of these risks and elected to proceed.  OPERATIVE COURSE:  After being identified preoperatively by myself, the patient and I agreed upon the procedure and site of procedure.  The surgical site was marked. Surgical consent had been signed. She was given IV Ancef  as preoperative antibiotic prophylaxis. She was transported to the operating room and placed on the operating room table in supine position with the Right upper extremity on an arm board. General anesthesia was induced by the anesthesiologist.  The Right upper extremity was prepped and draped in normal sterile orthopedic fashion. A surgical pause was performed between surgeons, anesthesia, and operating room staff, and all were in agreement as to the patient, procedure, and site of procedure.  Tourniquet at the proximal aspect of the extremity was inflated to  250 mmHg after exsanguination of the arm with an Esmarch bandage.  An incision was made transversely at the MP flexion crease of the thumb.  This was made through the skin only.  Spreading technique was used.  The radial and ulnar digital nerves were identified and were protected throughout the case. The flexor sheath was identified.  The A1 pulley was identified.  It was sharply incised.  It was released in its entirety.  Care was taken to avoid any release of the oblique pulley. The thumb was placed through a range of motion, there was noted to be no catching.  The wound was copiously irrigated with sterile saline. It was then closed with 4-0 nylon in a horizontal mattress fashion.  The wound was injected with  0.25% plain Marcaine  to aid in postoperative analgesia.  It was then dressed with sterile Xeroform, 4x4s, and wrapped lightly with a Coban dressing.  Tourniquet was deflated at 8 minutes.  The fingertips were pink with brisk capillary refill after deflation of the tourniquet.  The operative drapes were broken down and the patient was awoken from anesthesia safely.  She was transferred back to the stretcher and taken to the PACU in stable condition.   I will see her back in the office in 1 week for postoperative followup.  I will give her a prescription for Norco 5/325 1 tab PO q6 hours prn pain, dispense #15.    Chrystine Frogge, MD Electronically signed, 05/24/24

## 2024-05-24 NOTE — Anesthesia Preprocedure Evaluation (Addendum)
 Anesthesia Evaluation  Patient identified by MRN, date of birth, ID band Patient awake    Reviewed: Allergy & Precautions, NPO status , Patient's Chart, lab work & pertinent test results  History of Anesthesia Complications (+) PONV and history of anesthetic complications  Airway Mallampati: II  TM Distance: >3 FB Neck ROM: Full    Dental  (+) Loose,    Pulmonary sleep apnea , former smoker   Pulmonary exam normal        Cardiovascular hypertension, Pt. on medications Normal cardiovascular exam     Neuro/Psych  PSYCHIATRIC DISORDERS Anxiety Depression    negative neurological ROS     GI/Hepatic negative GI ROS, Neg liver ROS,,,  Endo/Other  diabetes, Oral Hypoglycemic Agents  Patient on GLP-1 Agonist. Last dose 10/12  Renal/GU negative Renal ROS     Musculoskeletal  (+) Arthritis ,    Abdominal   Peds  Hematology negative hematology ROS (+)   Anesthesia Other Findings RIGHT TRIGGER THUMB  Reproductive/Obstetrics Hcg negative                              Anesthesia Physical Anesthesia Plan  ASA: 3  Anesthesia Plan: General   Post-op Pain Management:    Induction:   PONV Risk Score and Plan: 4 or greater and Ondansetron , Dexamethasone , Propofol  infusion, Midazolam  and Treatment may vary due to age or medical condition  Airway Management Planned: LMA  Additional Equipment:   Intra-op Plan:   Post-operative Plan: Extubation in OR  Informed Consent: I have reviewed the patients History and Physical, chart, labs and discussed the procedure including the risks, benefits and alternatives for the proposed anesthesia with the patient or authorized representative who has indicated his/her understanding and acceptance.     Dental advisory given  Plan Discussed with: CRNA  Anesthesia Plan Comments:          Anesthesia Quick Evaluation

## 2024-05-24 NOTE — H&P (Signed)
 Carla Rogers is an 48 y.o. female.   Chief Complaint: trigger digit HPI: 48 y.o. yo female with triggering of right thumb.  This has been injected without lasting resolution. She wishes to proceed with surgical trigger release.   Allergies:  Allergies  Allergen Reactions   Pneumococcal Vaccines Shortness Of Breath   Codeine     A codeine based cough syrup caused oversedation    Other     Red onions - migraines    Macrodantin [Nitrofurantoin Macrocrystal] Rash and Other (See Comments)    dizziness   Sulfa Antibiotics Rash    Past Medical History:  Diagnosis Date   ADHD    Anxiety    Back pain    Complication of anesthesia    slow to wake up    Depression    Diabetes mellitus without complication (HCC)    GERD (gastroesophageal reflux disease)    Hyperlipidemia    Hypertension    Joint pain    Knee pain    Multiple food allergies    (red onions- Headaches)   Osteoarthritis    Overactive bladder    Palpitations    PONV (postoperative nausea and vomiting)    Sleep apnea    cpap    Vitamin D  deficiency     Past Surgical History:  Procedure Laterality Date   BREAST REDUCTION SURGERY Bilateral 12/29/2021   Procedure: MAMMARY REDUCTION  (BREAST);  Surgeon: Marene Sieving, MD;  Location: [facility] OR;  Service: Plastics;  Laterality: Bilateral;   CHOLECYSTECTOMY     COLONOSCOPY     exploratory vaginal surgery      LAPAROSCOPIC GASTRIC SLEEVE RESECTION N/A 03/24/2015   Procedure: LAPAROSCOPIC GASTRIC SLEEVE RESECTION UPPER ENDOSCOPY;  Surgeon: Alm Angle, MD;  Location: [facility] ORS;  Service: General;  Laterality: N/A;   LAPAROSCOPY     pilonidal cyst removal      UPPER GI ENDOSCOPY  03/24/2015   Procedure: UPPER GI ENDOSCOPY;  Surgeon: Alm Angle, MD;  Location: [facility];  Service: General;;    Family History: Family History  Problem Relation Age of Onset   Hypertension Mother    Hyperlipidemia Mother    Sudden death Mother    Depression Mother    Diabetes Father     Hypertension Father    Hyperlipidemia Father    Heart disease Father    Stroke Father    Anxiety disorder Father    Sleep apnea Father    Alcoholism Father    Obesity Father     Social History:   reports that she quit smoking about 15 years ago. Her smoking use included cigarettes and e-cigarettes. She started smoking about 30 years ago. She has a 22.5 pack-year smoking history. She has never used smokeless tobacco. She reports current alcohol use. She reports that she does not use drugs.  Medications: Medications Prior to Admission  Medication Sig Dispense Refill   amphetamine-dextroamphetamine (ADDERALL) 20 MG tablet Take 20 mg by mouth 2 (two) times daily.     ascorbic acid (VITAMIN C) 500 MG tablet Take 500 mg by mouth daily.     buPROPion (WELLBUTRIN XL) 300 MG 24 hr tablet Take 300 mg by mouth daily.     cetirizine (ZYRTEC) 10 MG tablet Take 10 mg by mouth daily as needed for allergies.     cholecalciferol (VITAMIN D3) 25 MCG (1000 UNIT) tablet Take 1,000 Units by mouth daily.     escitalopram (LEXAPRO) 20 MG tablet Take 20 mg by mouth every  evening.     LORazepam (ATIVAN) 1 MG tablet Take 1 mg by mouth daily as needed for anxiety or sleep.      losartan-hydrochlorothiazide (HYZAAR) 100-12.5 MG tablet Take 1 tablet by mouth daily.     Magnesium 250 MG TABS Take 250 mg by mouth daily.     Pediatric Multiple Vitamins (CHILDRENS MULTIVITAMIN) chewable tablet Chew 1 tablet by mouth 2 (two) times daily.     rosuvastatin (CRESTOR) 10 MG tablet Take 10 mg by mouth every Monday, Wednesday, and Friday.     solifenacin (VESICARE) 10 MG tablet Take 10 mg by mouth daily.     zinc gluconate 50 MG tablet Take 50 mg by mouth daily.     cyclobenzaprine (FLEXERIL) 10 MG tablet Take 10 mg by mouth 3 (three) times daily as needed for muscle spasms.     docusate sodium (COLACE) 100 MG capsule Take 100-200 mg by mouth daily.     levonorgestrel (LILETTA, 52 MG,) 19.5 MCG/DAY IUD IUD 1 each by  Intrauterine route once.     meloxicam (MOBIC) 15 MG tablet Take 15 mg by mouth daily.     metFORMIN (GLUCOPHAGE) 500 MG tablet Take 500 mg by mouth 2 (two) times daily with a meal.      tirzepatide  (MOUNJARO ) 10 MG/0.5ML Pen Inject 10 mg into the skin once a week 2 mL 4   tirzepatide  (MOUNJARO ) 10 MG/0.5ML Pen Inject 10 mg into the skin once a week. 2 mL 4   tirzepatide  (MOUNJARO ) 12.5 MG/0.5ML Pen Inject 12.5 mg as directed once a week. 6 mL 2   tirzepatide  (MOUNJARO ) 7.5 MG/0.5ML Pen Inject 7.5 mg into the skin every Sunday.      Results for orders placed or performed during the hospital encounter of 05/24/24 (from the past 48 hours)  Pregnancy, urine POC     Status: None   Collection Time: 05/24/24 11:23 AM  Result Value Ref Range   Preg Test, Ur NEGATIVE NEGATIVE    Comment:        THE SENSITIVITY OF THIS METHODOLOGY IS >20 mIU/mL.   Glucose, capillary     Status: None   Collection Time: 05/24/24 11:37 AM  Result Value Ref Range   Glucose-Capillary 85 70 - 99 mg/dL    Comment: Glucose reference range applies only to samples taken after fasting for at least 8 hours.    No results found.    Blood pressure 120/71, pulse 64, temperature 97.7 F (36.5 C), temperature source Temporal, height 5' 1.5 (1.562 m), weight 68 kg, SpO2 100%.  General appearance: alert, cooperative, and appears stated age Head: Normocephalic, without obvious abnormality, atraumatic Neck: supple, symmetrical, trachea midline Extremities: Intact sensation and capillary refill all digits.  +epl/fpl/io.  No wounds.  Skin: Skin color, texture, turgor normal. No rashes or lesions Neurologic: Grossly normal Incision/Wound: none  Assessment/Plan Right thumb trigger digit.  Non operative and operative treatment options have been discussed with the patient and patient wishes to proceed with operative treatment. Risks, benefits and alternatives of surgery were discussed including risks of blood loss, infection,  damage to nerves/vessels/tendons/ligament/bone, failure of surgery, need for additional surgery, complication with wound healing, stiffness, recurrence.  She voiced understanding of these risks and elected to proceed.    Carla Rogers 05/24/2024, 1:09 PM

## 2024-05-24 NOTE — Transfer of Care (Signed)
 Immediate Anesthesia Transfer of Care Note  Patient: Carla Rogers  Procedure(s) Performed: RELEASE, A1 PULLEY, FOR TRIGGER FINGER (Right: Thumb)  Patient Location: PACU  Anesthesia Type:General  Level of Consciousness: awake, alert , and oriented  Airway & Oxygen Therapy: Patient Spontanous Breathing and Patient connected to face mask oxygen  Post-op Assessment: Report given to RN and Post -op Vital signs reviewed and stable  Post vital signs: Reviewed and stable  Last Vitals:  Vitals Value Taken Time  BP 109/51 05/24/24 14:15  Temp    Pulse 66 05/24/24 14:17  Resp 15 05/24/24 14:17  SpO2 100 % 05/24/24 14:17  Vitals shown include unfiled device data.  Last Pain:  Vitals:   05/24/24 1131  TempSrc: Temporal  PainSc: 0-No pain      Patients Stated Pain Goal: 3 (05/24/24 1131)  Complications: No notable events documented.

## 2024-05-24 NOTE — Anesthesia Procedure Notes (Signed)
 Procedure Name: LMA Insertion Date/Time: 05/24/2024 1:37 PM  Performed by: Emilio Rock BIRCH, CRNAPre-anesthesia Checklist: Patient identified, Emergency Drugs available, Suction available and Patient being monitored Patient Re-evaluated:Patient Re-evaluated prior to induction Oxygen Delivery Method: Circle System Utilized Preoxygenation: Pre-oxygenation with 100% oxygen Induction Type: IV induction Ventilation: Mask ventilation without difficulty LMA: LMA inserted LMA Size: 4.0 Number of attempts: 1 Airway Equipment and Method: bite block Placement Confirmation: positive ETCO2 Tube secured with: Tape Dental Injury: Teeth and Oropharynx as per pre-operative assessment

## 2024-05-25 ENCOUNTER — Encounter (HOSPITAL_BASED_OUTPATIENT_CLINIC_OR_DEPARTMENT_OTHER): Payer: Self-pay | Admitting: Orthopedic Surgery
